# Patient Record
Sex: Female | Born: 1970 | Race: White | Hispanic: No | Marital: Married | Smoking: Never smoker
Health system: Southern US, Community
[De-identification: ages and names within clinical notes are randomized; demographics above are authoritative.]

## PROBLEM LIST (undated history)

## (undated) DIAGNOSIS — G43909 Migraine, unspecified, not intractable, without status migrainosus: Secondary | ICD-10-CM

## (undated) DIAGNOSIS — Z87442 Personal history of urinary calculi: Secondary | ICD-10-CM

## (undated) DIAGNOSIS — I10 Essential (primary) hypertension: Secondary | ICD-10-CM

## (undated) DIAGNOSIS — E785 Hyperlipidemia, unspecified: Secondary | ICD-10-CM

## (undated) DIAGNOSIS — R7303 Prediabetes: Secondary | ICD-10-CM

## (undated) DIAGNOSIS — F419 Anxiety disorder, unspecified: Secondary | ICD-10-CM

## (undated) DIAGNOSIS — G4730 Sleep apnea, unspecified: Secondary | ICD-10-CM

## (undated) HISTORY — PX: NO PAST SURGERIES: SHX2092

## (undated) HISTORY — DX: Hyperlipidemia, unspecified: E78.5

## (undated) HISTORY — DX: Migraine, unspecified, not intractable, without status migrainosus: G43.909

## (undated) HISTORY — DX: Essential (primary) hypertension: I10

---

## 2005-12-10 DIAGNOSIS — F419 Anxiety disorder, unspecified: Secondary | ICD-10-CM | POA: Insufficient documentation

## 2005-12-10 DIAGNOSIS — G43109 Migraine with aura, not intractable, without status migrainosus: Secondary | ICD-10-CM | POA: Insufficient documentation

## 2006-06-13 ENCOUNTER — Ambulatory Visit: Payer: Self-pay | Admitting: Family Medicine

## 2008-08-12 ENCOUNTER — Ambulatory Visit: Payer: Self-pay | Admitting: Cardiology

## 2011-03-18 ENCOUNTER — Ambulatory Visit: Payer: Self-pay | Admitting: Family Medicine

## 2011-04-26 ENCOUNTER — Other Ambulatory Visit: Payer: Self-pay | Admitting: Family Medicine

## 2011-12-10 ENCOUNTER — Other Ambulatory Visit: Payer: Self-pay | Admitting: Family Medicine

## 2011-12-10 LAB — COMPREHENSIVE METABOLIC PANEL
Albumin: 3.8 g/dL (ref 3.4–5.0)
Alkaline Phosphatase: 67 U/L (ref 50–136)
Anion Gap: 8 (ref 7–16)
BUN: 15 mg/dL (ref 7–18)
Bilirubin,Total: 0.6 mg/dL (ref 0.2–1.0)
Calcium, Total: 8.2 mg/dL — ABNORMAL LOW (ref 8.5–10.1)
Creatinine: 0.75 mg/dL (ref 0.60–1.30)
EGFR (African American): >60
EGFR (Non-African Amer.): >60
Osmolality: 283 (ref 275–301)
SGOT(AST): 14 U/L — ABNORMAL LOW (ref 15–37)
SGPT (ALT): 20 U/L
Total Protein: 7 g/dL (ref 6.4–8.2)

## 2011-12-10 LAB — CBC WITH DIFFERENTIAL/PLATELET
Eosinophil #: 0.1 10*3/uL (ref 0.0–0.7)
HCT: 39.1 % (ref 35.0–47.0)
Lymphocyte #: 1.7 10*3/uL (ref 1.0–3.6)
MCH: 29.8 pg (ref 26.0–34.0)
Monocyte %: 7.8 %
Neutrophil #: 3.4 10*3/uL (ref 1.4–6.5)
Neutrophil %: 61 %
Platelet: 243 10*3/uL (ref 150–440)
RBC: 4.37 10*6/uL (ref 3.80–5.20)
RDW: 13.6 % (ref 11.5–14.5)

## 2012-05-29 ENCOUNTER — Other Ambulatory Visit: Payer: Self-pay

## 2012-07-24 ENCOUNTER — Ambulatory Visit: Payer: Self-pay | Admitting: Family Medicine

## 2013-09-07 ENCOUNTER — Ambulatory Visit (INDEPENDENT_AMBULATORY_CARE_PROVIDER_SITE_OTHER): Payer: 59 | Admitting: General Surgery

## 2013-09-07 ENCOUNTER — Encounter: Payer: Self-pay | Admitting: General Surgery

## 2013-09-07 VITALS — BP 124/78 | Ht 63.0 in | Wt 156.0 lb

## 2013-09-07 DIAGNOSIS — K644 Residual hemorrhoidal skin tags: Secondary | ICD-10-CM

## 2013-09-07 NOTE — Progress Notes (Signed)
Patient ID: Audrey Koch, female   DOB: 19-Aug-1970, 43 y.o.   MRN: [id number]  Chief Complaint  Patient presents with  . Other    hemorrhoids    HPI Audrey Koch is a 42 y.o. female here today for an evaluation of hemorrhoids. Patient states she noticed this problem about 6 months ago. She states she has been bleeding and itchy/irritating. Pain and swelling has occurred. Occasionally painful during a bowel movement. Patient has used stool suppositories and a Lidocaine/Hydrocortisone cream without success.   HPI  Past Medical History  Diagnosis Date  . Migraine     History reviewed. No pertinent past surgical history.  History reviewed. No pertinent family history.  Social History History  Substance Use Topics  . Smoking status: Never Smoker   . Smokeless tobacco: Never Used  . Alcohol Use: No    Allergies  Allergen Reactions  . Sulfa Antibiotics Rash    Current Outpatient Prescriptions  Medication Sig Dispense Refill  . ibuprofen (ADVIL,MOTRIN) 200 MG tablet Take 200 mg by mouth every 6 (six) hours as needed.      . lidocaine (LMX) 4 % cream Apply 1 application topically as needed.       No current facility-administered medications for this visit.    Review of Systems Review of Systems  Constitutional: Negative.   Respiratory: Negative.   Cardiovascular: Negative.   Gastrointestinal: Positive for anal bleeding and rectal pain. Negative for nausea, vomiting, abdominal pain, diarrhea, constipation, blood in stool and abdominal distention.    Blood pressure 124/78, height 5\' 3"  (1.6 m), weight 156 lb (70.761 kg), last menstrual period 08/30/2013.  Physical Exam Physical Exam  Constitutional: She is oriented to person, place, and time. She appears well-developed and well-nourished.  Eyes: Conjunctivae are normal. No scleral icterus.  Neck: Neck supple.  Cardiovascular: Normal rate, regular rhythm and normal heart sounds.   Pulmonary/Chest: Effort normal and breath  sounds normal.  Abdominal: Soft. Normal appearance and bowel sounds are normal. There is no tenderness.  Genitourinary: Rectal exam shows external hemorrhoid (12 o clock, 2cm long).  Neurological: She is alert and oriented to person, place, and time.  Skin: Skin is warm and dry.    Data Reviewed None  Assessment    Symptomatic external hemorrhoid. Given the large size it has caused considerable problems trying to wipe and keep the area clean.     Plan     Reccommended excision. Discussed fully. At time of surgery anal speculum exam can be completed and if internal hemorrhoids are seen they can be removed also.    Patient to check her schedule and will call the office when ready to arrange.    Rajohn Henery G 09/07/2013, 4:06 PM

## 2013-09-07 NOTE — Patient Instructions (Addendum)
Schedule excision of external hemorrhoid.   Patient to check her schedule and will call the office when ready to arrange.

## 2013-09-27 ENCOUNTER — Telehealth: Payer: Self-pay

## 2013-09-27 ENCOUNTER — Other Ambulatory Visit: Payer: Self-pay | Admitting: General Surgery

## 2013-09-27 DIAGNOSIS — K644 Residual hemorrhoidal skin tags: Secondary | ICD-10-CM

## 2013-09-27 NOTE — Telephone Encounter (Signed)
Patient called to schedule her hemorrhoid surgery. Patient is scheduled for surgery on 10/08/13 at [facility]. She will phone interview with pre admitting testing. She has been given all instructions and is aware of date. She will call with any questions or changes in medications or health.

## 2013-10-08 ENCOUNTER — Ambulatory Visit: Payer: Self-pay | Admitting: General Surgery

## 2013-10-08 DIAGNOSIS — K649 Unspecified hemorrhoids: Secondary | ICD-10-CM

## 2013-10-08 HISTORY — PX: HEMORROIDECTOMY: SUR656

## 2013-10-11 ENCOUNTER — Encounter: Payer: Self-pay | Admitting: General Surgery

## 2013-10-11 LAB — PATHOLOGY REPORT

## 2013-10-13 ENCOUNTER — Encounter: Payer: Self-pay | Admitting: General Surgery

## 2013-10-13 ENCOUNTER — Telehealth: Payer: Self-pay

## 2013-10-13 NOTE — Telephone Encounter (Signed)
She states temp now 99.5. Denies nausea, vomiting, diarrhea, constipation, or a cough.  Advised to use ice to the area evening hours. alteranate tylenol with ibuprofen. She is aware to avoid constipation and take miralax as needed. She will call back if new symptoms arise or worsens.

## 2013-10-13 NOTE — Telephone Encounter (Signed)
Patient called concerned. States that she had hemorrhoid surgery last Friday and has still been having some bloody discharge. She also reports a temperature of 100.3 today.

## 2013-10-19 ENCOUNTER — Encounter: Payer: Self-pay | Admitting: General Surgery

## 2013-10-19 ENCOUNTER — Ambulatory Visit (INDEPENDENT_AMBULATORY_CARE_PROVIDER_SITE_OTHER): Payer: 59 | Admitting: General Surgery

## 2013-10-19 VITALS — BP 118/68 | HR 74 | Resp 12 | Ht 63.0 in | Wt 157.0 lb

## 2013-10-19 DIAGNOSIS — K644 Residual hemorrhoidal skin tags: Secondary | ICD-10-CM

## 2013-10-19 NOTE — Progress Notes (Signed)
This is a 43 year old female here today for her post op external hemorrhoidectomy done on 10/08/13. Patient states she is still having alittle drainage. Rectal exam shows hemorrhoidectomy site at 12o'clock with no signs of infection, swelling, or bleeding.  Overall, good healing.  Follow up in one month.

## 2013-10-19 NOTE — Patient Instructions (Addendum)
Patient to return in one month. 

## 2013-11-18 ENCOUNTER — Ambulatory Visit (INDEPENDENT_AMBULATORY_CARE_PROVIDER_SITE_OTHER): Payer: 59 | Admitting: General Surgery

## 2013-11-18 ENCOUNTER — Encounter: Payer: Self-pay | Admitting: General Surgery

## 2013-11-18 VITALS — BP 148/84 | HR 80 | Resp 12 | Ht 63.0 in | Wt 153.0 lb

## 2013-11-18 DIAGNOSIS — K644 Residual hemorrhoidal skin tags: Secondary | ICD-10-CM

## 2013-11-18 NOTE — Progress Notes (Signed)
This is a 43 year old female here today for her post op external hemorrhoidectomy done on 10/08/13. Patient states the area is not draining. She is overall feeling much better.  Rectal area  is clean and well healed. No residual hemorrhoid noted.  Patient to return as needed.

## 2013-11-18 NOTE — Patient Instructions (Addendum)
Patient to return as needed. 

## 2013-11-20 ENCOUNTER — Encounter: Payer: Self-pay | Admitting: General Surgery

## 2014-05-30 ENCOUNTER — Encounter: Payer: Self-pay | Admitting: General Surgery

## 2014-07-15 ENCOUNTER — Ambulatory Visit: Payer: Self-pay | Admitting: Orthopedic Surgery

## 2014-07-27 ENCOUNTER — Encounter: Payer: Self-pay | Admitting: Orthopedic Surgery

## 2014-07-29 ENCOUNTER — Encounter: Payer: Self-pay | Admitting: Orthopedic Surgery

## 2014-07-29 HISTORY — PX: FINGER FRACTURE SURGERY: SHX638

## 2014-08-29 ENCOUNTER — Encounter: Payer: Self-pay | Admitting: Orthopedic Surgery

## 2014-09-27 ENCOUNTER — Encounter
Admit: 2014-09-27 | Disposition: A | Discharge status: Home / Self Care | Payer: Self-pay | Attending: Orthopedic Surgery | Admitting: Orthopedic Surgery

## 2014-10-28 ENCOUNTER — Encounter
Admit: 2014-10-28 | Disposition: A | Discharge status: Home / Self Care | Payer: Self-pay | Attending: Orthopedic Surgery | Admitting: Orthopedic Surgery

## 2014-11-19 NOTE — Op Note (Signed)
PATIENT NAMEKATERIN, [name] MR#:  [id number] DATE OF BIRTH:  [birth year]  DATE OF PROCEDURE:  10/08/2013  PREOPERATIVE DIAGNOSIS:  External hemorrhoid.   POSTOPERATIVE DIAGNOSIS:  Internal and external hemorrhoid.   OPERATION PERFORMED:  Hemorrhoidectomy, single column, internal and external hemorrhoid.   SURGEON:  Mckinley Jewel, M.D.   ANESTHESIA:  General.   COMPLICATIONS:  None.   ESTIMATED BLOOD LOSS:  Nil.   DRAINS:  None.   DESCRIPTION OF PROCEDURE:  The patient was put to sleep in the supine position on the operating table using an LMA.  The patient was then placed in the lithotomy position and the anal area was prepped and draped out a sterile field.  The patient had a very large external hemorrhoid located at the 12 o'clock position.  Anal exam was completed including the speculum showing no abnormality elsewhere.  This hemorrhoid actually had an internal component to it and had failed to respond to any kind of local therapy.  The area was then infiltrated with 20 mL of 1.3% Exparel and the hemorrhoid was adequately exposed and along the base of this from the outer towards the inner aspect, the LigaSure device was employed serially and then cut and removed.  A complete seal was obtained.  The area was then covered with bacitracin ointment and dressed.  The procedure was well tolerated.  She was subsequently returned to the recovery room in stable condition.    ____________________________ S.Robinette Haines, MD sgs:ea D: 10/08/2013 15:23:05 ET T: 10/08/2013 23:02:32 ET JOB#: [id number]  cc: Synthia Innocent. Jamal Collin, MD, <Dictator> [facility] Robinette Haines MD ELECTRONICALLY SIGNED 10/12/2013 7:15

## 2015-01-11 DIAGNOSIS — L659 Nonscarring hair loss, unspecified: Secondary | ICD-10-CM | POA: Insufficient documentation

## 2015-01-11 DIAGNOSIS — K644 Residual hemorrhoidal skin tags: Secondary | ICD-10-CM | POA: Insufficient documentation

## 2015-01-11 DIAGNOSIS — E559 Vitamin D deficiency, unspecified: Secondary | ICD-10-CM | POA: Insufficient documentation

## 2015-01-11 DIAGNOSIS — Z87442 Personal history of urinary calculi: Secondary | ICD-10-CM | POA: Insufficient documentation

## 2015-01-11 DIAGNOSIS — B351 Tinea unguium: Secondary | ICD-10-CM | POA: Insufficient documentation

## 2015-01-11 DIAGNOSIS — S2249XA Multiple fractures of ribs, unspecified side, initial encounter for closed fracture: Secondary | ICD-10-CM | POA: Insufficient documentation

## 2015-01-11 DIAGNOSIS — Z202 Contact with and (suspected) exposure to infections with a predominantly sexual mode of transmission: Secondary | ICD-10-CM | POA: Insufficient documentation

## 2015-01-11 DIAGNOSIS — R635 Abnormal weight gain: Secondary | ICD-10-CM | POA: Insufficient documentation

## 2015-01-11 DIAGNOSIS — Z79899 Other long term (current) drug therapy: Secondary | ICD-10-CM | POA: Insufficient documentation

## 2015-01-11 DIAGNOSIS — S42009A Fracture of unspecified part of unspecified clavicle, initial encounter for closed fracture: Secondary | ICD-10-CM | POA: Insufficient documentation

## 2015-01-11 DIAGNOSIS — R61 Generalized hyperhidrosis: Secondary | ICD-10-CM | POA: Insufficient documentation

## 2015-02-10 ENCOUNTER — Ambulatory Visit (INDEPENDENT_AMBULATORY_CARE_PROVIDER_SITE_OTHER): Payer: 59 | Admitting: Physician Assistant

## 2015-02-10 ENCOUNTER — Telehealth: Payer: Self-pay | Admitting: Cardiovascular Disease

## 2015-02-10 ENCOUNTER — Encounter: Payer: Self-pay | Admitting: Physician Assistant

## 2015-02-10 VITALS — BP 118/82 | HR 77 | Temp 98.5°F | Resp 16 | Ht 63.0 in | Wt 156.0 lb

## 2015-02-10 DIAGNOSIS — Z0000 Encounter for general adult medical examination without abnormal findings: Secondary | ICD-10-CM

## 2015-02-10 DIAGNOSIS — Z8249 Family history of ischemic heart disease and other diseases of the circulatory system: Secondary | ICD-10-CM

## 2015-02-10 DIAGNOSIS — R9431 Abnormal electrocardiogram [ECG] [EKG]: Secondary | ICD-10-CM

## 2015-02-10 DIAGNOSIS — Z8639 Personal history of other endocrine, nutritional and metabolic disease: Secondary | ICD-10-CM

## 2015-02-10 DIAGNOSIS — Z833 Family history of diabetes mellitus: Secondary | ICD-10-CM

## 2015-02-10 DIAGNOSIS — R079 Chest pain, unspecified: Secondary | ICD-10-CM | POA: Diagnosis not present

## 2015-02-10 DIAGNOSIS — Z1331 Encounter for screening for depression: Secondary | ICD-10-CM

## 2015-02-10 DIAGNOSIS — R002 Palpitations: Secondary | ICD-10-CM | POA: Diagnosis not present

## 2015-02-10 DIAGNOSIS — Z124 Encounter for screening for malignant neoplasm of cervix: Secondary | ICD-10-CM | POA: Diagnosis not present

## 2015-02-10 DIAGNOSIS — Z1389 Encounter for screening for other disorder: Secondary | ICD-10-CM | POA: Diagnosis not present

## 2015-02-10 DIAGNOSIS — Z1239 Encounter for other screening for malignant neoplasm of breast: Secondary | ICD-10-CM

## 2015-02-10 NOTE — Telephone Encounter (Signed)
Sarah at [facility] called to see if patient can be seen any sooner as she is still c/o cp on exertion palpitations and family hx of early age CAD.  Patient wants to ONLY see Dr. Fletcher Anon.  Dr. Fletcher Anon is not in office today and does not have an appt available.  Judson Roch was advised to have the patient go to the emergency room.  Judson Roch would like Dr. Fletcher Anon to look at Patient's EKG.  Please call office or patient if we can do something any sooner.

## 2015-02-10 NOTE — Patient Instructions (Signed)
Health Maintenance Adopting a healthy lifestyle and getting preventive care can go a long way to promote health and wellness. Talk with your health care provider about what schedule of regular examinations is right for you. This is a good chance for you to check in with your provider about disease prevention and staying healthy. In between checkups, there are plenty of things you can do on your own. Experts have done a lot of research about which lifestyle changes and preventive measures are most likely to keep you healthy. Ask your health care provider for more information. WEIGHT AND DIET  Eat a healthy diet 1. Be sure to include plenty of vegetables, fruits, low-fat dairy products, and lean protein. 2. Do not eat a lot of foods high in solid fats, added sugars, or salt. 3. Get regular exercise. This is one of the most important things you can do for your health. 1. Most adults should exercise for at least 150 minutes each week. The exercise should increase your heart rate and make you sweat (moderate-intensity exercise). 2. Most adults should also do strengthening exercises at least twice a week. This is in addition to the moderate-intensity exercise.  Maintain a healthy weight 1. Body mass index (BMI) is a measurement that can be used to identify possible weight problems. It estimates body fat based on height and weight. Your health care provider can help determine your BMI and help you achieve or maintain a healthy weight. 2. For females 6 years of age and older:  1. A BMI below 18.5 is considered underweight. 2. A BMI of 18.5 to 24.9 is normal. 3. A BMI of 25 to 29.9 is considered overweight. 4. A BMI of 30 and above is considered obese.  Watch levels of cholesterol and blood lipids 1. You should start having your blood tested for lipids and cholesterol at 45 years of age, then have this test every 5 years. 2. You may need to have your cholesterol levels checked more often if: 1. Your  lipid or cholesterol levels are high. 2. You are older than 45 years of age. 3. You are at high risk for heart disease.  CANCER SCREENING   Lung Cancer 1. Lung cancer screening is recommended for adults 7-87 years old who are at high risk for lung cancer because of a history of smoking. 2. A yearly low-dose CT scan of the lungs is recommended for people who: 1. Currently smoke. 2. Have quit within the past 15 years. 3. Have at least a 30-pack-year history of smoking. A pack year is smoking an average of one pack of cigarettes a day for 1 year. 3. Yearly screening should continue until it has been 15 years since you quit. 4. Yearly screening should stop if you develop a health problem that would prevent you from having lung cancer treatment.  Breast Cancer  Practice breast self-awareness. This means understanding how your breasts normally appear and feel.  It also means doing regular breast self-exams. Let your health care provider know about any changes, no matter how small.  If you are in your 20s or 30s, you should have a clinical breast exam (CBE) by a health care provider every 1-3 years as part of a regular health exam.  If you are 30 or older, have a CBE every year. Also consider having a breast X-Madlock (mammogram) every year.  If you have a family history of breast cancer, talk to your health care provider about genetic screening.  If you are  at high risk for breast cancer, talk to your health care provider about having an MRI and a mammogram every year.  Breast cancer gene (BRCA) assessment is recommended for women who have family members with BRCA-related cancers. BRCA-related cancers include:  Breast.  Ovarian.  Tubal.  Peritoneal cancers.  Results of the assessment will determine the need for genetic counseling and BRCA1 and BRCA2 testing. Cervical Cancer Routine pelvic examinations to screen for cervical cancer are no longer recommended for nonpregnant women who  are considered low risk for cancer of the pelvic organs (ovaries, uterus, and vagina) and who do not have symptoms. A pelvic examination may be necessary if you have symptoms including those associated with pelvic infections. Ask your health care provider if a screening pelvic exam is right for you.   The Pap test is the screening test for cervical cancer for women who are considered at risk.  If you had a hysterectomy for a problem that was not cancer or a condition that could lead to cancer, then you no longer need Pap tests.  If you are older than 65 years, and you have had normal Pap tests for the past 10 years, you no longer need to have Pap tests.  If you have had past treatment for cervical cancer or a condition that could lead to cancer, you need Pap tests and screening for cancer for at least 20 years after your treatment.  If you no longer get a Pap test, assess your risk factors if they change (such as having a new sexual partner). This can affect whether you should start being screened again.  Some women have medical problems that increase their chance of getting cervical cancer. If this is the case for you, your health care provider may recommend more frequent screening and Pap tests.  The human papillomavirus (HPV) test is another test that may be used for cervical cancer screening. The HPV test looks for the virus that can cause cell changes in the cervix. The cells collected during the Pap test can be tested for HPV.  The HPV test can be used to screen women 2 years of age and older. Getting tested for HPV can extend the interval between normal Pap tests from three to five years.  An HPV test also should be used to screen women of any age who have unclear Pap test results.  After 45 years of age, women should have HPV testing as often as Pap tests.  Colorectal Cancer  This type of cancer can be detected and often prevented.  Routine colorectal cancer screening usually  begins at 45 years of age and continues through 45 years of age.  Your health care provider may recommend screening at an earlier age if you have risk factors for colon cancer.  Your health care provider may also recommend using home test kits to check for hidden blood in the stool.  A small camera at the end of a tube can be used to examine your colon directly (sigmoidoscopy or colonoscopy). This is done to check for the earliest forms of colorectal cancer.  Routine screening usually begins at age 57.  Direct examination of the colon should be repeated every 5-10 years through 45 years of age. However, you may need to be screened more often if early forms of precancerous polyps or small growths are found. Skin Cancer  Check your skin from head to toe regularly.  Tell your health care provider about any new moles or changes in  moles, especially if there is a change in a mole's shape or color.  Also tell your health care provider if you have a mole that is larger than the size of a pencil eraser.  Always use sunscreen. Apply sunscreen liberally and repeatedly throughout the day.  Protect yourself by wearing long sleeves, pants, a wide-brimmed hat, and sunglasses whenever you are outside. HEART DISEASE, DIABETES, AND HIGH BLOOD PRESSURE   Have your blood pressure checked at least every 1-2 years. High blood pressure causes heart disease and increases the risk of stroke.  If you are between 32 years and 30 years old, ask your health care provider if you should take aspirin to prevent strokes.  Have regular diabetes screenings. This involves taking a blood sample to check your fasting blood sugar level.  If you are at a normal weight and have a low risk for diabetes, have this test once every three years after 45 years of age.  If you are overweight and have a high risk for diabetes, consider being tested at a younger age or more often. PREVENTING INFECTION  Hepatitis B  If you have a  higher risk for hepatitis B, you should be screened for this virus. You are considered at high risk for hepatitis B if:  You were born in a country where hepatitis B is common. Ask your health care provider which countries are considered high risk.  Your parents were born in a high-risk country, and you have not been immunized against hepatitis B (hepatitis B vaccine).  You have HIV or AIDS.  You use needles to inject street drugs.  You live with someone who has hepatitis B.  You have had sex with someone who has hepatitis B.  You get hemodialysis treatment.  You take certain medicines for conditions, including cancer, organ transplantation, and autoimmune conditions. Hepatitis C  Blood testing is recommended for:  Everyone born from [birth year] through 1966.  Anyone with known risk factors for hepatitis C. Sexually transmitted infections (STIs)  You should be screened for sexually transmitted infections (STIs) including gonorrhea and chlamydia if:  You are sexually active and are younger than 45 years of age.  You are older than 45 years of age and your health care provider tells you that you are at risk for this type of infection.  Your sexual activity has changed since you were last screened and you are at an increased risk for chlamydia or gonorrhea. Ask your health care provider if you are at risk.  If you do not have HIV, but are at risk, it may be recommended that you take a prescription medicine daily to prevent HIV infection. This is called pre-exposure prophylaxis (PrEP). You are considered at risk if:  You are sexually active and do not regularly use condoms or know the HIV status of your partner(s).  You take drugs by injection.  You are sexually active with a partner who has HIV. Talk with your health care provider about whether you are at high risk of being infected with HIV. If you choose to begin PrEP, you should first be tested for HIV. You should then be tested  every 3 months for as long as you are taking PrEP.  PREGNANCY   If you are premenopausal and you may become pregnant, ask your health care provider about preconception counseling.  If you may become pregnant, take 400 to 800 micrograms (mcg) of folic acid every day.  If you want to prevent pregnancy, talk to your  health care provider about birth control (contraception). OSTEOPOROSIS AND MENOPAUSE   Osteoporosis is a disease in which the bones lose minerals and strength with aging. This can result in serious bone fractures. Your risk for osteoporosis can be identified using a bone density scan.  If you are 34 years of age or older, or if you are at risk for osteoporosis and fractures, ask your health care provider if you should be screened.  Ask your health care provider whether you should take a calcium or vitamin D supplement to lower your risk for osteoporosis.  Menopause may have certain physical symptoms and risks.  Hormone replacement therapy may reduce some of these symptoms and risks. Talk to your health care provider about whether hormone replacement therapy is right for you.  HOME CARE INSTRUCTIONS   Schedule regular health, dental, and eye exams.  Stay current with your immunizations.   Do not use any tobacco products including cigarettes, chewing tobacco, or electronic cigarettes.  If you are pregnant, do not drink alcohol.  If you are breastfeeding, limit how much and how often you drink alcohol.  Limit alcohol intake to no more than 1 drink per day for nonpregnant women. One drink equals 12 ounces of beer, 5 ounces of wine, or 1 ounces of hard liquor.  Do not use street drugs.  Do not share needles.  Ask your health care provider for help if you need support or information about quitting drugs.  Tell your health care provider if you often feel depressed.  Tell your health care provider if you have ever been abused or do not feel safe at home. Document  Released: 01/28/2011 Document Revised: 11/29/2013 Document Reviewed: 06/16/2013 [place] [facility] Patient Information 2015 [place], [place]. This information is not intended to replace advice given to you by your health care provider. Make sure you discuss any questions you have with your health care provider.     Why follow it? Research shows. . Those who follow the Mediterranean diet have a reduced risk of heart disease  . The diet is associated with a reduced incidence of Parkinson's and Alzheimer's diseases . People following the diet may have longer life expectancies and lower rates of chronic diseases  . The Dietary Guidelines for Americans recommends the Mediterranean diet as an eating plan to promote health and prevent disease  What Is the Mediterranean Diet?  . Healthy eating plan based on typical foods and recipes of Mediterranean-style cooking . The diet is primarily a plant based diet; these foods should make up a majority of meals   Starches - Plant based foods should make up a majority of meals - They are an important sources of vitamins, minerals, energy, antioxidants, and fiber - Choose whole grains, foods high in fiber and minimally processed items  - Typical grain sources include wheat, oats, barley, corn, brown rice, bulgar, farro, millet, polenta, couscous  - Various types of beans include chickpeas, lentils, fava beans, black beans, white beans   Fruits  Veggies - Large quantities of antioxidant rich fruits & veggies; 6 or more servings  - Vegetables can be eaten raw or lightly drizzled with oil and cooked  - Vegetables common to the traditional Mediterranean Diet include: artichokes, arugula, beets, broccoli, brussel sprouts, cabbage, carrots, celery, collard greens, cucumbers, eggplant, kale, leeks, lemons, lettuce, mushrooms, okra, onions, peas, peppers, potatoes, pumpkin, radishes, rutabaga, shallots, spinach, sweet potatoes, turnips, zucchini - Fruits common to the Mediterranean  Diet include: apples, apricots, avocados, cherries, clementines, dates, figs, grapefruits,  grapes, melons, nectarines, oranges, peaches, pears, pomegranates, strawberries, tangerines  Fats - Replace butter and margarine with healthy oils, such as olive oil, canola oil, and tahini  - Limit nuts to no more than a handful a day  - Nuts include walnuts, almonds, pecans, pistachios, pine nuts  - Limit or avoid candied, honey roasted or heavily salted nuts - Olives are central to the Mediterranean diet - can be eaten whole or used in a variety of dishes   Meats Protein - Limiting red meat: no more than a few times a month - When eating red meat: choose lean cuts and keep the portion to the size of deck of cards - Eggs: approx. 0 to 4 times a week  - Fish and lean poultry: at least 2 a week  - Healthy protein sources include, chicken, [place], lean beef, lamb - Increase intake of seafood such as tuna, salmon, trout, mackerel, shrimp, scallops - Avoid or limit high fat processed meats such as sausage and bacon  Dairy - Include moderate amounts of low fat dairy products  - Focus on healthy dairy such as fat free yogurt, skim milk, low or reduced fat cheese - Limit dairy products higher in fat such as whole or 2% milk, cheese, ice cream  Alcohol - Moderate amounts of red wine is ok  - No more than 5 oz daily for women (all ages) and men older than age 74  - No more than 10 oz of wine daily for men younger than 44  Other - Limit sweets and other desserts  - Use herbs and spices instead of salt to flavor foods  - Herbs and spices common to the traditional Mediterranean Diet include: basil, bay leaves, chives, cloves, cumin, fennel, garlic, lavender, marjoram, mint, oregano, parsley, pepper, rosemary, sage, savory, sumac, tarragon, thyme   It's not just a diet, it's a lifestyle:  . The Mediterranean diet includes lifestyle factors typical of those in the region  . Foods, drinks and meals are best eaten  with others and savored . Daily physical activity is important for overall good health . This could be strenuous exercise like running and aerobics . This could also be more leisurely activities such as walking, housework, yard-work, or taking the stairs . Moderation is the key; a balanced and healthy diet accommodates most foods and drinks . Consider portion sizes and frequency of consumption of certain foods   Meal Ideas & Options:  . Breakfast:  o Whole wheat toast or whole wheat English muffins with peanut butter & hard boiled egg o Steel cut oats topped with apples & cinnamon and skim milk  o Fresh fruit: banana, strawberries, melon, berries, peaches  o Smoothies: strawberries, bananas, greek yogurt, peanut butter o Low fat greek yogurt with blueberries and granola  o Egg white omelet with spinach and mushrooms o Breakfast couscous: whole wheat couscous, apricots, skim milk, cranberries  . Sandwiches:  o Hummus and grilled vegetables (peppers, zucchini, squash) on whole wheat bread   o Grilled chicken on whole wheat pita with lettuce, tomatoes, cucumbers or tzatziki  o Tuna salad on whole wheat bread: tuna salad made with greek yogurt, olives, red peppers, capers, green onions o Garlic rosemary lamb pita: lamb sauted with garlic, rosemary, salt & pepper; add lettuce, cucumber, greek yogurt to pita - flavor with lemon juice and black pepper  . Seafood:  o Mediterranean grilled salmon, seasoned with garlic, basil, parsley, lemon juice and black pepper o Shrimp, lemon, and spinach  whole-grain pasta salad made with low fat greek yogurt  o Seared scallops with lemon orzo  o Seared tuna steaks seasoned salt, pepper, coriander topped with tomato mixture of olives, tomatoes, olive oil, minced garlic, parsley, green onions and cappers  . Meats:  o Herbed greek chicken salad with kalamata olives, cucumber, feta  o Red bell peppers stuffed with spinach, bulgur, lean ground beef (or lentils) &  topped with feta   o Kebabs: skewers of chicken, tomatoes, onions, zucchini, squash  o [place] burgers: made with red onions, mint, dill, lemon juice, feta cheese topped with roasted red peppers . Vegetarian o Cucumber salad: cucumbers, artichoke hearts, celery, red onion, feta cheese, tossed in olive oil & lemon juice  o Hummus and whole grain pita points with a greek salad (lettuce, tomato, feta, olives, cucumbers, red onion) o Lentil soup with celery, carrots made with vegetable broth, garlic, salt and pepper  o Tabouli salad: parsley, bulgur, mint, scallions, cucumbers, tomato, radishes, lemon juice, olive oil, salt and pepper.      American Heart Association (AHA) Exercise Recommendation  Being physically active is important to prevent heart disease and stroke, the nation's No. 1and No. 5killers. To improve overall cardiovascular health, we suggest at least 150 minutes per week of moderate exercise or 75 minutes per week of vigorous exercise (or a combination of moderate and vigorous activity). Thirty minutes a day, five times a week is an easy goal to remember. You will also experience benefits even if you divide your time into two or three segments of 10 to 15 minutes per day.  For people who would benefit from lowering their blood pressure or cholesterol, we recommend 40 minutes of aerobic exercise of moderate to vigorous intensity three to four times a week to lower the risk for heart attack and stroke.  Physical activity is anything that makes you move your body and burn calories.  This includes things like climbing stairs or playing sports. Aerobic exercises benefit your heart, and include walking, jogging, swimming or biking. Strength and stretching exercises are best for overall stamina and flexibility.  The simplest, positive change you can make to effectively improve your heart health is to start walking. It's enjoyable, free, easy, social and great exercise. A walking program is  flexible and boasts high success rates because people can stick with it. It's easy for walking to become a regular and satisfying part of life.   For Overall Cardiovascular Health:  At least 30 minutes of moderate-intensity aerobic activity at least 5 days per week for a total of 150  OR   At least 25 minutes of vigorous aerobic activity at least 3 days per week for a total of 75 minutes; or a combination of moderate- and vigorous-intensity aerobic activity  AND   Moderate- to high-intensity muscle-strengthening activity at least 2 days per week for additional health benefits.  For Lowering Blood Pressure and Cholesterol  An average 40 minutes of moderate- to vigorous-intensity aerobic activity 3 or 4 times per week  What if I can't make it to the time goal? Something is always better than nothing! And everyone has to start somewhere. Even if you've been sedentary for years, today is the day you can begin to make healthy changes in your life. If you don't think you'll make it for 30 or 40 minutes, set a reachable goal for today. You can work up toward your overall goal by increasing your time as you get stronger. Don't let all-or-nothing thinking  rob you of doing what you can every day.  Source:http://www.heart.org

## 2015-02-10 NOTE — Telephone Encounter (Signed)
Forward to [facility] for EKG review

## 2015-02-10 NOTE — Telephone Encounter (Signed)
I could not open the ECG to read. Add her to my schedule Monday at 10:30.

## 2015-02-10 NOTE — Telephone Encounter (Signed)
Left detailed message with appt time/date on pt cell and work phone. Requested pt to CB to confirm receipt.  EKG printed for Dr. Tyrell Antonio review
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
## 2015-02-10 NOTE — Telephone Encounter (Signed)
Patient confirmed appt and will be here on Monday 10:15 Thanks!!

## 2015-02-11 LAB — CBC WITH DIFFERENTIAL/PLATELET
BASOS: 0 %
Basophils Absolute: 0 10*3/uL (ref 0.0–0.2)
EOS (ABSOLUTE): 0.1 10*3/uL (ref 0.0–0.4)
Eos: 1 %
HEMATOCRIT: 42.5 % (ref 34.0–46.6)
Hemoglobin: 14.1 g/dL (ref 11.1–15.9)
Immature Grans (Abs): 0 10*3/uL (ref 0.0–0.1)
Immature Granulocytes: 0 %
LYMPHS: 25 %
Lymphocytes Absolute: 1.7 10*3/uL (ref 0.7–3.1)
MCH: 29 pg (ref 26.6–33.0)
MCHC: 33.2 g/dL (ref 31.5–35.7)
MCV: 87 fL (ref 79–97)
MONOCYTES: 9 %
Monocytes Absolute: 0.6 10*3/uL (ref 0.1–0.9)
Neutrophils Absolute: 4.3 10*3/uL (ref 1.4–7.0)
Neutrophils: 65 %
Platelets: 316 10*3/uL (ref 150–379)
RBC: 4.86 x10E6/uL (ref 3.77–5.28)
RDW: 13.5 % (ref 12.3–15.4)
WBC: 6.7 10*3/uL (ref 3.4–10.8)

## 2015-02-11 LAB — COMPREHENSIVE METABOLIC PANEL
A/G RATIO: 1.8 (ref 1.1–2.5)
ALBUMIN: 4.2 g/dL (ref 3.5–5.5)
ALT: 22 IU/L (ref 0–32)
AST: 14 IU/L (ref 0–40)
Alkaline Phosphatase: 78 IU/L (ref 39–117)
BUN / CREAT RATIO: 21 (ref 9–23)
BUN: 16 mg/dL (ref 6–24)
Bilirubin Total: 0.5 mg/dL (ref 0.0–1.2)
CALCIUM: 9.1 mg/dL (ref 8.7–10.2)
CO2: 22 mmol/L (ref 18–29)
CREATININE: 0.77 mg/dL (ref 0.57–1.00)
Chloride: 100 mmol/L (ref 97–108)
GFR calc Af Amer: 108 mL/min/{1.73_m2} (ref >59)
GFR calc non Af Amer: 94 mL/min/{1.73_m2} (ref >59)
GLOBULIN, TOTAL: 2.4 g/dL (ref 1.5–4.5)
GLUCOSE: 87 mg/dL (ref 65–99)
Potassium: 4.3 mmol/L (ref 3.5–5.2)
Sodium: 139 mmol/L (ref 134–144)
TOTAL PROTEIN: 6.6 g/dL (ref 6.0–8.5)

## 2015-02-11 LAB — LIPID PANEL
CHOLESTEROL TOTAL: 200 mg/dL — AB (ref 100–199)
Chol/HDL Ratio: 3.5 ratio units (ref 0.0–4.4)
HDL: 57 mg/dL (ref >39)
LDL Calculated: 128 mg/dL — ABNORMAL HIGH (ref 0–99)
Triglycerides: 75 mg/dL (ref 0–149)
VLDL Cholesterol Cal: 15 mg/dL (ref 5–40)

## 2015-02-11 LAB — HEMOGLOBIN A1C
Est. average glucose Bld gHb Est-mCnc: 111 mg/dL
HEMOGLOBIN A1C: 5.5 % (ref 4.8–5.6)

## 2015-02-11 LAB — TSH: TSH: 2.04 u[IU]/mL (ref 0.450–4.500)

## 2015-02-13 ENCOUNTER — Ambulatory Visit (INDEPENDENT_AMBULATORY_CARE_PROVIDER_SITE_OTHER): Payer: 59 | Admitting: Cardiovascular Disease

## 2015-02-13 ENCOUNTER — Encounter: Payer: Self-pay | Admitting: Cardiovascular Disease

## 2015-02-13 VITALS — BP 118/80 | HR 82 | Ht 63.0 in | Wt 155.5 lb

## 2015-02-13 DIAGNOSIS — R0789 Other chest pain: Secondary | ICD-10-CM | POA: Diagnosis not present

## 2015-02-13 DIAGNOSIS — R079 Chest pain, unspecified: Secondary | ICD-10-CM | POA: Diagnosis not present

## 2015-02-13 DIAGNOSIS — R002 Palpitations: Secondary | ICD-10-CM | POA: Diagnosis not present

## 2015-02-13 NOTE — Patient Instructions (Signed)
Medication Instructions:  Your physician recommends that you continue on your current medications as directed. Please refer to the Current Medication list given to you today.   Labwork: none  Testing/Procedures: Your physician has recommended that you wear a holter monitor. Holter monitors are medical devices that record the heart's electrical activity. Doctors most often use these monitors to diagnose arrhythmias. Arrhythmias are problems with the speed or rhythm of the heartbeat. The monitor is a small, portable device. You can wear one while you do your normal daily activities. This is usually used to diagnose what is causing palpitations/syncope (passing out).  Your physician has requested that you have a stress echocardiogram. For further information please visit [email or web address]. Please follow instruction sheet as given.    Follow-Up: Your physician recommends that you schedule a follow-up appointment with Dr. Fletcher Anon as needed.    Any Other Special Instructions Will Be Listed Below (If Applicable).  Exercise Stress Echocardiogram An exercise stress echocardiogram is a heart (cardiac) test used to check the function of your heart. This test may also be called an exercise stress echocardiography or stress echo. This stress test will check how well your heart muscle and valves are working and determine if your heart muscle is getting enough blood. You will exercise on a treadmill to naturally increase or stress the functioning of your heart.  An echocardiogram uses sound waves (ultrasound) to produce an image of your heart. If your heart does not work normally, it may indicate coronary artery disease with poor coronary blood supply. The coronary arteries are the arteries that bring blood and oxygen to your heart. LET [facility] CARE PROVIDER KNOW ABOUT:  Any allergies you have.  All medicines you are taking, including vitamins, herbs, eye drops, creams, and over-the-counter  medicines.  Previous problems you or members of your family have had with the use of anesthetics.  Any blood disorders you have.  Previous surgeries you have had.  Medical conditions you have.  Possibility of pregnancy, if this applies. RISKS AND COMPLICATIONS Generally, this is a safe procedure. However, as with any procedure, complications can occur. Possible complications can include:  You develop pain or pressure in the following areas:  Chest.  Jaw or neck.  Between your shoulder blades.  Radiating down your left arm.  Dizziness or lightheadedness.  Shortness of breath.  Increased or irregular heartbeat.  Nausea or vomiting.  Heart attack (rare). BEFORE THE PROCEDURE  Avoid all forms of caffeine for 24 hours before your test or as directed by your health care provider. This includes coffee, tea (even decaffeinated tea), caffeinated sodas, chocolate, cocoa, and certain pain medicines.  Follow your health care provider's instructions regarding eating and drinking before the test.  Take your medicines as directed at regular times with water unless instructed otherwise. Exceptions may include:  If you have diabetes, ask how you are to take your insulin or pills. It is common to adjust insulin dosing the morning of the test.  If you are taking beta-blocker medicines, it is important to talk to your health care provider about these medicines well before the date of your test. Taking beta-blocker medicines may interfere with the test. In some cases, these medicines need to be changed or stopped 24 hours or more before the test.  If you wear a nitroglycerin patch, it may need to be removed prior to the test. Ask your health care provider if the patch should be removed before the test.  If you use an inhaler  for any breathing condition, bring it with you to the test.  If you are an outpatient, bring a snack so you can eat right after the stress phase of the test.  Do  not smoke for 4 hours prior to the test or as directed by your health care provider.  Wear loose-fitting clothes and comfortable shoes for the test. This test involves walking on a treadmill. PROCEDURE   Multiple electrodes will be put on your chest. If needed, small areas of your chest may be shaved to get better contact with the electrodes. Once the electrodes are attached to your body, multiple wires will be attached to the electrodes, and your heart rate will be monitored.  You will have an echocardiogram done at rest.  To produce this image of your heart, gel is applied to your chest, and a wand-like tool (transducer) is moved over the chest. The transducer sends the sound waves through the chest to create the moving images of your heart.  You may need an IV to receive a medication that improves the quality of the pictures.  You will then walk on a treadmill. The treadmill will be started at a slow pace. The treadmill speed and incline will gradually be increased to raise your heart rate.  At the peak of exercise, the treadmill will be stopped. You will lie down immediately on a bed so that a second echocardiogram can be done to visualize your heart's motion with exercise.  The test usually takes 30-60 minutes to complete. AFTER THE PROCEDURE  Your heart rate and blood pressure will be monitored after the test.  You may return to your normal schedule, including diet, activities, and medicines, unless your health care provider tells you otherwise. Document Released: 07/19/2004 Document Revised: 07/20/2013 Document Reviewed: 03/22/2013 [facility] Patient Information 2015 [place], [place]. This information is not intended to replace advice given to you by your health care provider. Make sure you discuss any questions you have with your health care provider. Cardiac Event Monitoring A cardiac event monitor is a small recording device used to help detect abnormal heart rhythms (arrhythmias).  The monitor is used to record heart rhythm when noticeable symptoms such as the following occur:  Fast heartbeats (palpitations), such as heart racing or fluttering.  Dizziness.  Fainting or light-headedness.  Unexplained weakness. The monitor is wired to two electrodes placed on your chest. Electrodes are flat, sticky disks that attach to your skin. The monitor can be worn for up to 30 days. You will wear the monitor at all times, except when bathing.  HOW TO USE YOUR CARDIAC EVENT MONITOR A technician will prepare your chest for the electrode placement. The technician will show you how to place the electrodes, how to work the monitor, and how to replace the batteries. Take time to practice using the monitor before you leave the office. Make sure you understand how to send the information from the monitor to your health care provider. This requires a telephone with a landline, not a cell phone. You need to:  Wear your monitor at all times, except when you are in water:  Do not get the monitor wet.  Take the monitor off when bathing. Do not swim or use a hot tub with it on.  Keep your skin clean. Do not put body lotion or moisturizer on your chest.  Change the electrodes daily or any time they stop sticking to your skin. You might need to use tape to keep them on.  It is possible that your skin under the electrodes could become irritated. To keep this from happening, try to put the electrodes in slightly different places on your chest. However, they must remain in the area under your left breast and in the upper right section of your chest.  Make sure the monitor is safely clipped to your clothing or in a location close to your body that your health care provider recommends.  Press the button to record when you feel symptoms of heart trouble, such as dizziness, weakness, light-headedness, palpitations, thumping, shortness of breath, unexplained weakness, or a fluttering or racing heart.  The monitor is always on and records what happened slightly before you pressed the button, so do not worry about being too late to get good information.  Keep a diary of your activities, such as walking, doing chores, and taking medicine. It is especially important to note what you were doing when you pushed the button to record your symptoms. This will help your health care provider determine what might be contributing to your symptoms. The information stored in your monitor will be reviewed by your health care provider alongside your diary entries.  Send the recorded information as recommended by your health care provider. It is important to understand that it will take some time for your health care provider to process the results.  Change the batteries as recommended by your health care provider. SEEK IMMEDIATE MEDICAL CARE IF:   You have chest pain.  You have extreme difficulty breathing or shortness of breath.  You develop a very fast heartbeat that persists.  You develop dizziness that does not go away.  You faint or constantly feel you are about to faint. Document Released: 04/23/2008 Document Revised: 11/29/2013 Document Reviewed: 01/11/2013 [facility] Patient Information 2015 [place], [place]. This information is not intended to replace advice given to you by your health care provider. Make sure you discuss any questions you have with your health care provider.

## 2015-02-13 NOTE — Assessment & Plan Note (Signed)
Likely due to PVCs and PACs noted on rhythm strips. I requested a 24-hour Holter monitor to quantify the amount and consider treatment if excessive.

## 2015-02-13 NOTE — Assessment & Plan Note (Signed)
The chest pain is overall atypical but it's happening in the recovery time of exercise which might indicate the presence of arrhythmia. Risk factors include family history of coronary artery disease and mild hyperlipidemia. I requested evaluation with a stress echocardiogram. Given the presence of PVCs, a treadmill stress test alone is probably not sufficient.

## 2015-02-13 NOTE — Progress Notes (Signed)
Primary care physician: Dr. Venia Minks  HPI  This is a pleasant 44 year old female who was referred for evaluation of palpitations and atypical chest pain. She has no previous cardiac history. She reports previous history of panic attacks with cardiac evaluation done with Dr.Fath many years ago which was unremarkable. She has no chronic medical conditions other than mild hyperlipidemia. She does have family history of premature coronary artery disease. Her mother had myocardial infarction at the age of 71 but she was a smoker. 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 She exercises on a regular basis mostly weight and interval training. She does not do much cardio. She had recent episodes of chest pain under the left breast described as aching sensation which usually happens after exercise and not during exercise. This is usually brief. She denies any shortness of breath. She was noted to have an irregular pulse and a rhythm strip showed some PVCs and PACs. She does complain of mild palpitations especially when she is under stress. There is no excessive caffeine use. Recent labs were unremarkable including thyroid function.  Allergies  Allergen Reactions  . Sulfa Antibiotics Rash     Current Outpatient Prescriptions on File Prior to Visit  Medication Sig Dispense Refill  . ibuprofen (ADVIL,MOTRIN) 200 MG tablet Take 200 mg by mouth every 6 (six) hours as needed.     No current facility-administered medications on file prior to visit.     Past Medical History  Diagnosis Date  . Migraine   . Hyperlipidemia      Past Surgical History  Procedure Laterality Date  . Hemorroidectomy  10/08/13  . No past surgeries       Family History  Problem Relation Age of Onset  . Heart attack Mother   . Hyperlipidemia Mother   . Dementia Maternal Grandmother   . Diabetes Maternal Grandfather   . Dementia Paternal Grandmother   . Diabetes Paternal Grandfather   . Esophageal  cancer Paternal Grandfather   . Diabetes Brother      History   Social History  . Marital Status: Married    Spouse Name: N/A  . Number of Children: N/A  . Years of Education: N/A   Occupational History  . Not on file.   Social History Main Topics  . Smoking status: Never Smoker   . Smokeless tobacco: Never Used  . Alcohol Use: No  . Drug Use: No  . Sexual Activity: Not on file   Other Topics Concern  . Not on file   Social History Narrative     ROS A 10 point review of system was performed. It is negative other than that mentioned in the history of present illness.   PHYSICAL EXAM   BP 118/80 mmHg  Pulse 82  Ht 5\' 3"  (1.6 m)  Wt 155 lb 8 oz (70.534 kg)  BMI 27.55 kg/m2 Constitutional: She is oriented to person, place, and time. She appears well-developed and well-nourished. No distress.  HENT: No nasal discharge.  Head: Normocephalic and atraumatic.  Eyes: Pupils are equal and round. No discharge.  Neck: Normal range of motion. Neck supple. No JVD present. No thyromegaly present.  Cardiovascular: Normal rate, regular rhythm, normal heart sounds. Exam reveals no gallop and no friction rub. No murmur heard.  Pulmonary/Chest: Effort normal and breath sounds normal. No stridor. No respiratory distress. She has no wheezes. She has no rales. She exhibits no tenderness.  Abdominal: Soft. Bowel sounds are normal. She exhibits no  distension. There is no tenderness. There is no rebound and no guarding.  Musculoskeletal: Normal range of motion. She exhibits no edema and no tenderness.  Neurological: She is alert and oriented to person, place, and time. Coordination normal.  Skin: Skin is warm and dry. No rash noted. She is not diaphoretic. No erythema. No pallor.  Psychiatric: She has a normal mood and affect. Her behavior is normal. Judgment and thought content normal.     EKG: Sinus  Rhythm  Diffuse low voltage.   ABNORMAL     ASSESSMENT AND PLAN

## 2015-02-14 LAB — PAP IG W/ RFLX HPV ASCU: PAP Smear Comment: 0

## 2015-02-15 ENCOUNTER — Ambulatory Visit
Admission: RE | Admit: 2015-02-15 | Discharge: 2015-02-15 | Disposition: A | Discharge status: Home / Self Care | Payer: 59 | Source: Ambulatory Visit | Attending: Cardiovascular Disease | Admitting: Cardiovascular Disease

## 2015-02-15 DIAGNOSIS — R002 Palpitations: Secondary | ICD-10-CM | POA: Diagnosis present

## 2015-02-28 ENCOUNTER — Ambulatory Visit (INDEPENDENT_AMBULATORY_CARE_PROVIDER_SITE_OTHER): Payer: 59

## 2015-02-28 DIAGNOSIS — R002 Palpitations: Secondary | ICD-10-CM

## 2015-02-28 DIAGNOSIS — R079 Chest pain, unspecified: Secondary | ICD-10-CM

## 2015-03-02 ENCOUNTER — Telehealth: Payer: Self-pay | Admitting: Cardiovascular Disease

## 2015-03-02 NOTE — Telephone Encounter (Signed)
Patient had holter and stress echo wants results please call with results or timeframe for when results will be ready.

## 2015-03-02 NOTE — Telephone Encounter (Signed)
Her stress echo was normal, but I dont' see that her holter has been read or is in Dr. Tyrell Antonio box to interpret.

## 2015-03-02 NOTE — Telephone Encounter (Signed)
Spoke w/ pt.  Advised her of stress echo results.  Advised her that I will ask Christell Faith, PA to review her holter and see if we can give her prelim results before the weekend.

## 2015-03-07 NOTE — Telephone Encounter (Signed)
See Holter result note.

## 2015-03-08 NOTE — Telephone Encounter (Signed)
Reviewed results with pt. See "Result Note"

## 2015-03-17 ENCOUNTER — Ambulatory Visit: Payer: Self-pay | Admitting: Cardiovascular Disease

## 2016-01-28 ENCOUNTER — Emergency Department
Admission: EM | Admit: 2016-01-28 | Discharge: 2016-01-28 | Disposition: A | Discharge status: Home / Self Care | Payer: Managed Care, Other (non HMO) | Attending: Emergency Medicine | Admitting: Emergency Medicine

## 2016-01-28 ENCOUNTER — Encounter: Payer: Self-pay | Admitting: *Deleted

## 2016-01-28 ENCOUNTER — Emergency Department: Payer: Managed Care, Other (non HMO)

## 2016-01-28 DIAGNOSIS — Y929 Unspecified place or not applicable: Secondary | ICD-10-CM | POA: Insufficient documentation

## 2016-01-28 DIAGNOSIS — S6991XA Unspecified injury of right wrist, hand and finger(s), initial encounter: Secondary | ICD-10-CM | POA: Diagnosis present

## 2016-01-28 DIAGNOSIS — W2107XA Struck by softball, initial encounter: Secondary | ICD-10-CM | POA: Insufficient documentation

## 2016-01-28 DIAGNOSIS — S62632A Displaced fracture of distal phalanx of right middle finger, initial encounter for closed fracture: Secondary | ICD-10-CM | POA: Insufficient documentation

## 2016-01-28 DIAGNOSIS — E785 Hyperlipidemia, unspecified: Secondary | ICD-10-CM | POA: Insufficient documentation

## 2016-01-28 DIAGNOSIS — Y9364 Activity, baseball: Secondary | ICD-10-CM | POA: Insufficient documentation

## 2016-01-28 DIAGNOSIS — Y999 Unspecified external cause status: Secondary | ICD-10-CM | POA: Diagnosis not present

## 2016-01-28 DIAGNOSIS — S62639A Displaced fracture of distal phalanx of unspecified finger, initial encounter for closed fracture: Secondary | ICD-10-CM

## 2016-01-28 DIAGNOSIS — S66801A Unspecified injury of other specified muscles, fascia and tendons at wrist and hand level, right hand, initial encounter: Secondary | ICD-10-CM

## 2016-01-28 MED ORDER — IBUPROFEN 600 MG PO TABS: 600.0000 mg | ORAL_TABLET | Freq: Four times a day (QID) | ORAL | Status: DC | PRN

## 2016-01-28 MED ORDER — NAPROXEN 500 MG PO TABS
500.0000 mg | ORAL_TABLET | Freq: Once | ORAL | Status: AC
  Administered 2016-01-28: 500 mg via ORAL
  Filled 2016-01-28: qty 1

## 2016-01-28 MED ORDER — TRAMADOL HCL 50 MG PO TABS
50.0000 mg | ORAL_TABLET | Freq: Four times a day (QID) | ORAL | Status: DC | PRN
Start: 2016-01-28 — End: 2016-07-18

## 2016-01-28 NOTE — ED Notes (Signed)
Pt was walking her dog the ran ran, leash pulled pt's fingers back on right hand

## 2016-01-28 NOTE — ED Provider Notes (Signed)
[facility] Emergency Department Provider Note ____________________________________________  Time seen: Approximately 4:44 PM  I have reviewed the triage vital signs and the nursing notes.   HISTORY  Chief Complaint Finger Injury    HPI Audrey Koch is a 45 y.o. female who presents to the emergency department for evaluation of an injury to her right hand. While at a softball game, she had her dog on his leash and the ball was hit toward the fence. The dog "bolted" when he saw the players running toward the fence and pulled/twisted her right hand. Her middle finger was flexed on her palm and she had to lift it back up. Her ring finger is now deformed above the DIP.  Past Medical History  Diagnosis Date  . Migraine   . Hyperlipidemia     Patient Active Problem List   Diagnosis Date Noted  . Atypical chest pain 02/13/2015  . Palpitations 02/13/2015  . Abnormal weight gain 01/11/2015  . Closed fracture of multiple ribs 01/11/2015  . Closed fracture of clavicle 01/11/2015  . Contact with and suspected exposure to infections with predominantly sexual mode of transmission 01/11/2015  . External hemorrhoid 01/11/2015  . Alopecia 01/11/2015  . Polypharmacy 01/11/2015  . H/O renal calculi 01/11/2015  . Fungal infection of nail 01/11/2015  . Night sweat 01/11/2015  . Avitaminosis D 01/11/2015  . Anxiety 12/10/2005  . Acute onset aura migraine 12/10/2005    Past Surgical History  Procedure Laterality Date  . Hemorroidectomy  10/08/13  . No past surgeries      Current Outpatient Rx  Name  Route  Sig  Dispense  Refill  . ibuprofen (ADVIL,MOTRIN) 600 MG tablet   Oral   Take 1 tablet (600 mg total) by mouth every 6 (six) hours as needed.   30 tablet   0   . traMADol (ULTRAM) 50 MG tablet   Oral   Take 1 tablet (50 mg total) by mouth every 6 (six) hours as needed.   20 tablet   0     Allergies Sulfa antibiotics  Family History  Problem  Relation Age of Onset  . Heart attack Mother   . Hyperlipidemia Mother   . Dementia Maternal Grandmother   . Diabetes Maternal Grandfather   . Dementia Paternal Grandmother   . Diabetes Paternal Grandfather   . Esophageal cancer Paternal Grandfather   . Diabetes Brother     Social History Social History  Substance Use Topics  . Smoking status: Never Smoker   . Smokeless tobacco: Never Used  . Alcohol Use: No    Review of Systems Constitutional: No recent illness. Cardiovascular: Denies chest pain or palpitations. Respiratory: Denies shortness of breath. Musculoskeletal: Pain in right hand Skin: Negative for rash, wound, lesion. Neurological: Negative for focal weakness or numbness.  ____________________________________________   PHYSICAL EXAM:  VITAL SIGNS: ED Triage Vitals  Enc Vitals Group     BP 01/28/16 1603 142/87 mmHg     Pulse Rate 01/28/16 1603 92     Resp 01/28/16 1603 20     Temp 01/28/16 1603 98.3 F (36.8 C)     Temp Source 01/28/16 1603 Oral     SpO2 01/28/16 1603 99 %     Weight 01/28/16 1603 155 lb (70.308 kg)     Height 01/28/16 1603 5\' 2"  (1.575 m)     Head Cir --      Peak Flow --      Pain Score 01/28/16 1600 7  Pain Loc --      Pain Edu? --      Excl. in [place]? --     Constitutional: Alert and oriented. Well appearing and in no acute distress. Eyes: Conjunctivae are normal. EOMI. Head: Atraumatic. Neck: No stridor.  Respiratory: Normal respiratory effort.   Musculoskeletal: Limited ROM at PIP of right long finger; No ROM of DIP of right ring finger that is deformed. Neurologic:  Normal speech and language. No gross focal neurologic deficits are appreciated. Speech is normal. No gait instability. Skin:  Skin is warm, dry and intact. Ecchymosis noted with edema of the PIP of the right long finger. Skin closed over the deformed DIP of the right ring finger. Psychiatric: Mood and affect are normal. Speech and behavior are  normal.  ____________________________________________   LABS (all labs ordered are listed, but only abnormal results are displayed)  Labs Reviewed - No data to display ____________________________________________  RADIOLOGY  Mildly displaced 4th middle phalanx fracture without dislocation. Persistently flexed 4th DIP concerning for ligamentous injury. ____________________________________________   PROCEDURES  Procedure(s) performed:  Aluminum foam splints applied to both the right middle and ring fingers in functional position, then buddy taped together. Neurovascularly intact post application.   ____________________________________________   INITIAL IMPRESSION / ASSESSMENT AND PLAN / ED COURSE  Pertinent labs & imaging results that were available during my care of the patient were reviewed by me and considered in my medical decision making (see chart for details).  Patient is to follow-up with orthopedics. She is to call tomorrow to schedule follow-up. She was advised to leave the splint in place. She was encouraged to take the tramadol and ibuprofen as prescribed if needed for pain. She was encouraged to return to the emergency department for symptoms that change or worsen if she is unable to schedule an appointment with her primary care provider or the specialist. ____________________________________________   FINAL CLINICAL IMPRESSION(S) / ED DIAGNOSES  Final diagnoses:  Injury of flexor tendon of hand, right, initial encounter  Fracture, finger, distal phalanx, closed, initial encounter       Victorino Dike, FNP 01/28/16 1921  Delman Kitten, MD 01/28/16 2355

## 2016-07-18 ENCOUNTER — Encounter: Payer: Self-pay | Admitting: Physician Assistant

## 2016-07-18 ENCOUNTER — Ambulatory Visit (INDEPENDENT_AMBULATORY_CARE_PROVIDER_SITE_OTHER): Payer: Managed Care, Other (non HMO) | Admitting: Physician Assistant

## 2016-07-18 VITALS — BP 138/92 | HR 92 | Temp 98.2°F | Resp 16 | Wt 171.0 lb

## 2016-07-18 DIAGNOSIS — Z1231 Encounter for screening mammogram for malignant neoplasm of breast: Secondary | ICD-10-CM

## 2016-07-18 DIAGNOSIS — R61 Generalized hyperhidrosis: Secondary | ICD-10-CM | POA: Diagnosis not present

## 2016-07-18 DIAGNOSIS — K5900 Constipation, unspecified: Secondary | ICD-10-CM | POA: Diagnosis not present

## 2016-07-18 DIAGNOSIS — I441 Atrioventricular block, second degree: Secondary | ICD-10-CM | POA: Diagnosis not present

## 2016-07-18 DIAGNOSIS — E559 Vitamin D deficiency, unspecified: Secondary | ICD-10-CM | POA: Diagnosis not present

## 2016-07-18 DIAGNOSIS — R635 Abnormal weight gain: Secondary | ICD-10-CM

## 2016-07-18 DIAGNOSIS — I491 Atrial premature depolarization: Secondary | ICD-10-CM

## 2016-07-18 DIAGNOSIS — Z1239 Encounter for other screening for malignant neoplasm of breast: Secondary | ICD-10-CM

## 2016-07-18 DIAGNOSIS — R002 Palpitations: Secondary | ICD-10-CM

## 2016-07-18 NOTE — Progress Notes (Signed)
Patient: Audrey Koch Female    DOB: Apr 22, 1971   45 y.o.   MRN: [id number] Visit Date: 07/18/2016  Today's Provider: Mar Daring, PA-C   Chief Complaint  Patient presents with  . Night Sweats   Subjective:    HPI   Night Sweats Pt reports she is experiencing night sweats (soaking through clothes), chills, weight gain, heart palpitations, constipation, insomnia, fatigue, headaches with nausea. Pt reports she has been experiencing these sx for several years, but has been worsening over the last 3-4 months. Pt reports her menses are still regular, lasting 4 days with heavy flow. Pt states there are no changes in her menses. Pt has not tried any OTC remedies for the sx.  She also has a history of palpitations and was found to have PACs. She has been seen by Dr. Fletcher Anon in the past (2016). This summer she had a fracture and prior to surgery the anesthesiologist told her he thought she had a Wenkebach rhythm. There is no documentation of this EKG noted in Epic.    Allergies  Allergen Reactions  . Sulfa Antibiotics Rash     Current Outpatient Prescriptions:  .  Cholecalciferol (VITAMIN D) 2000 units CAPS, Take by mouth., Disp: , Rfl:  .  ibuprofen (ADVIL,MOTRIN) 600 MG tablet, Take 1 tablet (600 mg total) by mouth every 6 (six) hours as needed., Disp: 30 tablet, Rfl: 0  Review of Systems  Constitutional: Positive for chills, diaphoresis, fatigue and unexpected weight change. Negative for activity change, appetite change and fever.  Respiratory: Negative for cough, chest tightness and shortness of breath.   Cardiovascular: Positive for palpitations. Negative for chest pain and leg swelling.  Gastrointestinal: Positive for abdominal distention and constipation. Negative for abdominal pain.  Skin:       Dry skin present  Neurological: Positive for headaches. Negative for dizziness, syncope, weakness and light-headedness.  Psychiatric/Behavioral: Positive for sleep  disturbance. Negative for agitation, decreased concentration and dysphoric mood. The patient is not nervous/anxious.     Social History  Substance Use Topics  . Smoking status: Never Smoker  . Smokeless tobacco: Never Used  . Alcohol use No   Objective:   BP (!) 138/92 (BP Location: Left Arm, Patient Position: Sitting, Cuff Size: Large)   Pulse 92   Temp 98.2 F (36.8 C) (Oral)   Resp 16   Wt 171 lb (77.6 kg)   LMP 07/17/2016   BMI 31.28 kg/m   Physical Exam  Constitutional: She appears well-developed and well-nourished. No distress.  Neck: Normal range of motion. Neck supple. No JVD present. No tracheal deviation present. No thyromegaly present.  Cardiovascular: Normal rate, regular rhythm and normal heart sounds.  Exam reveals no gallop and no friction rub.   No murmur heard. Pulmonary/Chest: Effort normal and breath sounds normal. No respiratory distress. She has no wheezes. She has no rales.  Lymphadenopathy:    She has no cervical adenopathy.  Skin: She is not diaphoretic.  Psychiatric: Her speech is normal and behavior is normal. Judgment and thought content normal. Her mood appears anxious. Cognition and memory are normal. She exhibits a depressed mood.  Vitals reviewed.     Assessment & Plan:     1. Night sweats Worsening symptoms. Will check labs as below and f/u pending labs. Discussed it may be perimenopausal symptoms and labs may not be 100% reliable since she is still having a menstrual cycle. She voiced understanding, but kept saying "I think  we are just missing something." I will follow up with her pending labs.  - CBC w/Diff/Platelet - Comprehensive Metabolic Panel (CMET) - Thyroid Panel With TSH - FSH - LH - Estradiol - B12 - Cortisol  2. Weight gain See above medical treatment plan. - CBC w/Diff/Platelet - Comprehensive Metabolic Panel (CMET) - Thyroid Panel With TSH - FSH - LH - Estradiol - Cortisol  3. Heart palpitations H/O PACs noted on  Holter monitor with Dr. Fletcher Anon in 2016. States she had surgery in summer 2017 and was told she had Wenkebach by anesthesia prior to surgery, because they kept watching the arrhythmia prior to putting her to sleep. Will check labs and refer back to Dr. Fletcher Anon to make verify since there is no record of the this in Epic.  - CBC w/Diff/Platelet - Comprehensive Metabolic Panel (CMET) - Thyroid Panel With TSH - B12 - Cortisol  4. PAC (premature atrial contraction) See above medical treatment plan. - CBC w/Diff/Platelet - Comprehensive Metabolic Panel (CMET) - Cortisol  5. Constipation, unspecified constipation type Will check labs as below and f/u pending results. - CBC w/Diff/Platelet - Comprehensive Metabolic Panel (CMET) - Cortisol  6. Breast cancer screening There is no family history of breast cancer. She does perform regular self breast exams. Mammogram was ordered as below. Information for [facility] clinic was given to patient so she may schedule her mammogram at her convenience. - MM Digital Screening; Future  7. Vitamin D deficiency H/O this. Will check labs as below and f/u pending results. - Vitamin D (25 hydroxy) - Cortisol  8. Wenckebach Will refer back to Dr. Fletcher Anon for further evaluation and possible verification.     Patient seen and examined by Mar Daring, PA-C, and note scribed by Renaldo Fiddler, CMA.  Mar Daring, PA-C  [facility]

## 2016-07-19 ENCOUNTER — Other Ambulatory Visit
Admission: RE | Admit: 2016-07-19 | Discharge: 2016-07-19 | Disposition: A | Discharge status: Home / Self Care | Payer: Managed Care, Other (non HMO) | Source: Ambulatory Visit | Attending: Physician Assistant | Admitting: Physician Assistant

## 2016-07-19 DIAGNOSIS — E559 Vitamin D deficiency, unspecified: Secondary | ICD-10-CM | POA: Insufficient documentation

## 2016-07-19 DIAGNOSIS — K5900 Constipation, unspecified: Secondary | ICD-10-CM | POA: Diagnosis not present

## 2016-07-19 DIAGNOSIS — R002 Palpitations: Secondary | ICD-10-CM | POA: Insufficient documentation

## 2016-07-19 DIAGNOSIS — R61 Generalized hyperhidrosis: Secondary | ICD-10-CM | POA: Diagnosis not present

## 2016-07-19 DIAGNOSIS — I491 Atrial premature depolarization: Secondary | ICD-10-CM | POA: Insufficient documentation

## 2016-07-19 DIAGNOSIS — R635 Abnormal weight gain: Secondary | ICD-10-CM | POA: Diagnosis present

## 2016-07-19 LAB — COMPREHENSIVE METABOLIC PANEL
ALK PHOS: 69 U/L (ref 38–126)
ALT: 19 U/L (ref 14–54)
ANION GAP: 7 (ref 5–15)
AST: 20 U/L (ref 15–41)
Albumin: 4.1 g/dL (ref 3.5–5.0)
BUN: 16 mg/dL (ref 6–20)
CALCIUM: 8.8 mg/dL — AB (ref 8.9–10.3)
CO2: 25 mmol/L (ref 22–32)
Chloride: 107 mmol/L (ref 101–111)
Creatinine, Ser: 0.81 mg/dL (ref 0.44–1.00)
GFR calc non Af Amer: >60 mL/min (ref >60)
Glucose, Bld: 96 mg/dL (ref 65–99)
Potassium: 3.5 mmol/L (ref 3.5–5.1)
SODIUM: 139 mmol/L (ref 135–145)
TOTAL PROTEIN: 7.3 g/dL (ref 6.5–8.1)
Total Bilirubin: 0.9 mg/dL (ref 0.3–1.2)

## 2016-07-19 LAB — CBC WITH DIFFERENTIAL/PLATELET
BASOS ABS: 0 10*3/uL (ref 0–0.1)
BASOS PCT: 0 %
Eosinophils Absolute: 0.1 10*3/uL (ref 0–0.7)
Eosinophils Relative: 1 %
HEMATOCRIT: 39.9 % (ref 35.0–47.0)
HEMOGLOBIN: 13.9 g/dL (ref 12.0–16.0)
Lymphocytes Relative: 24 %
Lymphs Abs: 1.5 10*3/uL (ref 1.0–3.6)
MCH: 30.1 pg (ref 26.0–34.0)
MCHC: 34.9 g/dL (ref 32.0–36.0)
MCV: 86.1 fL (ref 80.0–100.0)
MONOS PCT: 8 %
Monocytes Absolute: 0.5 10*3/uL (ref 0.2–0.9)
NEUTROS ABS: 4.2 10*3/uL (ref 1.4–6.5)
NEUTROS PCT: 67 %
Platelets: 273 10*3/uL (ref 150–440)
RBC: 4.63 MIL/uL (ref 3.80–5.20)
RDW: 13.2 % (ref 11.5–14.5)
WBC: 6.3 10*3/uL (ref 3.6–11.0)

## 2016-07-19 LAB — VITAMIN B12: Vitamin B-12: 384 pg/mL (ref 180–914)

## 2016-07-20 LAB — THYROID PANEL WITH TSH
Free Thyroxine Index: 2.2 (ref 1.2–4.9)
T3 UPTAKE RATIO: 28 % (ref 24–39)
T4 TOTAL: 7.9 ug/dL (ref 4.5–12.0)
TSH: 2.74 u[IU]/mL (ref 0.450–4.500)

## 2016-07-20 LAB — FSH/LH
FSH: 5.3 m[IU]/mL
LH: 4.7 m[IU]/mL

## 2016-07-20 LAB — VITAMIN D 25 HYDROXY (VIT D DEFICIENCY, FRACTURES): Vit D, 25-Hydroxy: 31.1 ng/mL (ref 30.0–100.0)

## 2016-07-25 ENCOUNTER — Telehealth: Payer: Self-pay

## 2016-07-25 LAB — ESTRADIOL: ESTRADIOL: 42.7 pg/mL

## 2016-07-25 MED ORDER — MAGNESIUM 250 MG PO TABS: 1.0000 | ORAL_TABLET | Freq: Every day | ORAL | 0 refills | Status: DC

## 2016-07-25 MED ORDER — VITAMIN D (ERGOCALCIFEROL) 1.25 MG (50000 UNIT) PO CAPS: 50000.0000 [IU] | ORAL_CAPSULE | ORAL | 1 refills | Status: DC

## 2016-07-25 NOTE — Telephone Encounter (Signed)
-----   Message from Mar Daring, [place] sent at 07/25/2016 10:08 AM EST ----- All labs are within normal limits and stable. Vit D and calcium are borderline low. May add calcium and Vit D supplement OTC. Estrogen, FSH, and LH are all normal. Could still be perimenopausal symptoms. Let me see you back in 4 weeks to discuss options and see if Vit D helps symptoms at all.  Thanks! -JB

## 2016-07-25 NOTE — Telephone Encounter (Signed)
Advised pt of lab results. Pt verbally acknowledges understanding. Pt requested high dose vitamin D. Was sent to pharmacy per verbal ok from [place]. Magnesium added to med list per verbal okay from [place] as well. FU scheduled. Renaldo Fiddler, CMA

## 2016-07-25 NOTE — Telephone Encounter (Signed)
lmtcb Audrey Koch, CMA  

## 2016-07-26 ENCOUNTER — Telehealth: Payer: Self-pay

## 2016-07-26 ENCOUNTER — Ambulatory Visit
Admission: RE | Admit: 2016-07-26 | Discharge: 2016-07-26 | Disposition: A | Discharge status: Home / Self Care | Payer: Managed Care, Other (non HMO) | Source: Ambulatory Visit | Attending: Physician Assistant | Admitting: Physician Assistant

## 2016-07-26 DIAGNOSIS — Z1231 Encounter for screening mammogram for malignant neoplasm of breast: Secondary | ICD-10-CM | POA: Diagnosis not present

## 2016-07-26 DIAGNOSIS — Z1239 Encounter for other screening for malignant neoplasm of breast: Secondary | ICD-10-CM

## 2016-07-26 NOTE — Telephone Encounter (Signed)
Patient advised as below.  

## 2016-07-26 NOTE — Telephone Encounter (Signed)
-----   Message from Mar Daring, [place] sent at 07/26/2016  4:15 PM EST ----- Normal mammogram. Repeat screening in one year.

## 2016-08-29 ENCOUNTER — Ambulatory Visit (INDEPENDENT_AMBULATORY_CARE_PROVIDER_SITE_OTHER): Payer: Managed Care, Other (non HMO) | Admitting: Physician Assistant

## 2016-08-29 ENCOUNTER — Encounter: Payer: Self-pay | Admitting: Physician Assistant

## 2016-08-29 VITALS — BP 138/70 | HR 94 | Temp 98.3°F | Resp 16 | Wt 176.4 lb

## 2016-08-29 DIAGNOSIS — E6609 Other obesity due to excess calories: Secondary | ICD-10-CM | POA: Diagnosis not present

## 2016-08-29 DIAGNOSIS — Z713 Dietary counseling and surveillance: Secondary | ICD-10-CM

## 2016-08-29 DIAGNOSIS — Z6832 Body mass index (BMI) 32.0-32.9, adult: Secondary | ICD-10-CM

## 2016-08-29 MED ORDER — PHENTERMINE HCL 37.5 MG PO CAPS: 37.5000 mg | ORAL_CAPSULE | ORAL | 0 refills | Status: DC

## 2016-08-29 NOTE — Patient Instructions (Signed)
Calorie Counting for Weight Loss Calories are energy you get from the things you eat and drink. Your body uses this energy to keep you going throughout the day. The number of calories you eat affects your weight. When you eat more calories than your body needs, your body stores the extra calories as fat. When you eat fewer calories than your body needs, your body burns fat to get the energy it needs. Calorie counting means keeping track of how many calories you eat and drink each day. If you make sure to eat fewer calories than your body needs, you should lose weight. In order for calorie counting to work, you will need to eat the number of calories that are right for you in a day to lose a healthy amount of weight per week. A healthy amount of weight to lose per week is usually 1-2 lb (0.5-0.9 kg). A dietitian can determine how many calories you need in a day and give you suggestions on how to reach your calorie goal.  WHAT IS MY MY PLAN? My goal is to have 1200 calories per day.  If I have this many calories per day, I should lose around 1-2 pounds per week. WHAT DO I NEED TO KNOW ABOUT CALORIE COUNTING? In order to meet your daily calorie goal, you will need to:  Find out how many calories are in each food you would like to eat. Try to do this before you eat.  Decide how much of the food you can eat.  Write down what you ate and how many calories it had. Doing this is called keeping a food log. WHERE DO I FIND CALORIE INFORMATION? The number of calories in a food can be found on a Nutrition Facts label. Note that all the information on a label is based on a specific serving of the food. If a food does not have a Nutrition Facts label, try to look up the calories online or ask your dietitian for help. HOW DO I DECIDE HOW MUCH TO EAT? To decide how much of the food you can eat, you will need to consider both the number of calories in one serving and the size of one serving. This information can be  found on the Nutrition Facts label. If a food does not have a Nutrition Facts label, look up the information online or ask your dietitian for help. Remember that calories are listed per serving. If you choose to have more than one serving of a food, you will have to multiply the calories per serving by the amount of servings you plan to eat. For example, the label on a package of bread might say that a serving size is 1 slice and that there are 90 calories in a serving. If you eat 1 slice, you will have eaten 90 calories. If you eat 2 slices, you will have eaten 180 calories. HOW DO I KEEP A FOOD LOG? After each meal, record the following information in your food log:  What you ate.  How much of it you ate.  How many calories it had.  Then, add up your calories. Keep your food log near you, such as in a small notebook in your pocket. Another option is to use a mobile app or website. Some programs will calculate calories for you and show you how many calories you have left each time you add an item to the log. WHAT ARE SOME CALORIE COUNTING TIPS?  Use your calories on foods   and drinks that will fill you up and not leave you hungry. Some examples of this include foods like nuts and nut butters, vegetables, lean proteins, and high-fiber foods (more than 5 g fiber per serving).  Eat nutritious foods and avoid empty calories. Empty calories are calories you get from foods or beverages that do not have many nutrients, such as candy and soda. It is better to have a nutritious high-calorie food (such as an avocado) than a food with few nutrients (such as a bag of chips).  Know how many calories are in the foods you eat most often. This way, you do not have to look up how many calories they have each time you eat them.  Look out for foods that may seem like low-calorie foods but are really high-calorie foods, such as baked goods, soda, and fat-free candy.  Pay attention to calories in drinks. Drinks  such as sodas, specialty coffee drinks, alcohol, and juices have a lot of calories yet do not fill you up. Choose low-calorie drinks like water and diet drinks.  Focus your calorie counting efforts on higher calorie items. Logging the calories in a garden salad that contains only vegetables is less important than calculating the calories in a milk shake.  Find a way of tracking calories that works for you. Get creative. Most people who are successful find ways to keep track of how much they eat in a day, even if they do not count every calorie. WHAT ARE SOME PORTION CONTROL TIPS?  Know how many calories are in a serving. This will help you know how many servings of a certain food you can have.  Use a measuring cup to measure serving sizes. This is helpful when you start out. With time, you will be able to estimate serving sizes for some foods.  Take some time to put servings of different foods on your favorite plates, bowls, and cups so you know what a serving looks like.  Try not to eat straight from a bag or box. Doing this can lead to overeating. Put the amount you would like to eat in a cup or on a plate to make sure you are eating the right portion.  Use smaller plates, glasses, and bowls to prevent overeating. This is a quick and easy way to practice portion control. If your plate is smaller, less food can fit on it.  Try not to multitask while eating, such as watching TV or using your computer. If it is time to eat, sit down at a table and enjoy your food. Doing this will help you to start recognizing when you are full. It will also make you more aware of what and how much you are eating. HOW CAN I CALORIE COUNT WHEN EATING OUT?  Ask for smaller portion sizes or child-sized portions.  Consider sharing an entree and sides instead of getting your own entree.  If you get your own entree, eat only half. Ask for a box at the beginning of your meal and put the rest of your entree in it so  you are not tempted to eat it.  Look for the calories on the menu. If calories are listed, choose the lower calorie options.  Choose dishes that include vegetables, fruits, whole grains, low-fat dairy products, and lean protein. Focusing on smart food choices from each of the 5 food groups can help you stay on track at restaurants.  Choose items that are boiled, broiled, grilled, or steamed.  Choose   water, milk, unsweetened iced tea, or other drinks without added sugars. If you want an alcoholic beverage, choose a lower calorie option. For example, a regular margarita can have up to 700 calories and a glass of wine has around 150.  Stay away from items that are buttered, battered, fried, or served with cream sauce. Items labeled "crispy" are usually fried, unless stated otherwise.  Ask for dressings, sauces, and syrups on the side. These are usually very high in calories, so do not eat much of them.  Watch out for salads. Many people think salads are a healthy option, but this is often not the case. Many salads come with bacon, fried chicken, lots of cheese, fried chips, and dressing. All of these items have a lot of calories. If you want a salad, choose a garden salad and ask for grilled meats or steak. Ask for the dressing on the side, or ask for olive oil and vinegar or lemon to use as dressing.  Estimate how many servings of a food you are given. For example, a serving of cooked rice is  cup or about the size of half a tennis ball or one cupcake wrapper. Knowing serving sizes will help you be aware of how much food you are eating at restaurants. The list below tells you how big or small some common portion sizes are based on everyday objects.  1 oz-4 stacked dice.  3 oz-1 deck of cards.  1 tsp-1 dice.  1 Tbsp- a Ping-Pong ball.  2 Tbsp-1 Ping-Pong ball.   cup-1 tennis ball or 1 cupcake wrapper.  1 cup-1 baseball. This information is not intended to replace advice given to you by  your health care provider. Make sure you discuss any questions you have with your health care provider. Document Released: 07/15/2005 Document Revised: 08/05/2014 Document Reviewed: 05/20/2013 Elsevier Interactive Patient Education  2017 [employer]. Phentermine tablets or capsules What is this medicine? PHENTERMINE (FEN ter meen) decreases your appetite. It is used with a reduced calorie diet and exercise to help you lose weight. This medicine may be used for other purposes; ask your health care provider or pharmacist if you have questions. COMMON BRAND NAME(S): Adipex-P, Atti-Plex P, Atti-Plex P Spansule, Fastin, Lomaira, Pro-Fast, Tara-8 What should I tell my health care provider before I take this medicine? They need to know if you have any of these conditions: -agitation -glaucoma -heart disease -high blood pressure -history of substance abuse -lung disease called Primary Pulmonary Hypertension (PPH) -taken an MAOI like Carbex, Eldepryl, Marplan, Nardil, or Parnate in last 14 days -thyroid disease -an unusual or allergic reaction to phentermine, other medicines, foods, dyes, or preservatives -pregnant or trying to get pregnant -breast-feeding How should I use this medicine? Take this medicine by mouth with a glass of water. Follow the directions on the prescription label. The instructions for use may differ based on the product and dose you are taking. Avoid taking this medicine in the evening. It may interfere with sleep. Take your doses at regular intervals. Do not take your medicine more often than directed. Talk to your pediatrician regarding the use of this medicine in children. While this drug may be prescribed for children 17 years or older for selected conditions, precautions do apply. Overdosage: If you think you have taken too much of this medicine contact a poison control center or emergency room at once. NOTE: This medicine is only for you. Do not share this medicine with  others. What if I miss a  dose? If you miss a dose, take it as soon as you can. If it is almost time for your next dose, take only that dose. Do not take double or extra doses. What may interact with this medicine? Do not take this medicine with any of the following medications: -duloxetine -MAOIs like Carbex, Eldepryl, Marplan, Nardil, and Parnate -medicines for colds or breathing difficulties like pseudoephedrine or phenylephrine -procarbazine -sibutramine -SSRIs like citalopram, escitalopram, fluoxetine, fluvoxamine, paroxetine, and sertraline -stimulants like dexmethylphenidate, methylphenidate or modafinil -venlafaxine This medicine may also interact with the following medications: -medicines for diabetes This list may not describe all possible interactions. Give your health care provider a list of all the medicines, herbs, non-prescription drugs, or dietary supplements you use. Also tell them if you smoke, drink alcohol, or use illegal drugs. Some items may interact with your medicine. What should I watch for while using this medicine? Notify your physician immediately if you become short of breath while doing your normal activities. Do not take this medicine within 6 hours of bedtime. It can keep you from getting to sleep. Avoid drinks that contain caffeine and try to stick to a regular bedtime every night. This medicine was intended to be used in addition to a healthy diet and exercise. The best results are achieved this way. This medicine is only indicated for short-term use. Eventually your weight loss may level out. At that point, the drug will only help you maintain your new weight. Do not increase or in any way change your dose without consulting your doctor. You may get drowsy or dizzy. Do not drive, use machinery, or do anything that needs mental alertness until you know how this medicine affects you. Do not stand or sit up quickly, especially if you are an older patient. This  reduces the risk of dizzy or fainting spells. Alcohol may increase dizziness and drowsiness. Avoid alcoholic drinks. What side effects may I notice from receiving this medicine? Side effects that you should report to your doctor or health care professional as soon as possible: -chest pain, palpitations -depression or severe changes in mood -increased blood pressure -irritability -nervousness or restlessness -severe dizziness -shortness of breath -problems urinating -unusual swelling of the legs -vomiting Side effects that usually do not require medical attention (report to your doctor or health care professional if they continue or are bothersome): -blurred vision or other eye problems -changes in sexual ability or desire -constipation or diarrhea -difficulty sleeping -dry mouth or unpleasant taste -headache -nausea This list may not describe all possible side effects. Call your doctor for medical advice about side effects. You may report side effects to FDA at 1-800-FDA-1088. Where should I keep my medicine? Keep out of the reach of children. This medicine can be abused. Keep your medicine in a safe place to protect it from theft. Do not share this medicine with anyone. Selling or giving away this medicine is dangerous and against the law. This medicine may cause accidental overdose and death if taken by other adults, children, or pets. Mix any unused medicine with a substance like cat litter or coffee grounds. Then throw the medicine away in a sealed container like a sealed bag or a coffee can with a lid. Do not use the medicine after the expiration date. Store at room temperature between 20 and 25 degrees C (68 and 77 degrees F). Keep container tightly closed. NOTE: This sheet is a summary. It may not cover all possible information. If you have questions about this  medicine, talk to your doctor, pharmacist, or health care provider.  2017 Elsevier/Gold Standard (2015-04-21 12:53:15)

## 2016-08-29 NOTE — Progress Notes (Signed)
Patient: Audrey Koch Female    DOB: 1970/08/24   46 y.o.   MRN: [id number] Visit Date: 08/29/2016  Today's Provider: Mar Daring, PA-C   Chief Complaint  Patient presents with  . Follow-up    Night Sweats   Subjective:    HPI  Patient is here today to follow-up night sweats and to discuss perimenopausal symptoms and if Vit D helped.   Per patient she feels a little better. Her weight still going up. She reports she is exercising 3 days a week (doing [employer]) and eating healthy. She does not keep a food diary but eats the same thing almost every day and tries to not eat large portions. She feels her weight is a huge issue for her and feeds into her fatigue and emotions.     Allergies  Allergen Reactions  . Sulfa Antibiotics Rash     Current Outpatient Prescriptions:  .  Cholecalciferol (VITAMIN D) 2000 units CAPS, Take by mouth., Disp: , Rfl:  .  ibuprofen (ADVIL,MOTRIN) 600 MG tablet, Take 1 tablet (600 mg total) by mouth every 6 (six) hours as needed., Disp: 30 tablet, Rfl: 0 .  Magnesium 250 MG TABS, Take 1 tablet (250 mg total) by mouth daily., Disp: 30 tablet, Rfl: 0 .  Vitamin D, Ergocalciferol, (DRISDOL) 50000 units CAPS capsule, Take 1 capsule (50,000 Units total) by mouth every 7 (seven) days., Disp: 4 capsule, Rfl: 1  Review of Systems  Constitutional: Positive for diaphoresis, fatigue and unexpected weight change.  Respiratory: Negative.   Cardiovascular: Positive for palpitations (much better). Negative for chest pain.  Gastrointestinal: Negative.   Neurological: Negative.   Psychiatric/Behavioral: Negative.     Social History  Substance Use Topics  . Smoking status: Never Smoker  . Smokeless tobacco: Never Used  . Alcohol use No   Objective:   BP 138/70 (BP Location: Right Arm, Patient Position: Sitting, Cuff Size: Normal)   Pulse 94   Temp 98.3 F (36.8 C) (Oral)   Resp 16   Wt 176 lb 6.4 oz (80 kg)   LMP 08/15/2016   BMI 32.26  kg/m   Physical Exam  Constitutional: She appears well-developed and well-nourished. No distress.  Neck: Normal range of motion. Neck supple.  Cardiovascular: Normal rate, regular rhythm and normal heart sounds.  Exam reveals no gallop and no friction rub.   No murmur heard. Pulmonary/Chest: Effort normal and breath sounds normal. No respiratory distress. She has no wheezes. She has no rales.  Skin: She is not diaphoretic.  Psychiatric: She has a normal mood and affect. Her behavior is normal. Judgment and thought content normal.  Vitals reviewed.      Assessment & Plan:     1. Encounter for weight loss counseling The patient has been trying to eat healthier and has been doing [employer] for 3 times per week. I will add phentermine as below for appetite suppression. I did encourage patient to start a food diary to make sure that she is getting at least 1000 cal per day but to try to limit to 1200 cal per day for her weight loss. She voices understanding and states that she will start my fitness pal. She has used this in the past successfully. I will see her back in 4 weeks to see how she is doing. - phentermine 37.5 MG capsule; Take 1 capsule (37.5 mg total) by mouth every morning.  Dispense: 30 capsule; Refill: 0  2.  Class 1 obesity due to excess calories without serious comorbidity with body mass index (BMI) of 32.0 to 32.9 in adult See above medical treatment plan. - phentermine 37.5 MG capsule; Take 1 capsule (37.5 mg total) by mouth every morning.  Dispense: 30 capsule; Refill: 0       Mar Daring, PA-C  [facility]

## 2016-09-06 ENCOUNTER — Telehealth: Payer: Self-pay | Admitting: Family Medicine

## 2016-09-06 NOTE — Telephone Encounter (Signed)
Pt called saying she would like to talk to Sonia Baller about her blood pressure medication  Please [phone]  Thank steri

## 2016-09-06 NOTE — Telephone Encounter (Signed)
Pt is concerned about Phentermine. BP has been elevated. Pt also reports that husband job lost his job, and she has had extra stress. She took Phentermine on Tuesday and woke up with migraine. BP at that time was 150/92. Pt did NOT take the medication on Wednesday, and her BP on Thursday morning was 128/80, but pt took phentermine that day and BP was 150/94 that evening. BP on Friday morning was 154/100. Pt is asking if she should D/C the phentermine, or could the elevated BP be coming from stress? And pt reports she does boot camp exercise class, and should she go to these classes with elevated BP. Please advise. Renaldo Fiddler, CMA

## 2016-09-06 NOTE — Telephone Encounter (Signed)
Pt advised. Agrees with treatment plan. Will D/C the phentermine and monitor BP. erythromycin d

## 2016-09-06 NOTE — Telephone Encounter (Signed)
Yes phentermine can elevate BP and she should D/C. She can exercise on days her BP is below 150/100. On days where it is close to that she should not exercise or go lightly. Stress could also cause but seems to be on days when she has taken phentermine.

## 2016-09-26 ENCOUNTER — Other Ambulatory Visit: Payer: Self-pay | Admitting: Physician Assistant

## 2016-09-27 ENCOUNTER — Encounter: Payer: Self-pay | Admitting: Physician Assistant

## 2016-09-27 ENCOUNTER — Ambulatory Visit (INDEPENDENT_AMBULATORY_CARE_PROVIDER_SITE_OTHER): Payer: 59 | Admitting: Physician Assistant

## 2016-09-27 VITALS — BP 152/90 | HR 97 | Temp 98.5°F | Resp 16 | Wt 174.6 lb

## 2016-09-27 DIAGNOSIS — F321 Major depressive disorder, single episode, moderate: Secondary | ICD-10-CM

## 2016-09-27 DIAGNOSIS — I10 Essential (primary) hypertension: Secondary | ICD-10-CM

## 2016-09-27 MED ORDER — FLUOXETINE HCL 10 MG PO TABS: 10.0000 mg | ORAL_TABLET | Freq: Every day | ORAL | 3 refills | Status: DC

## 2016-09-27 MED ORDER — TRIAMTERENE-HCTZ 37.5-25 MG PO TABS: 1.0000 | ORAL_TABLET | Freq: Every day | ORAL | 3 refills | Status: DC

## 2016-09-27 NOTE — Patient Instructions (Addendum)
Hydrochlorothiazide, HCTZ; Triamterene tablets or capsules What is this medicine? HYDROCHLOROTHIAZIDE; TRIAMTERENE (hye droe klor oh THYE a zide; trye AM ter een) is a diuretic. It helps you make more urine and lose the extra water from your body. This medicine is used to treat high blood pressure and edema or swelling from excess water. This medicine may be used for other purposes; ask your health care provider or pharmacist if you have questions. COMMON BRAND NAME(S): Dyazide, Maxzide What should I tell my health care provider before I take this medicine? They need to know if you have any of these conditions: -diabetes -immune system problems, like lupus -kidney disease or stones -liver disease -small amount of urine or difficulty passing urine -an unusual or allergic reaction to triamterene, hydrochlorothiazide, sulfa drugs, other medicines, foods, dyes, or preservatives -pregnant or trying to get pregnant -breast-feeding How should I use this medicine? Take this medicine by mouth with a glass of water. Follow the directions on your prescription label. Take your medicine at regular intervals. Do not take it more often than directed. Do not stop taking except on your doctor's advice. Remember that you will need to pass urine frequently after taking this medicine. Do not take your doses at a time of day that will cause you problems. Do not take at bedtime. Talk to your pediatrician regarding the use of this medicine in children. Special care may be needed. Overdosage: If you think you have taken too much of this medicine contact a poison control center or emergency room at once. NOTE: This medicine is only for you. Do not share this medicine with others. What if I miss a dose? If you miss a dose, take it as soon as you can. If it is almost time for your next dose, take only that dose. Do not take double or extra doses. What may interact with this medicine? Do not take this medicine with any  of the following medications: -eplerenone This medicine may also interact with the following medications: -cyclosporine -heart medicines like ACE inhibitors, digoxin, dofetilide, eplerenone, angiotensin II antagonists, and medicines for blood pressure -lithium -medicines for diabetes -medicines for inflammation like indomethacin -medicines that relax muscles for surgery -other diuretics -potassium -sotalol -tacrolimus This list may not describe all possible interactions. Give your health care provider a list of all the medicines, herbs, non-prescription drugs, or dietary supplements you use. Also tell them if you smoke, drink alcohol, or use illegal drugs. Some items may interact with your medicine. What should I watch for while using this medicine? Visit your doctor or health care professional for regular check ups. You will need lab work done before you start this medicine and regularly while you are taking it. Check your blood pressure regularly. Ask your health care professional what your blood pressure should be, and when you should contact them. If you are a diabetic, check your blood sugar as directed. Do not stop taking your medicine unless your doctor tells you to. You may need to be on a special diet while taking this medicine. Ask your doctor. Also, ask how many glasses of fluid you need to drink a day. You must not get dehydrated. You may get drowsy or dizzy. Do not drive, use machinery, or do anything that needs mental alertness until you know how this medicine affects you. Do not stand or sit up quickly, especially if you are an older patient. This reduces the risk of dizzy or fainting spells. Alcohol may interfere with the effect  of this medicine. Avoid or limit alcoholic drinks. This medicine can make you more sensitive to the sun. Keep out of the sun. If you cannot avoid being in the sun, wear protective clothing and use sunscreen. Do not use sun lamps or tanning beds/booths. What  side effects may I notice from receiving this medicine? Side effects that you should report to your doctor or health care professional as soon as possible: -allergic reactions such as skin rash or itching, hives, swelling of the lips, mouth, tongue, or throat -changes in vision -eye pain -fast or irregular heartbeat, chest pain -feeling faint or dizzy -gout attack -muscle pain or cramps -numbness or tingling in hands, feet, or lips -pain or difficulty when passing urine -redness, blistering, peeling or loosening of the skin, including inside the mouth -shortness of breath -unusually weak or tired Side effects that usually do not require medical attention (report to your doctor or health care professional if they continue or are bothersome): -change in sex drive or performance -dry mouth -headache -stomach upset This list may not describe all possible side effects. Call your doctor for medical advice about side effects. You may report side effects to FDA at 1-800-FDA-1088. Where should I keep my medicine? Keep out of the reach of children. Store at room temperature between 15 and 30 degrees C (59 and 86 degrees F). Protect from light. Throw away any unused medicine after the expiration date. NOTE: This sheet is a summary. It may not cover all possible information. If you have questions about this medicine, talk to your doctor, pharmacist, or health care provider.  2018 Elsevier/Gold Standard (2010-04-04 14:54:04)  Fluoxetine capsules or tablets (PMDD indication) What is this medicine? FLUOXETINE (floo OX e teen) belongs to a class of drugs known as selective serotonin reuptake inhibitors (SSRIs). It is used for premenstrual dysphoric disorder (PMDD). PMDD causes intense mood and physical symptoms a week or two before your period every month. This drug helps improve mood swings, tiredness, tension, and breast tenderness. This medicine may be used for other purposes; ask your health care  provider or pharmacist if you have questions. COMMON BRAND NAME(S): Prozac, Sarafem, Selfemra What should I tell my health care provider before I take this medicine? They need to know if you have any of these conditions: -bipolar disorder or a family history of bipolar disorder -bleeding disorders -glaucoma -heart disease -liver disease -low levels of sodium in the blood -seizures -suicidal thoughts, plans, or attempt; a previous suicide attempt by you or a family member -take MAOIs like Carbex, Eldepryl, Marplan, Nardil, and Parnate -take medicines that treat or prevent blood clots -thyroid disease -an unusual or allergic reaction to fluoxetine, other medicines, foods, dyes, or preservatives -pregnant or trying to get pregnant -breast-feeding -bipolar disorder or a family history of bipolar disorder -bleeding disorders -glaucoma -heart disease -liver disease -low levels of sodium in the blood -seizures -suicidal thoughts, plans, or attempt; a previous suicide attempt by you or a family member -take MAOIs like Carbex, Eldepryl, Marplan, Nardil, and Parnate -take medicines that treat or prevent blood clots -thyroid disease -an unusual or allergic reaction to fluoxetine, other medicines, foods, dyes, or preservatives -pregnant or trying to get pregnant -breast-feeding How should I use this medicine? Take this medicine by mouth with a glass of water. Follow the directions on the prescription label. You can take it with or without food. Take your medicine at regular intervals. Do not take it more often than directed. Do not stop taking this  medicine suddenly except upon the advice of your doctor. Stopping this medicine too quickly may cause serious side effects or your condition may worsen. A special MedGuide will be given to you by the pharmacist with each prescription and refill. Be sure to read this information carefully each time. Talk to your pediatrician regarding the use of this  medicine in children. Special care may be needed. Overdosage: If you think you have taken too much of this medicine contact a poison control center or emergency room at once. NOTE: This medicine is only for you. Do not share this medicine with others. What if I miss a dose? If you miss a dose, skip the missed dose and go back to your regular dosing schedule. Do not take double or extra doses. What may interact with this medicine? Do not take this medicine with any of the following medications: -other medicines containing fluoxetine, like Prozac or Symbyax -cisapride -linezolid -MAOIs like Carbex, Eldepryl, Marplan, Nardil, and Parnate -methylene blue (injected into a vein) -pimozide -thioridazine This medicine may also interact with the following medications: -alcohol -amphetamines -aspirin and aspirin-like medicines -carbamazepine -certain medicines for depression, anxiety, or psychotic disturbances -certain medicines for migraine headaches like almotriptan, eletriptan, frovatriptan, naratriptan, rizatriptan, sumatriptan, zolmitriptan -digoxin -diuretics -fentanyl -flecainide -furazolidone -isoniazid -lithium -medicines for sleep -medicines that treat or prevent blood clots like warfarin, enoxaparin, and dalteparin -NSAIDs, medicines for pain and inflammation, like ibuprofen or naproxen -phenytoin -procarbazine -propafenone -rasagiline -ritonavir -supplements like St. John's wort, kava kava, valerian -tramadol -tryptophan -vinblastine This list may not describe all possible interactions. Give your health care provider a list of all the medicines, herbs, non-prescription drugs, or dietary supplements you use. Also tell them if you smoke, drink alcohol, or use illegal drugs. Some items may interact with your medicine. What should I watch for while using this medicine? Tell your doctor if your symptoms do not get better or if they get worse. Visit your doctor or health care  professional for regular checks on your progress. Patients and their families should watch out for new or worsening thoughts of suicide or depression. Also watch out for sudden changes in feelings such as feeling anxious, agitated, panicky, irritable, hostile, aggressive, impulsive, severely restless, overly excited and hyperactive, or not being able to sleep. If this happens, especially at the beginning of treatment or after a change in dose, call your health care professional. Dennis Bast may get drowsy or dizzy. Do not drive, use machinery, or do anything that needs mental alertness until you know how this medicine affects you. Do not stand or sit up quickly, especially if you are an older patient. This reduces the risk of dizzy or fainting spells. Alcohol may interfere with the effect of this medicine. Avoid alcoholic drinks. Your mouth may get dry. Chewing sugarless gum or sucking hard candy, and drinking plenty of water may help. Contact your doctor if the problem does not go away or is severe. This medicine may affect blood sugar levels. If you have diabetes, check with your doctor or health care professional before you change your diet or the dose of your diabetic medicine. What side effects may I notice from receiving this medicine? Side effects that you should report to your doctor or health care professional as soon as possible: -allergic reactions like skin rash, itching or hives, swelling of the face, lips, or tongue -anxious -black, tarry stools -breathing problems -changes in vision -confusion -elevated mood, decreased need for sleep, racing thoughts, impulsive behavior -eye pain -fast,  irregular heartbeat -feeling faint or lightheaded, falls -feeling agitated, angry, or irritable -hallucination, loss of contact with reality -loss of balance or coordination -loss of memory -restlessness, pacing, inability to keep still -seizures -stiff muscles -suicidal thoughts or other mood  changes -trouble sleeping -unusual bleeding or bruising -unusually weak or tired -vomiting Side effects that usually do not require medical attention (report to your doctor or health care professional if they continue or are bothersome): -change in appetite or weight -change in sex drive or performance -diarrhea -dry mouth -headache -increased sweating -indigestion, nausea -tremors This list may not describe all possible side effects. Call your doctor for medical advice about side effects. You may report side effects to FDA at 1-800-FDA-1088. Where should I keep my medicine? Keep out of the reach of children. Store at room temperature between 15 and 30 degrees C (59 and 86 degrees F). Throw away any unused medicine after the expiration date. NOTE: This sheet is a summary. It may not cover all possible information. If you have questions about this medicine, talk to your doctor, pharmacist, or health care provider.  2018 Elsevier/Gold Standard (2015-12-16 15:59:42)  Potassium Content of Foods Potassium is a mineral found in many foods and drinks. It helps keep fluids and minerals balanced in your body and affects how steadily your heart beats. Potassium also helps control your blood pressure and keep your muscles and nervous system healthy. Certain health conditions and medicines may change the balance of potassium in your body. When this happens, you can help balance your level of potassium through the foods that you do or do not eat. Your health care provider or dietitian may recommend an amount of potassium that you should have each day. The following lists of foods provide the amount of potassium (in parentheses) per serving in each item. High in potassium The following foods and beverages have 200 mg or more of potassium per serving:  Apricots, 2 raw or 5 dry (200 mg).  Artichoke, 1 medium (345 mg).  Avocado, raw,  each (245 mg).  Banana, 1 medium (425 mg).  Beans, lima, or  baked beans, canned,  cup (280 mg).  Beans, white, canned,  cup (595 mg).  Beef roast, 3 oz (320 mg).  Beef, ground, 3 oz (270 mg).  Beets, raw or cooked,  cup (260 mg).  Bran muffin, 2 oz (300 mg).  Broccoli,  cup (230 mg).  Brussels sprouts,  cup (250 mg).  Cantaloupe,  cup (215 mg).  Cereal, 100% bran,  cup (200-400 mg).  Cheeseburger, single, fast food, 1 each (225-400 mg).  Chicken, 3 oz (220 mg).  Clams, canned, 3 oz (535 mg).  Crab, 3 oz (225 mg).  Dates, 5 each (270 mg).  Dried beans and peas,  cup (300-475 mg).  Figs, dried, 2 each (260 mg).  Fish: halibut, tuna, cod, snapper, 3 oz (480 mg).  Fish: salmon, haddock, swordfish, perch, 3 oz (300 mg).  Fish, tuna, canned 3 oz (200 mg).  [place] fries, fast food, 3 oz (470 mg).  Granola with fruit and nuts,  cup (200 mg).  Grapefruit juice,  cup (200 mg).  Greens, beet,  cup (655 mg).  Honeydew melon,  cup (200 mg).  Kale, raw, 1 cup (300 mg).  Kiwi, 1 medium (240 mg).  Kohlrabi, rutabaga, parsnips,  cup (280 mg).  Lentils,  cup (365 mg).  Mango, 1 each (325 mg).  Milk, chocolate, 1 cup (420 mg).  Milk: nonfat, low-fat, whole, buttermilk, 1 cup (  350-380 mg).  Molasses, 1 Tbsp (295 mg).  Mushrooms,  cup (280) mg.  Nectarine, 1 each (275 mg).  Nuts: almonds, peanuts, hazelnuts, [place], cashew, mixed, 1 oz (200 mg).  Nuts, pistachios, 1 oz (295 mg).  Orange, 1 each (240 mg).  Orange juice,  cup (235 mg).  Papaya, medium,  fruit (390 mg).  Peanut butter, chunky, 2 Tbsp (240 mg).  Peanut butter, smooth, 2 Tbsp (210 mg).  Pear, 1 medium (200 mg).  Pomegranate, 1 whole (400 mg).  Pomegranate juice,  cup (215 mg).  Pork, 3 oz (350 mg).  Potato chips, salted, 1 oz (465 mg).  Potato, baked with skin, 1 medium (925 mg).  Potatoes, boiled,  cup (255 mg).  Potatoes, mashed,  cup (330 mg).  Prune juice,  cup (370 mg).  Prunes, 5 each (305 mg).  Pudding,  chocolate,  cup (230 mg).  Pumpkin, canned,  cup (250 mg).  Raisins, seedless,  cup (270 mg).  Seeds, sunflower or pumpkin, 1 oz (240 mg).  Soy milk, 1 cup (300 mg).  Spinach,  cup (420 mg).  Spinach, canned,  cup (370 mg).  Sweet potato, baked with skin, 1 medium (450 mg).  Swiss chard,  cup (480 mg).  Tomato or vegetable juice,  cup (275 mg).  Tomato sauce or puree,  cup (400-550 mg).  Tomato, raw, 1 medium (290 mg).  Tomatoes, canned,  cup (200-300 mg).  [place], 3 oz (250 mg).  Wheat germ, 1 oz (250 mg).  Winter squash,  cup (250 mg).  Yogurt, plain or fruited, 6 oz (260-435 mg).  Zucchini,  cup (220 mg). Moderate in potassium The following foods and beverages have 50-200 mg of potassium per serving:  Apple, 1 each (150 mg).  Apple juice,  cup (150 mg).  Applesauce,  cup (90 mg).  Apricot nectar,  cup (140 mg).  Asparagus, small spears,  cup or 6 spears (155 mg).  Bagel, cinnamon raisin, 1 each (130 mg).  Bagel, egg or plain, 4 in., 1 each (70 mg).  Beans, green,  cup (90 mg).  Beans, yellow,  cup (190 mg).  Beer, regular, 12 oz (100 mg).  Beets, canned,  cup (125 mg).  Blackberries,  cup (115 mg).  Blueberries,  cup (60 mg).  Bread, whole wheat, 1 slice (70 mg).  Broccoli, raw,  cup (145 mg).  Cabbage,  cup (150 mg).  Carrots, cooked or raw,  cup (180 mg).  Cauliflower, raw,  cup (150 mg).  Celery, raw,  cup (155 mg).  Cereal, bran flakes, cup (120-150 mg).  Cheese, cottage,  cup (110 mg).  Cherries, 10 each (150 mg).  Chocolate, 1 oz bar (165 mg).  Coffee, brewed 6 oz (90 mg).  Corn,  cup or 1 ear (195 mg).  Cucumbers,  cup (80 mg).  Egg, large, 1 each (60 mg).  Eggplant,  cup (60 mg).  Endive, raw, cup (80 mg).  English muffin, 1 each (65 mg).  Fish, orange roughy, 3 oz (150 mg).  Frankfurter, beef or pork, 1 each (75 mg).  Fruit cocktail,  cup (115 mg).  Grape juice,  cup  (170 mg).  Grapefruit,  fruit (175 mg).  Grapes,  cup (155 mg).  Greens: kale, turnip, collard,  cup (110-150 mg).  Ice cream or frozen yogurt, chocolate,  cup (175 mg).  Ice cream or frozen yogurt, vanilla,  cup (120-150 mg).  Lemons, limes, 1 each (80 mg).  Lettuce, all types, 1 cup (100 mg).  Mixed vegetables,  cup (150 mg).  Mushrooms, raw,  cup (110 mg).  Nuts: walnuts, pecans, or macadamia, 1 oz (125 mg).  Oatmeal,  cup (80 mg).  Okra,  cup (110 mg).  Onions, raw,  cup (120 mg).  Peach, 1 each (185 mg).  Peaches, canned,  cup (120 mg).  Pears, canned,  cup (120 mg).  Peas, green, frozen,  cup (90 mg).  Peppers, green,  cup (130 mg).  Peppers, red,  cup (160 mg).  Pineapple juice,  cup (165 mg).  Pineapple, fresh or canned,  cup (100 mg).  Plums, 1 each (105 mg).  Pudding, vanilla,  cup (150 mg).  Raspberries,  cup (90 mg).  Rhubarb,  cup (115 mg).  Rice, wild,  cup (80 mg).  Shrimp, 3 oz (155 mg).  Spinach, raw, 1 cup (170 mg).  Strawberries,  cup (125 mg).  Summer squash  cup (175-200 mg).  Swiss chard, raw, 1 cup (135 mg).  Tangerines, 1 each (140 mg).  Tea, brewed, 6 oz (65 mg).  Turnips,  cup (140 mg).  Watermelon,  cup (85 mg).  Wine, red, table, 5 oz (180 mg).  Wine, white, table, 5 oz (100 mg). Low in potassium The following foods and beverages have less than 50 mg of potassium per serving.  Bread, white, 1 slice (30 mg).  Carbonated beverages, 12 oz (less than 5 mg).  Cheese, 1 oz (20-30 mg).  Cranberries,  cup (45 mg).  Cranberry juice cocktail,  cup (20 mg).  Fats and oils, 1 Tbsp (less than 5 mg).  Hummus, 1 Tbsp (32 mg).  Nectar: papaya, mango, or pear,  cup (35 mg).  Rice, white or brown,  cup (50 mg).  Spaghetti or macaroni,  cup cooked (30 mg).  Tortilla, flour or corn, 1 each (50 mg).  Waffle, 4 in., 1 each (50 mg).  Water chestnuts,  cup (40 mg). This  information is not intended to replace advice given to you by your health care provider. Make sure you discuss any questions you have with your health care provider. Document Released: 02/26/2005 Document Revised: 12/21/2015 Document Reviewed: 06/11/2013 Elsevier Interactive Patient Education  2017 [employer].

## 2016-09-27 NOTE — Progress Notes (Signed)
Patient: ANIIYAH TROUTEN Female    DOB: July 08, 1971   46 y.o.   MRN: [id number] Visit Date: 09/27/2016  Today's Provider: Mar Daring, PA-C   Chief Complaint  Patient presents with  . Follow-up    Obesity   Subjective:    HPI Patient is here for weight Loss Counseling. She is exercising, she was doing boot camp classes 3 times per week. It was a only 6 week class. She is counting calories. She D/C the Phentermine because it was elevating her BP. Pt also reports that husband job lost his job, and she has had extra stress. She reports that even with stopping the phentermine her BP has stayed elevated recently.   Hypertension: Patient here for elevated blood pressure. She is exercising and is adherent to low salt diet.  Blood pressure is not well controlled at home. She reports that she has been checking her blood pressure. The readings at home are in the 140's-150's and upper [id number] for the diastolic. She D/C phentermine because it was causing her BP to be elevated but reports that the readings have been the same.Cardiac symptoms fatigue, lower extremity edema and palpitations. Patient denies chest pain, chest pressure/discomfort, exertional chest pressure/discomfort and syncope.  Cardiovascular risk factors: none. Use of agents associated with hypertension: none.   Depression: Patient complains of depression. She complains of depressed mood, difficulty concentrating, fatigue, feelings of worthlessness/guilt, hopelessness, insomnia and weight gain. Onset was approximately a few years ago, gradually worsening since that time.  She denies current suicidal and homicidal plan or intent.  Risk factors: previous episode of depression Previous treatment includes none . Increased stressors are source. She reports her husband lost his job at the beginning of the month, she has picked up more work to help make ends meet, and she also reports that they had to put their dog down just today for a mass  that ended up being more invasive when they went in to remove it today. She is currently not on any medications. She reports she has used and failed Zoloft before. She has previously taken Celexa and tolerated.   Depression screen PHQ 2/9 09/27/2016  Decreased Interest 3  Down, Depressed, Hopeless 3  PHQ - 2 Score 6  Altered sleeping 3  Tired, decreased energy 3  Change in appetite 2  Feeling bad or failure about yourself  1  Trouble concentrating 3  Moving slowly or fidgety/restless 1  Suicidal thoughts 0  PHQ-9 Score 19  Difficult doing work/chores Extremely dIfficult     Allergies  Allergen Reactions  . Sulfa Antibiotics Rash     Current Outpatient Prescriptions:  .  Cholecalciferol (VITAMIN D) 2000 units CAPS, Take by mouth., Disp: , Rfl:  .  ibuprofen (ADVIL,MOTRIN) 600 MG tablet, Take 1 tablet (600 mg total) by mouth every 6 (six) hours as needed., Disp: 30 tablet, Rfl: 0 .  Magnesium 250 MG TABS, Take 1 tablet (250 mg total) by mouth daily., Disp: 30 tablet, Rfl: 0 .  Vitamin D, Ergocalciferol, (DRISDOL) 50000 units CAPS capsule, TAKE 1 CAPSULE (50,000 UNITS TOTAL) BY MOUTH EVERY 7 (SEVEN) DAYS., Disp: 4 capsule, Rfl: 3 .  phentermine 37.5 MG capsule, Take 1 capsule (37.5 mg total) by mouth every morning. (Patient not taking: Reported on 09/27/2016), Disp: 30 capsule, Rfl: 0  Review of Systems  Constitutional: Positive for fatigue.  Respiratory: Negative for cough, chest tightness and shortness of breath.   Cardiovascular: Negative for chest pain,  palpitations and leg swelling.  Gastrointestinal: Positive for nausea. Negative for abdominal pain and vomiting.  Neurological: Negative for dizziness, light-headedness and headaches.  Psychiatric/Behavioral: Positive for decreased concentration, dysphoric mood and sleep disturbance. Negative for self-injury and suicidal ideas. The patient is nervous/anxious.     Social History  Substance Use Topics  . Smoking status: Never  Smoker  . Smokeless tobacco: Never Used  . Alcohol use No   Objective:   BP (!) 152/90 (BP Location: Right Arm, Patient Position: Sitting, Cuff Size: Normal)   Pulse 97   Temp 98.5 F (36.9 C) (Oral)   Resp 16   Wt 174 lb 9.6 oz (79.2 kg)   BMI 31.93 kg/m    Physical Exam  Constitutional: She appears well-developed and well-nourished. No distress.  Neck: Normal range of motion. Neck supple. No tracheal deviation present. No thyromegaly present.  Cardiovascular: Normal rate, regular rhythm and normal heart sounds.  Exam reveals no gallop and no friction rub.   No murmur heard. Pulmonary/Chest: Effort normal and breath sounds normal. No respiratory distress. She has no wheezes. She has no rales.  Musculoskeletal: She exhibits edema (1+ pitting edema).  Lymphadenopathy:    She has no cervical adenopathy.  Skin: She is not diaphoretic.  Psychiatric: Her speech is normal and behavior is normal. Judgment and thought content normal. Cognition and memory are normal. She exhibits a depressed mood.  Flat affect; did not have any visible emotion even when discussing having to put the dog down  Vitals reviewed.     Assessment & Plan:     1. Essential hypertension Worsening, suspect stress induced. Will add maxzide as below since she is also retaining some fluid with pitting edema noted in bilateral lower extremities. She is to monitor BP at home and call if numbers remain elevated. I will see her back in 4 weeks to recheck.  - triamterene-hydrochlorothiazide (MAXZIDE-25) 37.5-25 MG tablet; Take 1 tablet by mouth daily.  Dispense: 90 tablet; Refill: 3  2. Depression, major, single episode, moderate (HCC) Worsening. Will add prozac as below to hopefully help symptoms and not cause weight gain. I will see her back in 4 weeks to recheck and increase dose if needed.  - FLUoxetine (PROZAC) 10 MG tablet; Take 1 tablet (10 mg total) by mouth daily.  Dispense: 30 tablet; Refill: [place], PA-C  [facility]

## 2016-10-02 ENCOUNTER — Encounter: Payer: Self-pay | Admitting: Physician Assistant

## 2016-10-22 ENCOUNTER — Encounter: Payer: Self-pay | Admitting: Physician Assistant

## 2016-10-25 ENCOUNTER — Ambulatory Visit: Payer: Self-pay | Admitting: Physician Assistant

## 2016-11-25 ENCOUNTER — Encounter: Payer: Self-pay | Admitting: Physician Assistant

## 2016-11-25 DIAGNOSIS — I10 Essential (primary) hypertension: Secondary | ICD-10-CM

## 2016-12-03 MED ORDER — METOPROLOL SUCCINATE ER 25 MG PO TB24: 25.0000 mg | ORAL_TABLET | Freq: Every day | ORAL | 0 refills | Status: DC

## 2016-12-03 NOTE — Addendum Note (Signed)
Addended by: Mar Daring on: 12/03/2016 11:11 AM   Modules accepted: Orders

## 2017-02-03 ENCOUNTER — Other Ambulatory Visit: Payer: Self-pay | Admitting: Physician Assistant

## 2017-02-03 DIAGNOSIS — F321 Major depressive disorder, single episode, moderate: Secondary | ICD-10-CM

## 2017-05-05 ENCOUNTER — Other Ambulatory Visit: Payer: Self-pay | Admitting: Physician Assistant

## 2017-05-05 DIAGNOSIS — I10 Essential (primary) hypertension: Secondary | ICD-10-CM

## 2017-06-12 ENCOUNTER — Ambulatory Visit (INDEPENDENT_AMBULATORY_CARE_PROVIDER_SITE_OTHER): Payer: 59 | Admitting: Physician Assistant

## 2017-06-12 ENCOUNTER — Other Ambulatory Visit: Payer: Self-pay

## 2017-06-12 ENCOUNTER — Encounter: Payer: Self-pay | Admitting: Physician Assistant

## 2017-06-12 VITALS — BP 118/82 | HR 72 | Temp 98.7°F | Resp 14 | Wt 166.2 lb

## 2017-06-12 DIAGNOSIS — Z136 Encounter for screening for cardiovascular disorders: Secondary | ICD-10-CM | POA: Diagnosis not present

## 2017-06-12 DIAGNOSIS — Z124 Encounter for screening for malignant neoplasm of cervix: Secondary | ICD-10-CM

## 2017-06-12 DIAGNOSIS — Z1239 Encounter for other screening for malignant neoplasm of breast: Secondary | ICD-10-CM

## 2017-06-12 DIAGNOSIS — Z0000 Encounter for general adult medical examination without abnormal findings: Secondary | ICD-10-CM | POA: Diagnosis not present

## 2017-06-12 DIAGNOSIS — Z1211 Encounter for screening for malignant neoplasm of colon: Secondary | ICD-10-CM | POA: Diagnosis not present

## 2017-06-12 DIAGNOSIS — Z13220 Encounter for screening for lipoid disorders: Secondary | ICD-10-CM | POA: Diagnosis not present

## 2017-06-12 DIAGNOSIS — E559 Vitamin D deficiency, unspecified: Secondary | ICD-10-CM

## 2017-06-12 DIAGNOSIS — Z1231 Encounter for screening mammogram for malignant neoplasm of breast: Secondary | ICD-10-CM

## 2017-06-12 DIAGNOSIS — Z833 Family history of diabetes mellitus: Secondary | ICD-10-CM | POA: Diagnosis not present

## 2017-06-12 NOTE — Progress Notes (Signed)
Patient: Audrey Koch, Female    DOB: [birth year], 46 y.o.   MRN: [id number] Visit Date: 06/12/2017  Today's Provider: Mar Daring, PA-C   Chief Complaint  Patient presents with  . Annual Exam   Subjective:    Annual physical exam Audrey Koch is a 46 y.o. female who presents today for health maintenance and complete physical. She feels well. She reports exercising - walks daily, weights 2 times a week. She reports she is sleeping well.  Patient's last CPE was done on 02/10/15.  Last pap smear was done on 02/10/15 it was negative and HPV was negative. Patient states she never had abnormal pap smear before, never had hysterectomy. Mammogram was done 07/26/16 negative.  Never had colonoscopy, never had BMD.  Patient is up to date on Flu shot, received this through work. Patient is not sure of when she had her Tetanus immunization.  Review of Systems  Constitutional: Negative.   HENT: Negative.   Eyes: Negative.   Respiratory: Negative.   Cardiovascular: Positive for palpitations (occasionally; improved since previous).  Gastrointestinal: Positive for constipation (controlled with OTC medications currently).  Endocrine: Negative.   Genitourinary: Negative.   Musculoskeletal: Negative.   Skin: Negative.   Allergic/Immunologic: Negative.   Neurological: Positive for headaches (menstrual; statrts a day or two before her menstrual cycle).  Hematological: Negative.   Psychiatric/Behavioral: Negative.     Social History      She  reports that  has never smoked. she has never used smokeless tobacco. She reports that she does not drink alcohol or use drugs.       Social History   Socioeconomic History  . Marital status: Married    Spouse name: Not on file  . Number of children: Not on file  . Years of education: Not on file  . Highest education level: Not on file  Social Needs  . Financial resource strain: Not on file  . Food insecurity - worry: Not on file  .  Food insecurity - inability: Not on file  . Transportation needs - medical: Not on file  . Transportation needs - non-medical: Not on file  Occupational History  . Not on file  Tobacco Use  . Smoking status: Never Smoker  . Smokeless tobacco: Never Used  Substance and Sexual Activity  . Alcohol use: No  . Drug use: No  . Sexual activity: Not on file  Other Topics Concern  . Not on file  Social History Narrative  . Not on file    Past Medical History:  Diagnosis Date  . Hyperlipidemia   . Migraine      Patient Active Problem List   Diagnosis Date Noted  . Atypical chest pain 02/13/2015  . Palpitations 02/13/2015  . Abnormal weight gain 01/11/2015  . Closed fracture of multiple ribs 01/11/2015  . Closed fracture of clavicle 01/11/2015  . Contact with and suspected exposure to infections with predominantly sexual mode of transmission 01/11/2015  . External hemorrhoid 01/11/2015  . Alopecia 01/11/2015  . Polypharmacy 01/11/2015  . H/O renal calculi 01/11/2015  . Fungal infection of nail 01/11/2015  . Night sweat 01/11/2015  . Avitaminosis D 01/11/2015  . Anxiety 12/10/2005  . Acute onset aura migraine 12/10/2005    Past Surgical History:  Procedure Laterality Date  . HEMORROIDECTOMY  10/08/13  . NO PAST SURGERIES      Family History        Family Status  Relation Name  Status  . Mother  Alive  . Father  Deceased  . Brother 1 Alive  . MGM  Alive  . MGF  Alive  . PGM  Alive  . PGF  Alive  . Brother 2 Alive  . Neg Hx  (Not Specified)        Her family history includes Dementia in her maternal grandmother and paternal grandmother; Diabetes in her brother, maternal grandfather, and paternal grandfather; Esophageal cancer in her paternal grandfather; Heart attack in her mother; Hyperlipidemia in her mother.     Allergies  Allergen Reactions  . Sulfa Antibiotics Rash     Current Outpatient Medications:  .  Cholecalciferol (VITAMIN D) 2000 units CAPS, Take  by mouth., Disp: , Rfl:  .  FLUoxetine (PROZAC) 10 MG capsule, TAKE 1 CAPSULE (10 MG) BY MOUTH DAILY., Disp: 30 capsule, Rfl: 5 .  ibuprofen (ADVIL,MOTRIN) 600 MG tablet, Take 1 tablet (600 mg total) by mouth every 6 (six) hours as needed., Disp: 30 tablet, Rfl: 0 .  Magnesium 250 MG TABS, Take 1 tablet (250 mg total) by mouth daily., Disp: 30 tablet, Rfl: 0 .  metoprolol succinate (TOPROL-XL) 25 MG 24 hr tablet, TAKE 1 TABLET BY MOUTH DAILY, Disp: 90 tablet, Rfl: 1 .  triamterene-hydrochlorothiazide (MAXZIDE-25) 37.5-25 MG tablet, Take 1 tablet by mouth daily., Disp: 90 tablet, Rfl: 3   Patient Care Team: Mar Daring, PA-C as PCP - General (Family Medicine) Margarita Rana, MD as Referring Physician (Family Medicine) Christene Lye, MD (General Surgery)      Objective:   Vitals: BP 118/82   Pulse 72   Temp 98.7 F (37.1 C)   Resp 14   Wt 166 lb 3.2 oz (75.4 kg)   BMI 30.40 kg/m    Vitals:   06/12/17 1504  BP: 118/82  Pulse: 72  Resp: 14  Temp: 98.7 F (37.1 C)  Weight: 166 lb 3.2 oz (75.4 kg)     Physical Exam  Constitutional: She is oriented to person, place, and time. She appears well-developed and well-nourished. No distress.  HENT:  Head: Normocephalic and atraumatic.  Right Ear: Hearing, tympanic membrane, external ear and ear canal normal.  Left Ear: Hearing, tympanic membrane, external ear and ear canal normal.  Nose: Nose normal.  Mouth/Throat: Uvula is midline, oropharynx is clear and moist and mucous membranes are normal. No oropharyngeal exudate.  Eyes: Conjunctivae and EOM are normal. Pupils are equal, round, and reactive to light. Right eye exhibits no discharge. Left eye exhibits no discharge. No scleral icterus.  Neck: Normal range of motion. Neck supple. No JVD present. Carotid bruit is not present. No tracheal deviation present. No thyromegaly present.  Cardiovascular: Normal rate, regular rhythm, normal heart sounds and intact distal  pulses. Exam reveals no gallop and no friction rub.  No murmur heard. Pulmonary/Chest: Effort normal and breath sounds normal. No respiratory distress. She has no wheezes. She has no rales. She exhibits no tenderness. Right breast exhibits no inverted nipple, no mass, no nipple discharge, no skin change and no tenderness. Left breast exhibits no inverted nipple, no mass, no nipple discharge, no skin change and no tenderness. Breasts are symmetrical.  Abdominal: Soft. Bowel sounds are normal. She exhibits no distension and no mass. There is no tenderness. There is no rebound and no guarding. Hernia confirmed negative in the right inguinal area and confirmed negative in the left inguinal area.  Genitourinary: Rectum normal, vagina normal and uterus normal. No breast swelling, tenderness, discharge  or bleeding. Pelvic exam was performed with patient supine. There is no rash, tenderness, lesion or injury on the right labia. There is no rash, tenderness, lesion or injury on the left labia. Cervix exhibits no motion tenderness, no discharge and no friability. Right adnexum displays no mass, no tenderness and no fullness. Left adnexum displays no mass, no tenderness and no fullness. No erythema, tenderness or bleeding in the vagina. No signs of injury around the vagina. No vaginal discharge found.  Musculoskeletal: Normal range of motion. She exhibits no edema or tenderness.  Lymphadenopathy:    She has no cervical adenopathy.       Right: No inguinal adenopathy present.       Left: No inguinal adenopathy present.  Neurological: She is alert and oriented to person, place, and time. She has normal reflexes. No cranial nerve deficit. Coordination normal.  Skin: Skin is warm and dry. No rash noted. She is not diaphoretic.  Psychiatric: She has a normal mood and affect. Her behavior is normal. Judgment and thought content normal.  Vitals reviewed.    Depression Screen PHQ 2/9 Scores 06/12/2017 09/27/2016  02/10/2015  PHQ - 2 Score 0 6 0  PHQ- 9 Score 0 19 -      Assessment & Plan:    1. Annual physical exam Normal physical exam today. Will check labs as below and f/u pending lab results. If labs are stable and WNL she will not need to have these rechecked for one year at her next annual physical exam. She is to call the office in the meantime if she has any acute issue, questions or concerns. - CBC w/Diff/Platelet - COMPLETE METABOLIC PANEL WITH GFR - TSH - Lipid Profile - HgB A1c  2. Breast cancer screening Breast exam today was normal. There is no family history of breast cancer. She does perform regular self breast exams. Mammogram was ordered as below. Information for [facility] was given to patient so she may schedule her mammogram at her convenience. - MM Digital Screening; Future  3. Cervical cancer screening Pap collected today. Will send as below and f/u pending results. - Pap IG and HPV (high risk) DNA detection  4. Colon cancer screening Hemocult in the office was negative. - POCT Occult Blood Stool  5. Avitaminosis D Patient taking 10,000IU daily. Will check labs as below and f/u pending results. - Vitamin D (25 hydroxy)  6. Encounter for lipid screening for cardiovascular disease Will check labs as below and f/u pending results. - Lipid Profile  7. Family history of diabetes mellitus (DM) Will check labs as below and f/u pending results. - HgB A1c    Mar Daring, PA-C  [facility]

## 2017-06-12 NOTE — Patient Instructions (Signed)

## 2017-06-13 ENCOUNTER — Other Ambulatory Visit
Admission: RE | Admit: 2017-06-13 | Discharge: 2017-06-13 | Disposition: A | Discharge status: Home / Self Care | Payer: 59 | Source: Ambulatory Visit | Attending: Physician Assistant | Admitting: Physician Assistant

## 2017-06-13 DIAGNOSIS — Z13220 Encounter for screening for lipoid disorders: Secondary | ICD-10-CM | POA: Insufficient documentation

## 2017-06-13 DIAGNOSIS — E559 Vitamin D deficiency, unspecified: Secondary | ICD-10-CM | POA: Diagnosis not present

## 2017-06-13 DIAGNOSIS — Z833 Family history of diabetes mellitus: Secondary | ICD-10-CM | POA: Diagnosis not present

## 2017-06-13 DIAGNOSIS — Z0000 Encounter for general adult medical examination without abnormal findings: Secondary | ICD-10-CM | POA: Diagnosis not present

## 2017-06-13 DIAGNOSIS — Z136 Encounter for screening for cardiovascular disorders: Secondary | ICD-10-CM | POA: Diagnosis not present

## 2017-06-13 LAB — CBC WITH DIFFERENTIAL/PLATELET
BASOS PCT: 1 %
Basophils Absolute: 0 10*3/uL (ref 0–0.1)
EOS ABS: 0.1 10*3/uL (ref 0–0.7)
EOS PCT: 1 %
HCT: 40.3 % (ref 35.0–47.0)
HEMOGLOBIN: 13.4 g/dL (ref 12.0–16.0)
Lymphocytes Relative: 25 %
Lymphs Abs: 1.6 10*3/uL (ref 1.0–3.6)
MCH: 28.8 pg (ref 26.0–34.0)
MCHC: 33.4 g/dL (ref 32.0–36.0)
MCV: 86.2 fL (ref 80.0–100.0)
MONO ABS: 0.6 10*3/uL (ref 0.2–0.9)
MONOS PCT: 9 %
NEUTROS PCT: 64 %
Neutro Abs: 4.1 10*3/uL (ref 1.4–6.5)
PLATELETS: 272 10*3/uL (ref 150–440)
RBC: 4.68 MIL/uL (ref 3.80–5.20)
RDW: 13.5 % (ref 11.5–14.5)
WBC: 6.4 10*3/uL (ref 3.6–11.0)

## 2017-06-13 LAB — COMPREHENSIVE METABOLIC PANEL
ALBUMIN: 4 g/dL (ref 3.5–5.0)
ALT: 16 U/L (ref 14–54)
AST: 19 U/L (ref 15–41)
Alkaline Phosphatase: 70 U/L (ref 38–126)
Anion gap: 10 (ref 5–15)
BUN: 21 mg/dL — ABNORMAL HIGH (ref 6–20)
CHLORIDE: 102 mmol/L (ref 101–111)
CO2: 24 mmol/L (ref 22–32)
CREATININE: 0.98 mg/dL (ref 0.44–1.00)
Calcium: 8.9 mg/dL (ref 8.9–10.3)
GFR calc non Af Amer: >60 mL/min (ref >60)
Glucose, Bld: 102 mg/dL — ABNORMAL HIGH (ref 65–99)
Potassium: 3.2 mmol/L — ABNORMAL LOW (ref 3.5–5.1)
SODIUM: 136 mmol/L (ref 135–145)
Total Bilirubin: 0.7 mg/dL (ref 0.3–1.2)
Total Protein: 7.3 g/dL (ref 6.5–8.1)

## 2017-06-13 LAB — TSH: TSH: 3.107 u[IU]/mL (ref 0.350–4.500)

## 2017-06-13 LAB — LIPID PANEL
CHOL/HDL RATIO: 4.4 ratio
CHOLESTEROL: 217 mg/dL — AB (ref 0–200)
HDL: 49 mg/dL (ref >40)
LDL Cholesterol: 157 mg/dL — ABNORMAL HIGH (ref 0–99)
TRIGLYCERIDES: 56 mg/dL (ref <150)
VLDL: 11 mg/dL (ref 0–40)

## 2017-06-13 LAB — HEMOCCULT GUIAC POC 1CARD (OFFICE): FECAL OCCULT BLD: NEGATIVE

## 2017-06-13 LAB — HEMOGLOBIN A1C
Hgb A1c MFr Bld: 5.4 % (ref 4.8–5.6)
MEAN PLASMA GLUCOSE: 108.28 mg/dL

## 2017-06-14 LAB — VITAMIN D 25 HYDROXY (VIT D DEFICIENCY, FRACTURES): VIT D 25 HYDROXY: 75.2 ng/mL (ref 30.0–100.0)

## 2017-06-17 ENCOUNTER — Encounter: Payer: Self-pay | Admitting: Physician Assistant

## 2017-06-17 ENCOUNTER — Telehealth: Payer: Self-pay

## 2017-06-17 NOTE — Telephone Encounter (Signed)
-----   Message from Mar Daring, PA-C sent at 06/17/2017  8:30 AM EST ----- Vit D normal at 75.2. A1c normal at 5.4

## 2017-06-17 NOTE — Telephone Encounter (Signed)
NA. LMTCB with any questions. Pt advised to look at my chart for results. sd

## 2017-06-18 ENCOUNTER — Telehealth: Payer: Self-pay | Admitting: Physician Assistant

## 2017-06-18 LAB — PAP IG AND HPV HIGH-RISK: HPV DNA HIGH RISK: NOT DETECTED

## 2017-06-18 NOTE — Telephone Encounter (Signed)
LM that we were calling regarding pap results and that she can view the results through my chart.  Thanks,  -Joseline

## 2017-06-18 NOTE — Telephone Encounter (Signed)
Pt is returning call.  CB#[phone]/MW

## 2017-06-18 NOTE — Telephone Encounter (Signed)
-----   Message from [place] sent at 06/18/2017 10:36 AM EST ----- [facility]  ED

## 2017-11-05 ENCOUNTER — Other Ambulatory Visit: Payer: Self-pay | Admitting: Physician Assistant

## 2017-11-05 DIAGNOSIS — F321 Major depressive disorder, single episode, moderate: Secondary | ICD-10-CM

## 2017-11-13 ENCOUNTER — Encounter: Payer: Self-pay | Admitting: Physician Assistant

## 2017-11-23 IMAGING — MG MM DIGITAL SCREENING BILAT W/ TOMO W/ CAD
8 of 13 series · 8 of 29 positions shown · non-contrast
Comparison: Previous exam(s).

CLINICAL DATA: Screening.

EXAM:
2D DIGITAL SCREENING BILATERAL MAMMOGRAM WITH CAD AND ADJUNCT TOMO

[R XCCL]
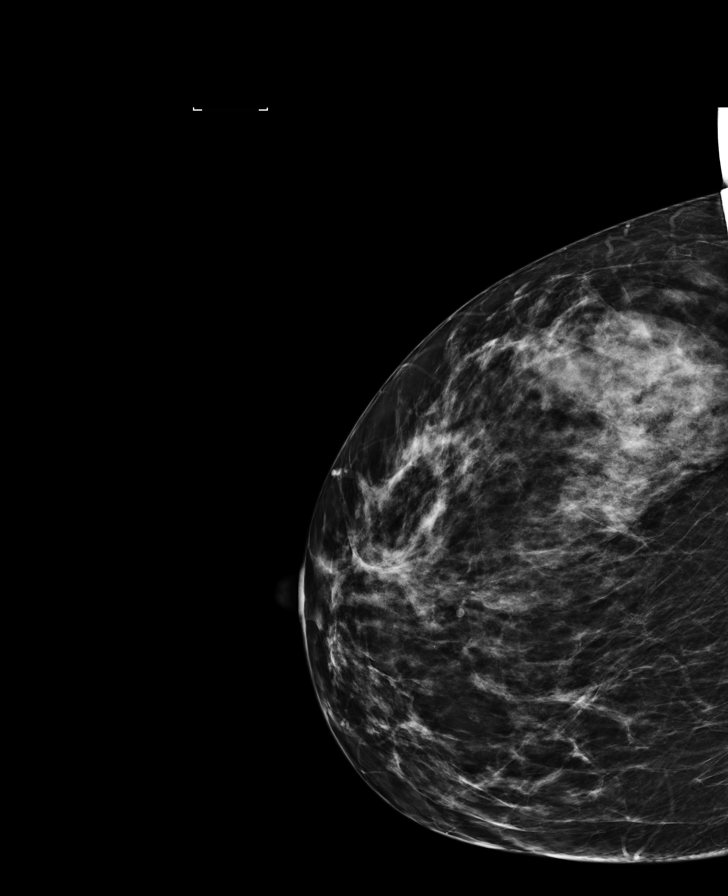

[L CC]
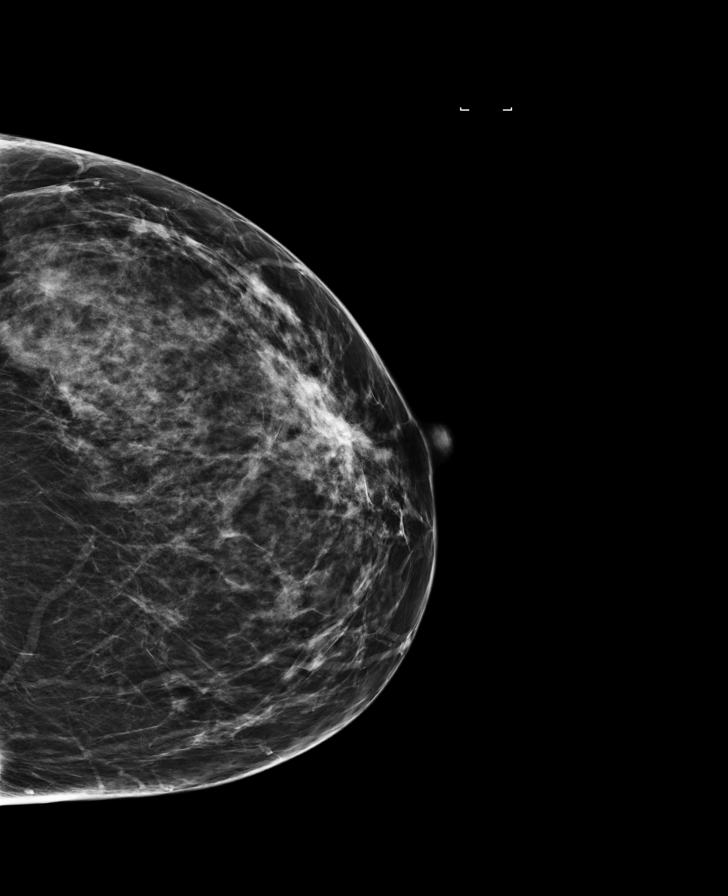

[L CC synth-2D]
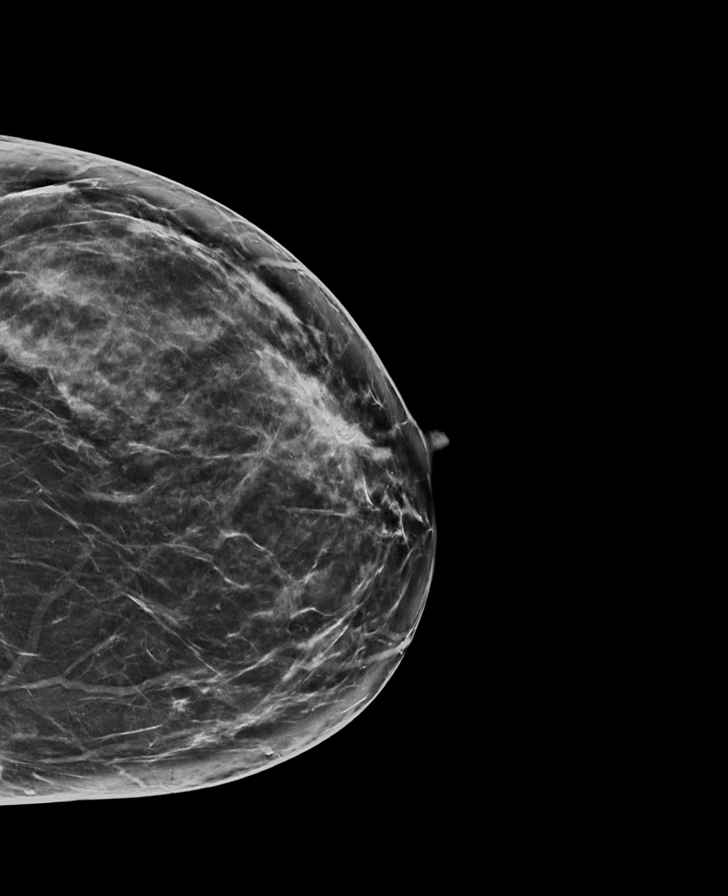

[R CC]
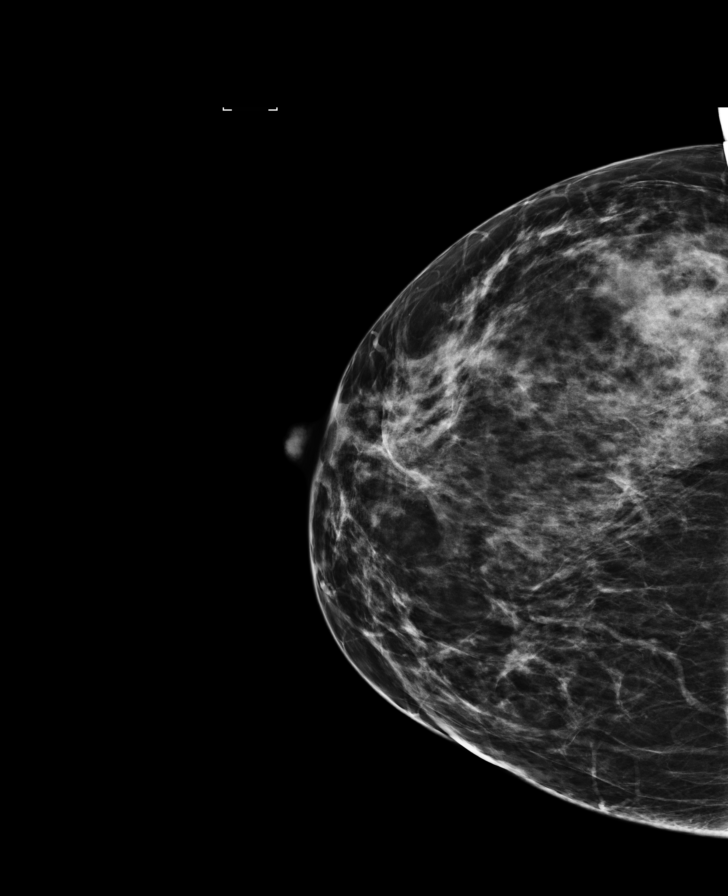

[R MLO synth-2D]
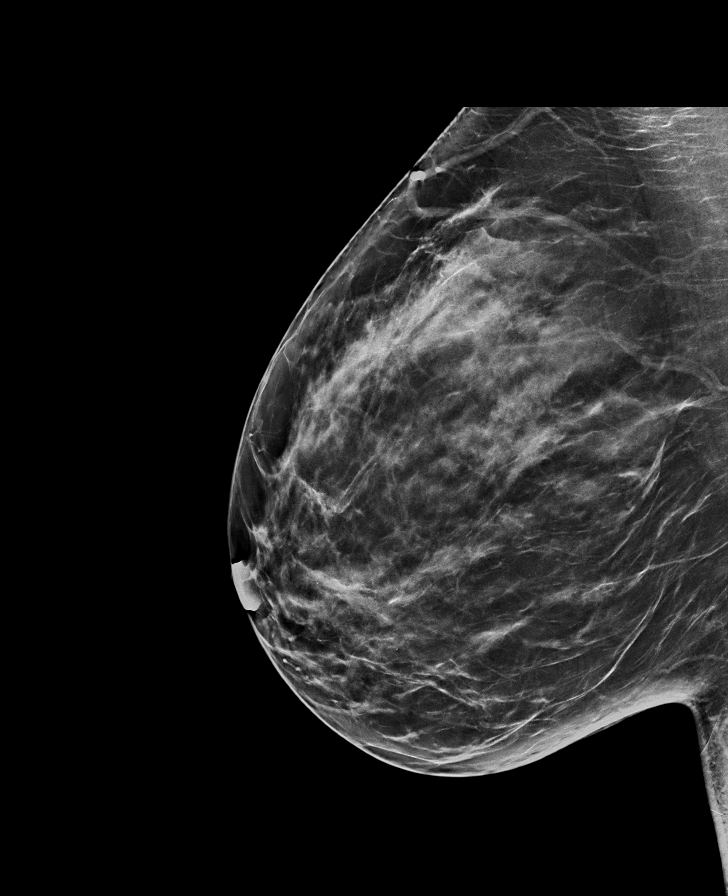

[R MLO]
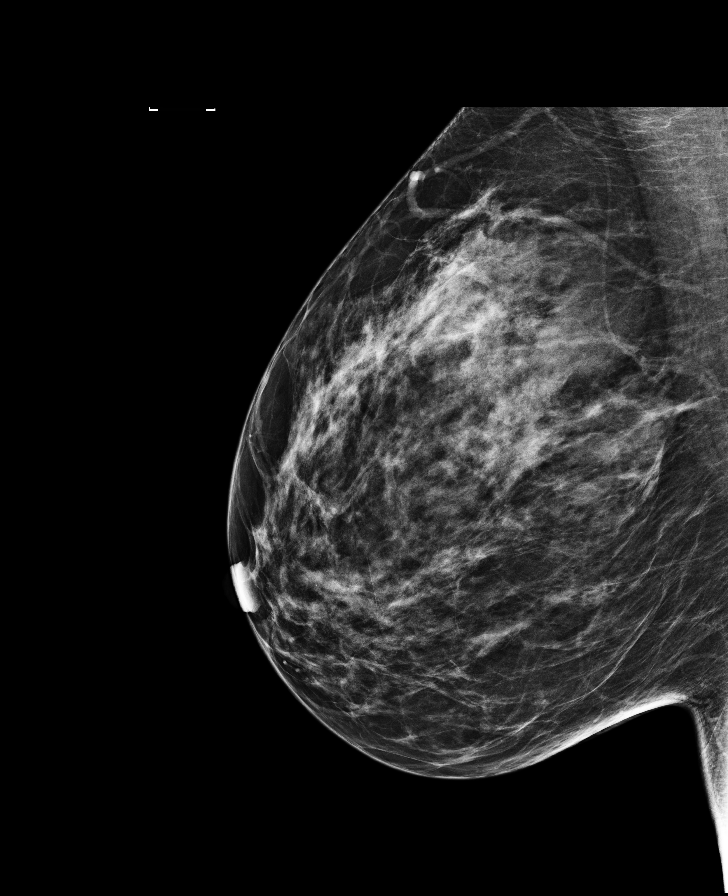

[R CC synth-2D]
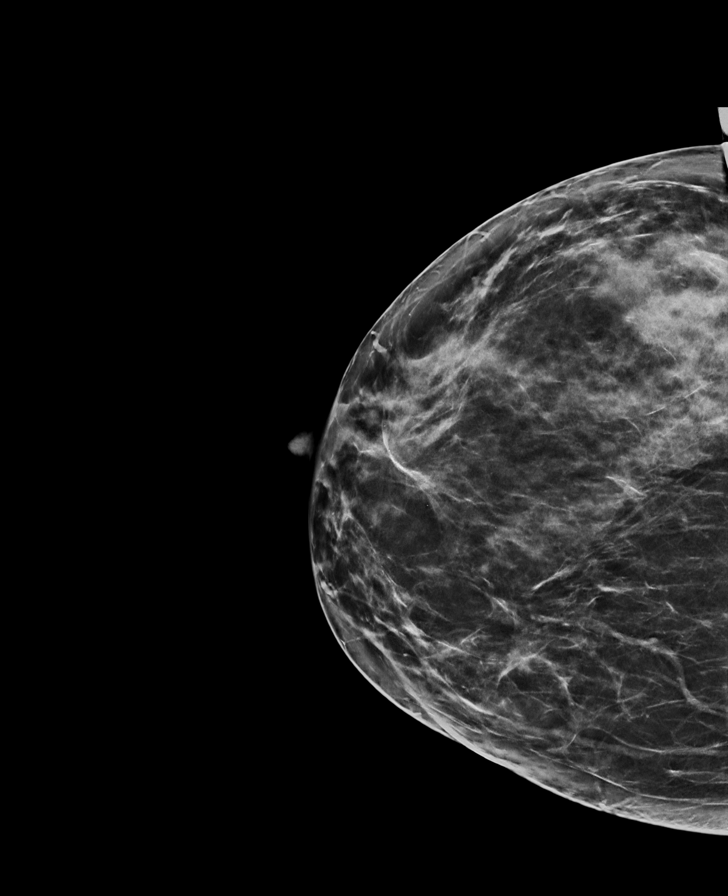

[L MLO synth-2D]
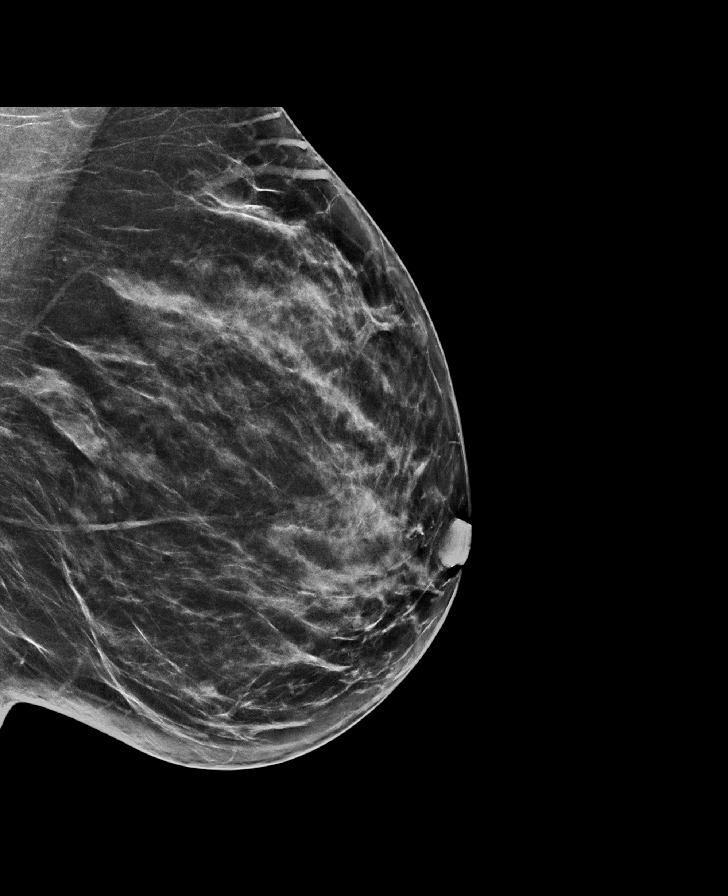

[8 of 29 positions shown; findings below may reference images not displayed]

ACR Breast Density Category c: The breast tissue is heterogeneously
dense, which may obscure small masses.
FINDINGS: There are no findings suspicious for malignancy. Images were
processed with CAD.
IMPRESSION: No mammographic evidence of malignancy. A result letter of this
screening mammogram will be mailed directly to the patient.

RECOMMENDATION:
Screening mammogram in one year. (Code:TN-[id number])

BI-RADS CATEGORY  1: Negative.

## 2018-01-21 ENCOUNTER — Other Ambulatory Visit: Payer: Self-pay | Admitting: Physician Assistant

## 2018-01-21 DIAGNOSIS — I10 Essential (primary) hypertension: Secondary | ICD-10-CM

## 2018-05-04 ENCOUNTER — Other Ambulatory Visit: Payer: Self-pay | Admitting: Physician Assistant

## 2018-05-04 DIAGNOSIS — I10 Essential (primary) hypertension: Secondary | ICD-10-CM

## 2018-06-15 ENCOUNTER — Encounter: Payer: Self-pay | Admitting: Physician Assistant

## 2018-06-15 ENCOUNTER — Ambulatory Visit (INDEPENDENT_AMBULATORY_CARE_PROVIDER_SITE_OTHER): Payer: 59 | Admitting: Physician Assistant

## 2018-06-15 VITALS — BP 140/90 | HR 90 | Temp 98.4°F | Resp 16 | Ht 62.0 in | Wt 188.0 lb

## 2018-06-15 DIAGNOSIS — F325 Major depressive disorder, single episode, in full remission: Secondary | ICD-10-CM

## 2018-06-15 DIAGNOSIS — L689 Hypertrichosis, unspecified: Secondary | ICD-10-CM | POA: Diagnosis not present

## 2018-06-15 DIAGNOSIS — Z13220 Encounter for screening for lipoid disorders: Secondary | ICD-10-CM

## 2018-06-15 DIAGNOSIS — Z6834 Body mass index (BMI) 34.0-34.9, adult: Secondary | ICD-10-CM

## 2018-06-15 DIAGNOSIS — Z136 Encounter for screening for cardiovascular disorders: Secondary | ICD-10-CM | POA: Diagnosis not present

## 2018-06-15 DIAGNOSIS — Z0000 Encounter for general adult medical examination without abnormal findings: Secondary | ICD-10-CM | POA: Diagnosis not present

## 2018-06-15 DIAGNOSIS — R5383 Other fatigue: Secondary | ICD-10-CM

## 2018-06-15 DIAGNOSIS — R635 Abnormal weight gain: Secondary | ICD-10-CM

## 2018-06-15 DIAGNOSIS — R0683 Snoring: Secondary | ICD-10-CM | POA: Diagnosis not present

## 2018-06-15 DIAGNOSIS — K5904 Chronic idiopathic constipation: Secondary | ICD-10-CM

## 2018-06-15 DIAGNOSIS — Z1239 Encounter for other screening for malignant neoplasm of breast: Secondary | ICD-10-CM

## 2018-06-15 DIAGNOSIS — Z833 Family history of diabetes mellitus: Secondary | ICD-10-CM

## 2018-06-15 DIAGNOSIS — E559 Vitamin D deficiency, unspecified: Secondary | ICD-10-CM | POA: Diagnosis not present

## 2018-06-15 DIAGNOSIS — F321 Major depressive disorder, single episode, moderate: Secondary | ICD-10-CM | POA: Insufficient documentation

## 2018-06-15 NOTE — Patient Instructions (Signed)

## 2018-06-15 NOTE — Progress Notes (Signed)
Patient: Audrey Koch, Female    DOB: August 31, 1970, 47 y.o.   MRN: [id number] Visit Date: 06/15/2018  Today's Provider: Mar Daring, PA-C   Chief Complaint  Patient presents with  . Annual Exam   Subjective:    Annual physical exam Audrey Koch is a 47 y.o. female who presents today for health maintenance and complete physical. She feels fairly well. She reports exercising. She reports she is sleeping fairly well, she reports that she is snoring at night. Reports that she is fatigued during the day. She has had significant weight gain over the last year. With the extra weight and fatigue she has decreased her physical activity and is having more joint aches and pains.  -----------------------------------------------------------------   Review of Systems  Constitutional: Positive for diaphoresis and fatigue.  HENT: Negative.   Eyes: Negative.   Respiratory: Negative.        Snoring at night  Cardiovascular: Negative.   Gastrointestinal: Positive for constipation.  Endocrine: Negative.   Genitourinary: Negative.   Musculoskeletal: Positive for arthralgias and back pain.  Skin: Negative.   Allergic/Immunologic: Negative.   Neurological: Positive for headaches.  Hematological: Negative.   Psychiatric/Behavioral: Negative.     Social History      She  reports that she has never smoked. She has never used smokeless tobacco. She reports that she does not drink alcohol or use drugs.       Social History   Socioeconomic History  . Marital status: Married    Spouse name: Not on file  . Number of children: Not on file  . Years of education: Not on file  . Highest education level: Not on file  Occupational History  . Not on file  Social Needs  . Financial resource strain: Not on file  . Food insecurity:    Worry: Not on file    Inability: Not on file  . Transportation needs:    Medical: Not on file    Non-medical: Not on file  Tobacco Use  . Smoking status:  Never Smoker  . Smokeless tobacco: Never Used  Substance and Sexual Activity  . Alcohol use: No  . Drug use: No  . Sexual activity: Not on file  Lifestyle  . Physical activity:    Days per week: Not on file    Minutes per session: Not on file  . Stress: Not on file  Relationships  . Social connections:    Talks on phone: Not on file    Gets together: Not on file    Attends religious service: Not on file    Active member of club or organization: Not on file    Attends meetings of clubs or organizations: Not on file    Relationship status: Not on file  Other Topics Concern  . Not on file  Social History Narrative  . Not on file    Past Medical History:  Diagnosis Date  . Hyperlipidemia   . Migraine      Patient Active Problem List   Diagnosis Date Noted  . Atypical chest pain 02/13/2015  . Palpitations 02/13/2015  . Abnormal weight gain 01/11/2015  . Closed fracture of multiple ribs 01/11/2015  . Closed fracture of clavicle 01/11/2015  . Contact with and suspected exposure to infections with predominantly sexual mode of transmission 01/11/2015  . External hemorrhoid 01/11/2015  . Alopecia 01/11/2015  . Polypharmacy 01/11/2015  . H/O renal calculi 01/11/2015  . Fungal infection of  nail 01/11/2015  . Night sweat 01/11/2015  . Avitaminosis D 01/11/2015  . Anxiety 12/10/2005  . Acute onset aura migraine 12/10/2005    Past Surgical History:  Procedure Laterality Date  . HEMORROIDECTOMY  10/08/13  . NO PAST SURGERIES      Family History        Family Status  Relation Name Status  . Mother  Alive  . Father  Deceased  . Brother 1 Alive  . MGM  Alive  . MGF  Alive  . PGM  Alive  . PGF  Alive  . Brother 2 Alive  . Neg Hx  (Not Specified)        Her family history includes Dementia in her maternal grandmother and paternal grandmother; Diabetes in her brother, maternal grandfather, and paternal grandfather; Esophageal cancer in her paternal grandfather; Heart  attack in her mother; Hyperlipidemia in her mother. There is no history of Breast cancer.      Allergies  Allergen Reactions  . Sulfa Antibiotics Rash     Current Outpatient Medications:  .  ibuprofen (ADVIL,MOTRIN) 600 MG tablet, Take 1 tablet (600 mg total) by mouth every 6 (six) hours as needed., Disp: 30 tablet, Rfl: 0 .  metoprolol succinate (TOPROL-XL) 25 MG 24 hr tablet, TAKE 1 TABLET BY MOUTH DAILY, Disp: 90 tablet, Rfl: 1 .  Multiple Vitamins-Minerals (VITAMIN D3 COMPLETE PO), Take by mouth., Disp: , Rfl:  .  triamterene-hydrochlorothiazide (JJHERDE-08) 37.5-25 MG tablet, TAKE 1 TABLET BY MOUTH DAILY., Disp: 90 tablet, Rfl: 3 .  FLUoxetine (PROZAC) 10 MG capsule, TAKE 1 CAPSULE (10 MG) BY MOUTH DAILY. (Patient not taking: Reported on 06/15/2018), Disp: 30 capsule, Rfl: 5 .  Magnesium 250 MG TABS, Take 1 tablet (250 mg total) by mouth daily. (Patient not taking: Reported on 06/15/2018), Disp: 30 tablet, Rfl: 0   Patient Care Team: Mar Daring, PA-C as PCP - General (Family Medicine) Margarita Rana, MD as Referring Physician (Family Medicine) Christene Lye, MD (General Surgery)      Objective:   Vitals: BP 140/90 (BP Location: Left Arm, Patient Position: Sitting, Cuff Size: Normal)   Pulse 90   Temp 98.4 F (36.9 C) (Oral)   Resp 16   Ht 5\' 2"  (1.575 m)   Wt 188 lb (85.3 kg)   LMP 06/07/2018   SpO2 97%   BMI 34.39 kg/m    Vitals:   06/15/18 1409  BP: 140/90  Pulse: 90  Resp: 16  Temp: 98.4 F (36.9 C)  TempSrc: Oral  SpO2: 97%  Weight: 188 lb (85.3 kg)  Height: 5\' 2"  (1.575 m)     Physical Exam  Constitutional: She is oriented to person, place, and time. She appears well-developed and well-nourished. No distress.  HENT:  Head: Normocephalic and atraumatic.  Right Ear: Hearing, tympanic membrane, external ear and ear canal normal.  Left Ear: Hearing, tympanic membrane, external ear and ear canal normal.  Nose: Nose normal.    Mouth/Throat: Uvula is midline, oropharynx is clear and moist and mucous membranes are normal. No oropharyngeal exudate.  Eyes: Pupils are equal, round, and reactive to light. Conjunctivae and EOM are normal. Right eye exhibits no discharge. Left eye exhibits no discharge. No scleral icterus.  Neck: Normal range of motion. Neck supple. No JVD present. Carotid bruit is not present. No tracheal deviation present. No thyromegaly present.  Cardiovascular: Normal rate, regular rhythm, normal heart sounds and intact distal pulses. Exam reveals no gallop and no friction rub.  No murmur heard. Pulmonary/Chest: Effort normal and breath sounds normal. No respiratory distress. She has no wheezes. She has no rales. She exhibits no tenderness.  Abdominal: Soft. Bowel sounds are normal. She exhibits no distension and no mass. There is no tenderness. There is no rebound and no guarding.  Musculoskeletal: Normal range of motion. She exhibits no edema or tenderness.  Lymphadenopathy:    She has no cervical adenopathy.  Neurological: She is alert and oriented to person, place, and time.  Skin: Skin is warm and dry. No rash noted. She is not diaphoretic.  Psychiatric: She has a normal mood and affect. Her behavior is normal. Judgment and thought content normal.  Vitals reviewed.    Depression Screen PHQ 2/9 Scores 06/15/2018 06/15/2018 06/12/2017 09/27/2016  PHQ - 2 Score 0 0 0 6  PHQ- 9 Score 1 - 0 19      Assessment & Plan:     Routine Health Maintenance and Physical Exam  Exercise Activities and Dietary recommendations Goals   None     Immunization History  Administered Date(s) Administered  . DTaP 02/12/1974, 03/11/1988, 10/31/1998  . Hepatitis B 05/16/1992, 06/30/1992, 11/17/1992  . Influenza Whole 05/06/2017    Health Maintenance  Topic Date Due  . HIV Screening  09/02/1984  . TETANUS/TDAP  09/02/1988  . INFLUENZA VACCINE  02/26/2018  . PAP SMEAR  06/12/2020     Discussed  health benefits of physical activity, and encouraged her to engage in regular exercise appropriate for her age and condition.    1. Annual physical exam Normal physical exam today. Will check labs as below and f/u pending lab results. If labs are stable and WNL she will not need to have these rechecked for one year at her next annual physical exam. She is to call the office in the meantime if she has any acute issue, questions or concerns. - CBC with Differential/Platelet - Comprehensive metabolic panel  2. Breast cancer screening There is no family history of breast cancer. She does perform regular self breast exams. Mammogram was ordered as below. Information for [facility] Breast clinic was given to patient so she may schedule her mammogram at her convenience. - MM 3D SCREEN BREAST BILATERAL; Future  3. Encounter for lipid screening for cardiovascular disease Will check labs as below and f/u pending results. - Lipid panel  4. Family history of diabetes mellitus (DM) Will check labs as below and f/u pending results. - Hemoglobin A1c  5. Fatigue, unspecified type Will check labs as below for patient to see if she may have a "hormonal" imbalance that is causing the weight gain. Due to her age as well as the weight gain it is reasonable to check these labs. If normal will consider sleep study next. She is in agreement. Will check labs as below and f/u pending results. - Hormone Panel  6. Snoring New onset over last 6 months with weight gain. Will check labs as noted above. If labs normal will order sleep study.  7. Chronic idiopathic constipation Chronic. No changes. Patient reports only having BM once weekly. Samples of Linzess 221mcg were given to patient. She is to call if this is successful and will send in Rx. Can dose adjust pending response.  - Hormone Panel  8. Excessive hair growth Noted on face per patient. She reports having to pluck daily. Will check labs as below and f/u  pending results. - Hormone Panel  9. Weight gain Has gained 22 pounds in last year. Will  check labs as below and f/u pending results. - Hormone Panel  10. BMI 34.0-34.9,adult Counseled patient on healthy lifestyle modifications including dieting and exercise.   11. Avitaminosis D H/O this. Will check labs as below and f/u pending results. - Vitamin D (25 hydroxy)  12. Major depressive disorder with single episode, in full remission [facility]) Doing well. Off Prozac now.   --------------------------------------------------------------------    Mar Daring, PA-C  [facility]

## 2018-06-16 ENCOUNTER — Other Ambulatory Visit
Admission: RE | Admit: 2018-06-16 | Discharge: 2018-06-16 | Disposition: A | Discharge status: Home / Self Care | Payer: 59 | Source: Ambulatory Visit | Attending: Physician Assistant | Admitting: Physician Assistant

## 2018-06-16 DIAGNOSIS — Z13220 Encounter for screening for lipoid disorders: Secondary | ICD-10-CM | POA: Insufficient documentation

## 2018-06-16 DIAGNOSIS — R5383 Other fatigue: Secondary | ICD-10-CM | POA: Insufficient documentation

## 2018-06-16 DIAGNOSIS — Z833 Family history of diabetes mellitus: Secondary | ICD-10-CM | POA: Insufficient documentation

## 2018-06-16 DIAGNOSIS — Z0000 Encounter for general adult medical examination without abnormal findings: Secondary | ICD-10-CM | POA: Insufficient documentation

## 2018-06-16 DIAGNOSIS — Z136 Encounter for screening for cardiovascular disorders: Secondary | ICD-10-CM | POA: Diagnosis not present

## 2018-06-16 DIAGNOSIS — L689 Hypertrichosis, unspecified: Secondary | ICD-10-CM | POA: Insufficient documentation

## 2018-06-16 DIAGNOSIS — K5904 Chronic idiopathic constipation: Secondary | ICD-10-CM | POA: Diagnosis not present

## 2018-06-16 DIAGNOSIS — R635 Abnormal weight gain: Secondary | ICD-10-CM | POA: Diagnosis not present

## 2018-06-16 DIAGNOSIS — E559 Vitamin D deficiency, unspecified: Secondary | ICD-10-CM | POA: Insufficient documentation

## 2018-06-16 LAB — CBC WITH DIFFERENTIAL/PLATELET
Abs Immature Granulocytes: 0.03 10*3/uL (ref 0.00–0.07)
BASOS ABS: 0 10*3/uL (ref 0.0–0.1)
Basophils Relative: 0 %
EOS ABS: 0.1 10*3/uL (ref 0.0–0.5)
Eosinophils Relative: 2 %
HEMATOCRIT: 41.2 % (ref 36.0–46.0)
Hemoglobin: 13.4 g/dL (ref 12.0–15.0)
Immature Granulocytes: 0 %
LYMPHS ABS: 2 10*3/uL (ref 0.7–4.0)
Lymphocytes Relative: 27 %
MCH: 28.6 pg (ref 26.0–34.0)
MCHC: 32.5 g/dL (ref 30.0–36.0)
MCV: 87.8 fL (ref 80.0–100.0)
MONO ABS: 0.6 10*3/uL (ref 0.1–1.0)
Monocytes Relative: 8 %
NRBC: 0 % (ref 0.0–0.2)
Neutro Abs: 4.6 10*3/uL (ref 1.7–7.7)
Neutrophils Relative %: 63 %
Platelets: 346 10*3/uL (ref 150–400)
RBC: 4.69 MIL/uL (ref 3.87–5.11)
RDW: 13.6 % (ref 11.5–15.5)
WBC: 7.4 10*3/uL (ref 4.0–10.5)

## 2018-06-16 LAB — COMPREHENSIVE METABOLIC PANEL
ALBUMIN: 4 g/dL (ref 3.5–5.0)
ALT: 28 U/L (ref 0–44)
ANION GAP: 10 (ref 5–15)
AST: 25 U/L (ref 15–41)
Alkaline Phosphatase: 74 U/L (ref 38–126)
BILIRUBIN TOTAL: 0.9 mg/dL (ref 0.3–1.2)
BUN: 16 mg/dL (ref 6–20)
CALCIUM: 9 mg/dL (ref 8.9–10.3)
CO2: 26 mmol/L (ref 22–32)
Chloride: 102 mmol/L (ref 98–111)
Creatinine, Ser: 0.87 mg/dL (ref 0.44–1.00)
GLUCOSE: 106 mg/dL — AB (ref 70–99)
POTASSIUM: 3.3 mmol/L — AB (ref 3.5–5.1)
Sodium: 138 mmol/L (ref 135–145)
TOTAL PROTEIN: 7.3 g/dL (ref 6.5–8.1)

## 2018-06-16 LAB — HEMOGLOBIN A1C
HEMOGLOBIN A1C: 5.3 % (ref 4.8–5.6)
Mean Plasma Glucose: 105.41 mg/dL

## 2018-06-16 LAB — LIPID PANEL
CHOL/HDL RATIO: 4.3 ratio
CHOLESTEROL: 233 mg/dL — AB (ref 0–200)
HDL: 54 mg/dL (ref >40)
LDL Cholesterol: 163 mg/dL — ABNORMAL HIGH (ref 0–99)
Triglycerides: 78 mg/dL (ref <150)
VLDL: 16 mg/dL (ref 0–40)

## 2018-06-16 LAB — TSH: TSH: 3.62 u[IU]/mL (ref 0.350–4.500)

## 2018-06-17 LAB — VITAMIN D 25 HYDROXY (VIT D DEFICIENCY, FRACTURES): Vit D, 25-Hydroxy: 56.6 ng/mL (ref 30.0–100.0)

## 2018-06-22 ENCOUNTER — Encounter: Payer: Self-pay | Admitting: Physician Assistant

## 2018-06-26 LAB — MISC LABCORP TEST (SEND OUT): LABCORP TEST CODE: 501739

## 2018-06-29 ENCOUNTER — Encounter: Payer: Self-pay | Admitting: Physician Assistant

## 2018-06-29 ENCOUNTER — Telehealth: Payer: Self-pay

## 2018-06-29 DIAGNOSIS — E278 Other specified disorders of adrenal gland: Secondary | ICD-10-CM

## 2018-06-29 DIAGNOSIS — R7989 Other specified abnormal findings of blood chemistry: Secondary | ICD-10-CM

## 2018-06-29 NOTE — Telephone Encounter (Signed)
-----   Message from Mar Daring, [place] sent at 06/29/2018  7:40 AM EST ----- Hormonal panel is completely normal. Not menopausal. Thyroid is normal. Normal levels of testosterone. Normal levels of progesterone, estrogen, FSH, LH. DHEA also normal.

## 2018-06-29 NOTE — Telephone Encounter (Signed)
Viewed by Achilles Dunk on 06/29/2018 8:02 AM

## 2018-06-30 DIAGNOSIS — E278 Other specified disorders of adrenal gland: Secondary | ICD-10-CM | POA: Diagnosis not present

## 2018-07-01 ENCOUNTER — Telehealth: Payer: Self-pay

## 2018-07-01 ENCOUNTER — Encounter: Payer: Self-pay | Admitting: Physician Assistant

## 2018-07-01 DIAGNOSIS — R7989 Other specified abnormal findings of blood chemistry: Secondary | ICD-10-CM

## 2018-07-01 DIAGNOSIS — E278 Other specified disorders of adrenal gland: Secondary | ICD-10-CM

## 2018-07-01 LAB — CORTISOL: Cortisol: 10.9 ug/dL

## 2018-07-01 NOTE — Telephone Encounter (Signed)
Patient advised she states that she responded back to you through email.[email or web address]

## 2018-07-01 NOTE — Telephone Encounter (Signed)
Viewed by Achilles Dunk on 07/01/2018 12:21 PM

## 2018-07-01 NOTE — Telephone Encounter (Signed)
-----   Message from Mar Daring, [place] sent at 07/01/2018  8:35 AM EST ----- Cortisol is normal. We can get a pelvic and transvaginal US if desired to further evaluate for PCOS for the elevated DHEA-S if desired.

## 2018-07-06 NOTE — Addendum Note (Signed)
Addended by: Mar Daring on: 07/06/2018 08:23 AM   Modules accepted: Orders

## 2018-07-14 ENCOUNTER — Ambulatory Visit
Admission: RE | Admit: 2018-07-14 | Discharge: 2018-07-14 | Disposition: A | Discharge status: Home / Self Care | Payer: 59 | Source: Ambulatory Visit | Attending: Physician Assistant | Admitting: Physician Assistant

## 2018-07-14 DIAGNOSIS — Z1231 Encounter for screening mammogram for malignant neoplasm of breast: Secondary | ICD-10-CM | POA: Diagnosis not present

## 2018-07-14 DIAGNOSIS — Z1239 Encounter for other screening for malignant neoplasm of breast: Secondary | ICD-10-CM | POA: Insufficient documentation

## 2018-07-17 ENCOUNTER — Encounter: Payer: Self-pay | Admitting: Physician Assistant

## 2018-07-17 DIAGNOSIS — R5383 Other fatigue: Secondary | ICD-10-CM

## 2018-07-17 DIAGNOSIS — R635 Abnormal weight gain: Secondary | ICD-10-CM

## 2018-07-17 DIAGNOSIS — R400 Somnolence: Secondary | ICD-10-CM

## 2018-07-17 DIAGNOSIS — R0683 Snoring: Secondary | ICD-10-CM

## 2018-08-19 ENCOUNTER — Ambulatory Visit: Payer: 59 | Attending: Otolaryngology

## 2018-08-19 DIAGNOSIS — R5383 Other fatigue: Secondary | ICD-10-CM | POA: Insufficient documentation

## 2018-08-19 DIAGNOSIS — R400 Somnolence: Secondary | ICD-10-CM | POA: Insufficient documentation

## 2018-08-19 DIAGNOSIS — R0683 Snoring: Secondary | ICD-10-CM | POA: Insufficient documentation

## 2018-08-19 DIAGNOSIS — R635 Abnormal weight gain: Secondary | ICD-10-CM | POA: Insufficient documentation

## 2018-08-19 DIAGNOSIS — G4730 Sleep apnea, unspecified: Secondary | ICD-10-CM | POA: Diagnosis not present

## 2018-08-26 ENCOUNTER — Encounter: Payer: Self-pay | Admitting: Physician Assistant

## 2019-01-05 DIAGNOSIS — Z833 Family history of diabetes mellitus: Secondary | ICD-10-CM | POA: Diagnosis not present

## 2019-01-05 DIAGNOSIS — I10 Essential (primary) hypertension: Secondary | ICD-10-CM | POA: Diagnosis not present

## 2019-01-05 DIAGNOSIS — L659 Nonscarring hair loss, unspecified: Secondary | ICD-10-CM | POA: Diagnosis not present

## 2019-01-05 DIAGNOSIS — E876 Hypokalemia: Secondary | ICD-10-CM | POA: Diagnosis not present

## 2019-01-05 DIAGNOSIS — E669 Obesity, unspecified: Secondary | ICD-10-CM | POA: Diagnosis not present

## 2019-01-05 DIAGNOSIS — L680 Hirsutism: Secondary | ICD-10-CM | POA: Diagnosis not present

## 2019-01-05 DIAGNOSIS — E278 Other specified disorders of adrenal gland: Secondary | ICD-10-CM | POA: Diagnosis not present

## 2019-01-05 DIAGNOSIS — R635 Abnormal weight gain: Secondary | ICD-10-CM | POA: Diagnosis not present

## 2019-01-05 DIAGNOSIS — L709 Acne, unspecified: Secondary | ICD-10-CM | POA: Diagnosis not present

## 2019-01-07 DIAGNOSIS — L709 Acne, unspecified: Secondary | ICD-10-CM | POA: Diagnosis not present

## 2019-01-07 DIAGNOSIS — R635 Abnormal weight gain: Secondary | ICD-10-CM | POA: Diagnosis not present

## 2019-01-07 DIAGNOSIS — E669 Obesity, unspecified: Secondary | ICD-10-CM | POA: Diagnosis not present

## 2019-01-07 DIAGNOSIS — L680 Hirsutism: Secondary | ICD-10-CM | POA: Diagnosis not present

## 2019-01-08 DIAGNOSIS — L709 Acne, unspecified: Secondary | ICD-10-CM | POA: Diagnosis not present

## 2019-01-08 DIAGNOSIS — L680 Hirsutism: Secondary | ICD-10-CM | POA: Diagnosis not present

## 2019-01-08 DIAGNOSIS — R635 Abnormal weight gain: Secondary | ICD-10-CM | POA: Diagnosis not present

## 2019-01-08 DIAGNOSIS — E669 Obesity, unspecified: Secondary | ICD-10-CM | POA: Diagnosis not present

## 2019-01-21 ENCOUNTER — Other Ambulatory Visit: Payer: Self-pay | Admitting: Internal Medicine

## 2019-01-21 DIAGNOSIS — E876 Hypokalemia: Secondary | ICD-10-CM

## 2019-01-21 DIAGNOSIS — R799 Abnormal finding of blood chemistry, unspecified: Secondary | ICD-10-CM

## 2019-01-21 DIAGNOSIS — I10 Essential (primary) hypertension: Secondary | ICD-10-CM

## 2019-01-27 DIAGNOSIS — E278 Other specified disorders of adrenal gland: Secondary | ICD-10-CM | POA: Diagnosis not present

## 2019-02-04 ENCOUNTER — Ambulatory Visit
Admission: RE | Admit: 2019-02-04 | Discharge: 2019-02-04 | Disposition: A | Discharge status: Home / Self Care | Payer: 59 | Source: Ambulatory Visit | Attending: Internal Medicine | Admitting: Internal Medicine

## 2019-02-04 DIAGNOSIS — I10 Essential (primary) hypertension: Secondary | ICD-10-CM | POA: Diagnosis not present

## 2019-02-04 DIAGNOSIS — R799 Abnormal finding of blood chemistry, unspecified: Secondary | ICD-10-CM

## 2019-02-04 DIAGNOSIS — E876 Hypokalemia: Secondary | ICD-10-CM

## 2019-02-16 ENCOUNTER — Other Ambulatory Visit: Payer: Self-pay | Admitting: Internal Medicine

## 2019-02-16 DIAGNOSIS — I10 Essential (primary) hypertension: Secondary | ICD-10-CM | POA: Diagnosis not present

## 2019-02-16 DIAGNOSIS — E278 Other specified disorders of adrenal gland: Secondary | ICD-10-CM

## 2019-02-16 DIAGNOSIS — E876 Hypokalemia: Secondary | ICD-10-CM | POA: Diagnosis not present

## 2019-02-16 DIAGNOSIS — E669 Obesity, unspecified: Secondary | ICD-10-CM | POA: Diagnosis not present

## 2019-02-16 DIAGNOSIS — L680 Hirsutism: Secondary | ICD-10-CM | POA: Diagnosis not present

## 2019-02-16 DIAGNOSIS — L709 Acne, unspecified: Secondary | ICD-10-CM | POA: Diagnosis not present

## 2019-02-16 DIAGNOSIS — R635 Abnormal weight gain: Secondary | ICD-10-CM | POA: Diagnosis not present

## 2019-02-16 DIAGNOSIS — R7989 Other specified abnormal findings of blood chemistry: Secondary | ICD-10-CM

## 2019-02-16 DIAGNOSIS — Z8349 Family history of other endocrine, nutritional and metabolic diseases: Secondary | ICD-10-CM | POA: Diagnosis not present

## 2019-02-16 DIAGNOSIS — L659 Nonscarring hair loss, unspecified: Secondary | ICD-10-CM | POA: Diagnosis not present

## 2019-02-16 DIAGNOSIS — Z833 Family history of diabetes mellitus: Secondary | ICD-10-CM | POA: Diagnosis not present

## 2019-02-26 ENCOUNTER — Ambulatory Visit
Admission: RE | Admit: 2019-02-26 | Discharge: 2019-02-26 | Disposition: A | Discharge status: Home / Self Care | Payer: 59 | Source: Ambulatory Visit | Attending: Internal Medicine | Admitting: Internal Medicine

## 2019-02-26 DIAGNOSIS — L680 Hirsutism: Secondary | ICD-10-CM

## 2019-02-26 DIAGNOSIS — E278 Other specified disorders of adrenal gland: Secondary | ICD-10-CM

## 2019-02-26 DIAGNOSIS — Z8742 Personal history of other diseases of the female genital tract: Secondary | ICD-10-CM | POA: Diagnosis not present

## 2019-02-26 DIAGNOSIS — R7989 Other specified abnormal findings of blood chemistry: Secondary | ICD-10-CM

## 2019-03-19 DIAGNOSIS — E278 Other specified disorders of adrenal gland: Secondary | ICD-10-CM | POA: Diagnosis not present

## 2019-03-31 ENCOUNTER — Other Ambulatory Visit: Payer: Self-pay | Admitting: Internal Medicine

## 2019-03-31 DIAGNOSIS — R7989 Other specified abnormal findings of blood chemistry: Secondary | ICD-10-CM

## 2019-03-31 DIAGNOSIS — E278 Other specified disorders of adrenal gland: Secondary | ICD-10-CM

## 2019-04-09 ENCOUNTER — Other Ambulatory Visit: Payer: Self-pay

## 2019-04-09 ENCOUNTER — Ambulatory Visit
Admission: RE | Admit: 2019-04-09 | Discharge: 2019-04-09 | Disposition: A | Discharge status: Home / Self Care | Payer: 59 | Source: Ambulatory Visit | Attending: Internal Medicine | Admitting: Internal Medicine

## 2019-04-09 DIAGNOSIS — N200 Calculus of kidney: Secondary | ICD-10-CM | POA: Diagnosis not present

## 2019-04-09 DIAGNOSIS — E278 Other specified disorders of adrenal gland: Secondary | ICD-10-CM

## 2019-04-09 DIAGNOSIS — R7989 Other specified abnormal findings of blood chemistry: Secondary | ICD-10-CM

## 2019-04-16 DIAGNOSIS — L709 Acne, unspecified: Secondary | ICD-10-CM | POA: Diagnosis not present

## 2019-04-16 DIAGNOSIS — L659 Nonscarring hair loss, unspecified: Secondary | ICD-10-CM | POA: Diagnosis not present

## 2019-04-16 DIAGNOSIS — L680 Hirsutism: Secondary | ICD-10-CM | POA: Diagnosis not present

## 2019-04-16 DIAGNOSIS — E278 Other specified disorders of adrenal gland: Secondary | ICD-10-CM | POA: Diagnosis not present

## 2019-04-16 DIAGNOSIS — I10 Essential (primary) hypertension: Secondary | ICD-10-CM | POA: Diagnosis not present

## 2019-04-16 DIAGNOSIS — Z833 Family history of diabetes mellitus: Secondary | ICD-10-CM | POA: Diagnosis not present

## 2019-04-16 DIAGNOSIS — R635 Abnormal weight gain: Secondary | ICD-10-CM | POA: Diagnosis not present

## 2019-04-16 DIAGNOSIS — E669 Obesity, unspecified: Secondary | ICD-10-CM | POA: Diagnosis not present

## 2019-04-16 DIAGNOSIS — E876 Hypokalemia: Secondary | ICD-10-CM | POA: Diagnosis not present

## 2019-04-19 ENCOUNTER — Encounter: Payer: Self-pay | Admitting: Physician Assistant

## 2019-04-20 ENCOUNTER — Encounter: Payer: Self-pay | Admitting: Physician Assistant

## 2019-04-20 DIAGNOSIS — L989 Disorder of the skin and subcutaneous tissue, unspecified: Secondary | ICD-10-CM

## 2019-04-20 DIAGNOSIS — I872 Venous insufficiency (chronic) (peripheral): Secondary | ICD-10-CM

## 2019-04-22 ENCOUNTER — Encounter: Payer: Self-pay | Admitting: Physician Assistant

## 2019-04-22 DIAGNOSIS — R82994 Hypercalciuria: Secondary | ICD-10-CM

## 2019-04-23 NOTE — Addendum Note (Signed)
Addended by: Mar Daring on: 04/23/2019 08:55 AM   Modules accepted: Orders

## 2019-04-30 DIAGNOSIS — I10 Essential (primary) hypertension: Secondary | ICD-10-CM | POA: Diagnosis not present

## 2019-04-30 DIAGNOSIS — L680 Hirsutism: Secondary | ICD-10-CM | POA: Diagnosis not present

## 2019-04-30 DIAGNOSIS — R82994 Hypercalciuria: Secondary | ICD-10-CM | POA: Diagnosis not present

## 2019-04-30 DIAGNOSIS — E876 Hypokalemia: Secondary | ICD-10-CM | POA: Diagnosis not present

## 2019-05-01 LAB — PTH, INTACT AND CALCIUM
Calcium: 9.2 mg/dL (ref 8.7–10.2)
PTH: 27 pg/mL (ref 15–65)

## 2019-05-03 ENCOUNTER — Telehealth: Payer: Self-pay

## 2019-05-03 NOTE — Telephone Encounter (Signed)
-----   Message from Mar Daring, PA-C sent at 05/03/2019 10:50 AM EDT ----- PTH and intact calcium are normal.

## 2019-05-03 NOTE — Telephone Encounter (Signed)
Viewed by Achilles Dunk on 05/03/2019 11:55 AM Written by Mar Daring, PA-C on 05/03/2019 10:50 AM PTH and intact calcium are normal.

## 2019-05-24 DIAGNOSIS — I10 Essential (primary) hypertension: Secondary | ICD-10-CM | POA: Diagnosis not present

## 2019-05-24 DIAGNOSIS — E876 Hypokalemia: Secondary | ICD-10-CM | POA: Diagnosis not present

## 2019-05-24 DIAGNOSIS — L680 Hirsutism: Secondary | ICD-10-CM | POA: Diagnosis not present

## 2019-05-27 DIAGNOSIS — I10 Essential (primary) hypertension: Secondary | ICD-10-CM | POA: Diagnosis not present

## 2019-05-27 DIAGNOSIS — L680 Hirsutism: Secondary | ICD-10-CM | POA: Diagnosis not present

## 2019-05-27 DIAGNOSIS — E669 Obesity, unspecified: Secondary | ICD-10-CM | POA: Diagnosis not present

## 2019-05-27 DIAGNOSIS — E278 Other specified disorders of adrenal gland: Secondary | ICD-10-CM | POA: Diagnosis not present

## 2019-05-27 DIAGNOSIS — Z833 Family history of diabetes mellitus: Secondary | ICD-10-CM | POA: Diagnosis not present

## 2019-05-27 DIAGNOSIS — L659 Nonscarring hair loss, unspecified: Secondary | ICD-10-CM | POA: Diagnosis not present

## 2019-05-27 DIAGNOSIS — R7301 Impaired fasting glucose: Secondary | ICD-10-CM | POA: Diagnosis not present

## 2019-05-27 DIAGNOSIS — L709 Acne, unspecified: Secondary | ICD-10-CM | POA: Diagnosis not present

## 2019-05-27 DIAGNOSIS — R635 Abnormal weight gain: Secondary | ICD-10-CM | POA: Diagnosis not present

## 2019-05-28 ENCOUNTER — Other Ambulatory Visit: Payer: Self-pay

## 2019-05-28 ENCOUNTER — Ambulatory Visit (INDEPENDENT_AMBULATORY_CARE_PROVIDER_SITE_OTHER): Payer: 59 | Admitting: Vascular Surgery

## 2019-05-28 ENCOUNTER — Encounter (INDEPENDENT_AMBULATORY_CARE_PROVIDER_SITE_OTHER): Payer: Self-pay | Admitting: Vascular Surgery

## 2019-05-28 DIAGNOSIS — I10 Essential (primary) hypertension: Secondary | ICD-10-CM | POA: Insufficient documentation

## 2019-05-28 DIAGNOSIS — I83813 Varicose veins of bilateral lower extremities with pain: Secondary | ICD-10-CM | POA: Diagnosis not present

## 2019-05-28 DIAGNOSIS — M7989 Other specified soft tissue disorders: Secondary | ICD-10-CM | POA: Insufficient documentation

## 2019-05-28 NOTE — Patient Instructions (Signed)
Varicose Veins Varicose veins are veins that have become enlarged, bulged, and twisted. They most often appear in the legs. What are the causes? This condition is caused by damage to the valves in the vein. These valves help blood return to your heart. When they are damaged and they stop working properly, blood may flow backward and back up in the veins near the skin, causing the veins to get larger and appear twisted. The condition can result from any issue that causes blood to back up, like pregnancy, prolonged standing, or obesity. What increases the risk? This condition is more likely to develop in people who are:  On their feet a lot.  Pregnant.  Overweight. What are the signs or symptoms? Symptoms of this condition include:  Bulging, twisted, and bluish veins.  A feeling of heaviness. This may be worse at the end of the day.  Leg pain. This may be worse at the end of the day.  Swelling in the leg.  Changes in skin color over the veins. How is this diagnosed? This condition may be diagnosed based on your symptoms, a physical exam, and an ultrasound test. How is this treated? Treatment for this condition may involve:  Avoiding sitting or standing in one position for long periods of time.  Wearing compression stockings. These stockings help to prevent blood clots and reduce swelling in the legs.  Raising (elevating) the legs when resting.  Losing weight.  Exercising regularly. If you have persistent symptoms or want to improve the way your varicose veins look, you may choose to have a procedure to close the varicose veins off or to remove them. Treatments to close off the veins include:  Sclerotherapy. In this treatment, a solution is injected into a vein to close it off.  Laser treatment. In this treatment, the vein is heated with a laser to close it off.  Radiofrequency vein ablation. In this treatment, an electrical current produced by radio waves is used to close  off the vein. Treatments to remove the veins include:  Phlebectomy. In this treatment, the veins are removed through small incisions made over the veins.  Vein ligation and stripping. In this treatment, incisions are made over the veins. The veins are then removed after being tied (ligated) with stitches (sutures). Follow these instructions at home: Activity  Walk as much as possible. Walking increases blood flow. This helps blood return to the heart and takes pressure off your veins. It also increases your cardiovascular strength.  Follow your health care provider's instructions about exercising.  Do not stand or sit in one position for a long period of time.  Do not sit with your legs crossed.  Rest with your legs raised during the day. General instructions   Follow any diet instructions given to you by your health care provider.  Wear compression stockings as directed by your health care provider. Do not wear other kinds of tight clothing around your legs, pelvis, or waist.  Elevate your legs at night to above the level of your heart.  If you get a cut in the skin over the varicose vein and the vein bleeds: ? Lie down with your leg raised. ? Apply firm pressure to the cut with a clean cloth until the bleeding stops. ? Place a bandage (dressing) on the cut. Contact a health care provider if:  The skin around your varicose veins starts to break down.  You have pain, redness, tenderness, or hard swelling over a vein.  You   are uncomfortable because of pain.  You get a cut in the skin over a varicose vein and it will not stop bleeding. Summary  Varicose veins are veins that have become enlarged, bulged, and twisted. They most often appear in the legs.  This condition is caused by damage to the valves in the vein. These valves help blood return to your heart.  Treatment for this condition includes frequent movements, wearing compression stockings, losing weight, and  exercising regularly. In some cases, procedures are done to close off or remove the veins.  Treatment for this condition may include wearing compression stockings, elevating the legs, losing weight, and engaging in regular activity. In some cases, procedures are done to close off or remove the veins. This information is not intended to replace advice given to you by your health care provider. Make sure you discuss any questions you have with your health care provider. Document Released: 04/24/2005 Document Revised: 09/10/2018 Document Reviewed: 08/07/2016 Elsevier Patient Education  2020 [employer]  

## 2019-05-28 NOTE — Assessment & Plan Note (Signed)
Venous work-up will be performed to evaluate for reflux issues causing potential symptoms.  Conservative measures as described above.  We will see her back following the ultrasound.

## 2019-05-28 NOTE — Assessment & Plan Note (Signed)
blood pressure control important in reducing the progression of atherosclerotic disease. On appropriate oral medications.  

## 2019-05-28 NOTE — Progress Notes (Signed)
Patient ID: Audrey Koch, female   DOB: Sep 01, 1970, 48 y.o.   MRN: [id number]  Chief Complaint  Patient presents with  . New Patient (Initial Visit)    venous insufficiency bilateral    HPI Audrey Koch is a 48 y.o. female.  I am asked to see the patient by Joette Catching, PA-C for evaluation of venous insufficiency.  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  Over the past year or 2 she has had increasing pain and swelling in her legs.  The right lower extremity is the more severely affected the 2 legs.  No personal history of DVT or thrombophlebitis.  She says her mother and her grandmother did have vein issues and phlebitis before they died.  No trauma, injury, or other inciting event that started the symptoms.  She is also noticed increasing varicosities particularly in the right leg but also in the left leg.  The swelling progresses throughout the day.  She has to stand a lot with her job.  She reports elevation and rest seem to make her legs better.  She also says she gets shinsplints when she walks vigorously..     Past Medical History:  Diagnosis Date  . Hyperlipidemia   . Hypertension   . Migraine     Past Surgical History:  Procedure Laterality Date  . HEMORROIDECTOMY  10/08/13  . NO PAST SURGERIES       Family History  Problem Relation Age of Onset  . Heart attack Mother   . Hyperlipidemia Mother   . Dementia Maternal Grandmother   . Diabetes Maternal Grandfather   . Dementia Paternal Grandmother   . Diabetes Paternal Grandfather   . Esophageal cancer Paternal Grandfather   . Diabetes Brother   . Breast cancer Neg Hx      Social History   Tobacco Use  . Smoking status: Never Smoker  . Smokeless tobacco: Never Used  Substance Use Topics  . Alcohol use: No  . Drug use: No    Allergies  Allergen Reactions  . Sulfa Antibiotics Rash    Current  Outpatient Medications  Medication Sig Dispense Refill  . ibuprofen (ADVIL,MOTRIN) 600 MG tablet Take 1 tablet (600 mg total) by mouth every 6 (six) hours as needed. 30 tablet 0  . metoprolol succinate (TOPROL-XL) 25 MG 24 hr tablet TAKE 1 TABLET BY MOUTH DAILY 90 tablet 1  . Multiple Vitamins-Minerals (VITAMIN D3 COMPLETE PO) Take by mouth.    . spironolactone (ALDACTONE) 25 MG tablet 50 mg.     . triamterene-hydrochlorothiazide (MAXZIDE-25) 37.5-25 MG tablet TAKE 1 TABLET BY MOUTH DAILY. (Patient not taking: Reported on 05/28/2019) 90 tablet 3   No current facility-administered medications for this visit.       REVIEW OF SYSTEMS (Negative unless checked)  Constitutional: [] Weight loss  [] Fever  [] Chills Cardiac: [] Chest pain   [] Chest pressure   [] Palpitations   [] Shortness of breath when laying flat   [] Shortness of breath at rest   [] Shortness of breath with exertion. Vascular:  [] Pain in legs with walking   [] Pain in legs at rest   [] Pain in legs when laying flat   [] Claudication   [] Pain in feet when walking  [] Pain in feet at rest  [] Pain in feet when laying flat   [] History of DVT   [] Phlebitis   [x] Swelling in legs   [  x]Varicose veins   [] Non-healing ulcers Pulmonary:   [] Uses home oxygen   [] Productive cough   [] Hemoptysis   [] Wheeze  [] COPD   [] Asthma Neurologic:  [] Dizziness  [] Blackouts   [] Seizures   [] History of stroke   [] History of TIA  [] Aphasia   [] Temporary blindness   [] Dysphagia   [] Weakness or numbness in arms   [] Weakness or numbness in legs Musculoskeletal:  [] Arthritis   [] Joint swelling   [] Joint pain   [] Low back pain Hematologic:  [] Easy bruising  [] Easy bleeding   [] Hypercoagulable state   [] Anemic  [] Hepatitis Gastrointestinal:  [] Blood in stool   [] Vomiting blood  [] Gastroesophageal reflux/heartburn   [] Abdominal pain Genitourinary:  [] Chronic kidney disease   [] Difficult urination  [] Frequent urination  [] Burning with urination   [] Hematuria Skin:  [] Rashes    [] Ulcers   [] Wounds Psychological:  [] History of anxiety   []  History of major depression.    Physical Exam BP 120/83 (BP Location: Right Arm)   Pulse 76   Resp 16   Ht 5\' 2"  (1.575 m)   Wt 186 lb (84.4 kg)   BMI 34.02 kg/m  Gen:  WD/WN, NAD.  Appears younger than stated age Head: [place]/AT, No temporalis wasting.  Ear/Nose/Throat: Hearing grossly intact, nares w/o erythema or drainage, oropharynx w/o Erythema/Exudate Eyes: Conjunctiva clear, sclera non-icteric  Neck: trachea midline.  No JVD.  Pulmonary:  Good air movement, respirations not labored, no use of accessory muscles  Cardiac: RRR, no JVD Vascular:  Vessel Right Left  Radial Palpable Palpable                          DP  2+  2+  PT  1+  2+   Gastrointestinal:. No masses, surgical incisions, or scars. Musculoskeletal: M/S 5/5 throughout.  Extremities without ischemic changes.  No deformity or atrophy.  1+ right lower extremity edema, trace left lower extremity edema.  Fairly diffuse varicosities on the right leg measuring about 2 mm.  More scattered varicosities on the left leg. Neurologic: Sensation grossly intact in extremities.  Symmetrical.  Speech is fluent. Motor exam as listed above. Psychiatric: Judgment intact, Mood & affect appropriate for pt's clinical situation. Dermatologic: No rashes or ulcers noted.  No cellulitis or open wounds.    Radiology No results found.  Labs Recent Results (from the past 2160 hour(s))  PTH, Intact and Calcium     Status: None   Collection Time: 04/30/19  8:10 AM  Result Value Ref Range   Calcium 9.2 8.7 - 10.2 mg/dL   PTH 27 15 - 65 pg/mL   PTH Interp Comment     Comment: Interpretation                 Intact PTH    Calcium                                 (pg/mL)      (mg/dL) Normal                          15 - 65     8.6 - 10.2 Primary Hyperparathyroidism         >65          >10.2 Secondary Hyperparathyroidism       >65          <10.2 Non-Parathyroid  Hypercalcemia       <65          >10.2 Hypoparathyroidism                  <15          < 8.6 Non-Parathyroid Hypocalcemia    15 - 65          < 8.6     Assessment/Plan:  Essential hypertension blood pressure control important in reducing the progression of atherosclerotic disease. On appropriate oral medications.   Swelling of limb I have had a long discussion with the patient regarding swelling and why it  causes symptoms.  Patient will begin wearing graduated compression stockings class 1 (20-30 mmHg) on a daily basis a prescription was given. The patient will  beginning wearing the stockings first thing in the morning and removing them in the evening. The patient is instructed specifically not to sleep in the stockings.   In addition, behavioral modification will be initiated.  This will include frequent elevation, use of over the counter pain medications and exercise such as walking.  I have reviewed systemic causes for chronic edema such as liver, kidney and cardiac etiologies.  The patient denies problems with these organ systems.    Consideration for a lymph pump will also be made based upon the effectiveness of conservative therapy.  This would help to improve the edema control and prevent sequela such as ulcers and infections   Patient should undergo duplex ultrasound of the venous system to ensure that DVT or reflux is not present.  The patient will follow-up with me after the ultrasound.    Varicose veins of leg with pain, bilateral Venous work-up will be performed to evaluate for reflux issues causing potential symptoms.  Conservative measures as described above.  We will see her back following the ultrasound.      Leotis Pain 05/28/2019, 9:38 AM   This note was created with Dragon medical transcription system.  Any errors from dictation are unintentional.

## 2019-05-28 NOTE — Assessment & Plan Note (Signed)

## 2019-06-04 ENCOUNTER — Other Ambulatory Visit: Payer: Self-pay

## 2019-06-04 ENCOUNTER — Ambulatory Visit (INDEPENDENT_AMBULATORY_CARE_PROVIDER_SITE_OTHER): Payer: 59 | Admitting: Nurse Practitioner

## 2019-06-04 ENCOUNTER — Encounter (INDEPENDENT_AMBULATORY_CARE_PROVIDER_SITE_OTHER): Payer: Self-pay | Admitting: Nurse Practitioner

## 2019-06-04 ENCOUNTER — Ambulatory Visit (INDEPENDENT_AMBULATORY_CARE_PROVIDER_SITE_OTHER): Payer: 59

## 2019-06-04 VITALS — BP 128/87 | HR 89 | Resp 16

## 2019-06-04 DIAGNOSIS — I83813 Varicose veins of bilateral lower extremities with pain: Secondary | ICD-10-CM

## 2019-06-04 DIAGNOSIS — I10 Essential (primary) hypertension: Secondary | ICD-10-CM

## 2019-06-04 DIAGNOSIS — M7989 Other specified soft tissue disorders: Secondary | ICD-10-CM

## 2019-06-04 DIAGNOSIS — I890 Lymphedema, not elsewhere classified: Secondary | ICD-10-CM | POA: Diagnosis not present

## 2019-06-04 DIAGNOSIS — R600 Localized edema: Secondary | ICD-10-CM

## 2019-06-07 ENCOUNTER — Encounter (INDEPENDENT_AMBULATORY_CARE_PROVIDER_SITE_OTHER): Payer: Self-pay | Admitting: Nurse Practitioner

## 2019-06-07 NOTE — Progress Notes (Signed)
SUBJECTIVE:  Patient ID: Audrey Koch, female    DOB: [birth year], 48 y.o.   MRN: [id number] Chief Complaint  Patient presents with  . Follow-up    ultrasound follow up    HPI  Audrey Koch is a 48 y.o. female that presents today for follow-up noninvasive studies due to lower extremity edema.  The patient notes that over the past 2 years her lower extremity edema has been getting worse.  She feels as if her right lower extremity is somewhat worse than her left.  She denies any previous history of DVT however does note a strong family history of it.  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  The patient does wear medical grade 1 compression stockings daily as well as elevates as much as possible.  She does note however that she is not active as she normally was prior to taking a management position.  She now finds herself sitting and standing more than being active.  She also does note that she gets shinsplints with vigorous activity.  Today noninvasive studies show that the patient has no evidence of DVT or superficial venous thrombosis bilaterally.  The patient has no evidence of chronic venous insufficiency in her left lower extremity.  The patient does have evidence of reflux in her right small saphenous vein.  Past Medical History:  Diagnosis Date  . Hyperlipidemia   . Hypertension   . Migraine     Past Surgical History:  Procedure Laterality Date  . HEMORROIDECTOMY  10/08/13  . NO PAST SURGERIES      Social History   Socioeconomic History  . Marital status: Married    Spouse name: Not on file  . Number of children: Not on file  . Years of education: Not on file  . Highest education level: Not on file  Occupational History  . Not on file  Social Needs  . Financial resource strain: Not on file  . Food insecurity    Worry: Not on file     Inability: Not on file  . Transportation needs    Medical: Not on file    Non-medical: Not on file  Tobacco Use  . Smoking status: Never Smoker  . Smokeless tobacco: Never Used  Substance and Sexual Activity  . Alcohol use: No  . Drug use: No  . Sexual activity: Not on file  Lifestyle  . Physical activity    Days per week: Not on file    Minutes per session: Not on file  . Stress: Not on file  Relationships  . Social Herbalist on phone: Not on file    Gets together: Not on file    Attends religious service: Not on file    Active member of club or organization: Not on file    Attends meetings of clubs or organizations: Not on file    Relationship status: Not on file  . Intimate partner violence    Fear of current or ex partner: Not on file    Emotionally abused: Not on file    Physically abused: Not on file    Forced sexual activity: Not on file  Other Topics Concern  . Not on file  Social History Narrative  . Not on file    Family History  Problem Relation Age of Onset  . Heart attack Mother   .  Hyperlipidemia Mother   . Dementia Maternal Grandmother   . Diabetes Maternal Grandfather   . Dementia Paternal Grandmother   . Diabetes Paternal Grandfather   . Esophageal cancer Paternal Grandfather   . Diabetes Brother   . Breast cancer Neg Hx     Allergies  Allergen Reactions  . Sulfa Antibiotics Rash     Review of Systems   Review of Systems: Negative Unless Checked Constitutional: [] Weight loss  [] Fever  [] Chills Cardiac: [] Chest pain   []  Atrial Fibrillation  [] Palpitations   [] Shortness of breath when laying flat   [] Shortness of breath with exertion. [] Shortness of breath at rest Vascular:  [] Pain in legs with walking   [] Pain in legs with standing [] Pain in legs when laying flat   [] Claudication    [] Pain in feet when laying flat    [] History of DVT   [] Phlebitis   [x] Swelling in legs   [x] Varicose veins   [] Non-healing ulcers Pulmonary:    [] Uses home oxygen   [] Productive cough   [] Hemoptysis   [] Wheeze  [] COPD   [] Asthma Neurologic:  [] Dizziness   [] Seizures  [] Blackouts [] History of stroke   [] History of TIA  [] Aphasia   [] Temporary Blindness   [] Weakness or numbness in arm   [] Weakness or numbness in leg Musculoskeletal:   [] Joint swelling   [] Joint pain   [] Low back pain  []  History of Knee Replacement [] Arthritis [] back Surgeries  []  Spinal Stenosis    Hematologic:  [] Easy bruising  [] Easy bleeding   [] Hypercoagulable state   [] Anemic Gastrointestinal:  [] Diarrhea   [] Vomiting  [] Gastroesophageal reflux/heartburn   [] Difficulty swallowing. [] Abdominal pain Genitourinary:  [] Chronic kidney disease   [] Difficult urination  [] Anuric   [] Blood in urine [] Frequent urination  [] Burning with urination   [] Hematuria Skin:  [] Rashes   [] Ulcers [] Wounds Psychological:  [x] History of anxiety   [x]  History of major depression  []  Memory Difficulties      OBJECTIVE:   Physical Exam  BP 128/87 (BP Location: Right Arm)   Pulse 89   Resp 16   Gen: WD/WN, NAD Head: [place]/AT, No temporalis wasting.  Ear/Nose/Throat: Hearing grossly intact, nares w/o erythema or drainage Eyes: PER, EOMI, sclera nonicteric.  Neck: Supple, no masses.  No JVD.  Pulmonary:  Good air movement, no use of accessory muscles.  Cardiac: RRR Vascular:  2+ edema bilaterally, a few scattered varicosities bilaterally Vessel Right Left  Radial Palpable Palpable  Dorsalis Pedis Palpable Palpable  Posterior Tibial Palpable Palpable   Gastrointestinal: soft, non-distended. No guarding/no peritoneal signs.  Musculoskeletal: M/S 5/5 throughout.  No deformity or atrophy.  Neurologic: Pain and light touch intact in extremities.  Symmetrical.  Speech is fluent. Motor exam as listed above. Psychiatric: Judgment intact, Mood & affect appropriate for pt's clinical situation. Dermatologic: No Venous rashes. No Ulcers Noted.  No changes consistent with cellulitis. Lymph : No  Cervical lymphadenopathy, no lichenification or skin changes of chronic lymphedema.       ASSESSMENT AND PLAN:  1. Lymphedema No surgery or intervention at this point in time.    I have reviewed my discussion with the patient regarding venous insufficiency and secondary lymph edema and why it  causes symptoms. I have discussed with the patient the chronic skin changes that accompany these problems and the long term sequela such as ulceration and infection.  Patient will continue wearing graduated compression stockings class 1 (20-30 mmHg) on a daily basis a prescription was given to the patient to keep this  updated. The patient will  put the stockings on first thing in the morning and removing them in the evening. The patient is instructed specifically not to sleep in the stockings.  In addition, behavioral modification including elevation during the day will be continued.  Diet and salt restriction was also discussed.  Previous duplex ultrasound of the lower extremities shows normal deep venous system, superficial reflux was not present.   Following the review of the ultrasound the patient will follow up in 6 months to reassess the degree of swelling and the control that graduated compression is offering.   The patient can be assessed for a Lymph Pump at that time.  However, at this time the patient states they are satisfied with the control compression and elevation is yielding.    2. Essential hypertension Continue antihypertensive medications as already ordered, these medications have been reviewed and there are no changes at this time.   3. Varicose veins of leg with pain, bilateral Recommend:  The patient is complaining of varicose veins.    I have had a long discussion with the patient regarding  varicose veins and why they cause symptoms.  Patient will begin wearing graduated compression stockings on a daily basis, beginning first thing in the morning and removing them in the  evening. The patient is instructed specifically not to sleep in the stockings.    The patient  will also begin using over-the-counter analgesics such as Motrin 600 mg po TID to help control the symptoms as needed.    In addition, behavioral modification including elevation during the day will be initiated, utilizing a recliner was recommended.  The patient is also instructed to continue exercising such as walking 4-5 times per week.  At this time the patient wishes to continue conservative therapy and is not interested in more invasive treatments such as laser ablation and sclerotherapy.     Current Outpatient Medications on File Prior to Visit  Medication Sig Dispense Refill  . ibuprofen (ADVIL,MOTRIN) 600 MG tablet Take 1 tablet (600 mg total) by mouth every 6 (six) hours as needed. 30 tablet 0  . metoprolol succinate (TOPROL-XL) 25 MG 24 hr tablet TAKE 1 TABLET BY MOUTH DAILY 90 tablet 1  . Multiple Vitamins-Minerals (VITAMIN D3 COMPLETE PO) Take by mouth.    . spironolactone (ALDACTONE) 25 MG tablet 50 mg.     . triamterene-hydrochlorothiazide (MAXZIDE-25) 37.5-25 MG tablet TAKE 1 TABLET BY MOUTH DAILY. (Patient not taking: Reported on 05/28/2019) 90 tablet 3   No current facility-administered medications on file prior to visit.     There are no Patient Instructions on file for this visit. No follow-ups on file.   Kris Hartmann, NP  This note was completed with Sales executive.  Any errors are purely unintentional.

## 2019-06-18 ENCOUNTER — Other Ambulatory Visit: Payer: Self-pay

## 2019-06-18 ENCOUNTER — Encounter: Payer: Self-pay | Admitting: Adult Health

## 2019-06-18 ENCOUNTER — Ambulatory Visit (INDEPENDENT_AMBULATORY_CARE_PROVIDER_SITE_OTHER): Payer: 59 | Admitting: Adult Health

## 2019-06-18 ENCOUNTER — Ambulatory Visit
Admission: RE | Admit: 2019-06-18 | Discharge: 2019-06-18 | Disposition: A | Discharge status: Home / Self Care | Payer: 59 | Source: Ambulatory Visit | Attending: Adult Health | Admitting: Adult Health

## 2019-06-18 VITALS — BP 106/80 | HR 86 | Temp 97.3°F | Resp 16 | Wt 189.6 lb

## 2019-06-18 DIAGNOSIS — E278 Other specified disorders of adrenal gland: Secondary | ICD-10-CM | POA: Diagnosis not present

## 2019-06-18 DIAGNOSIS — M7989 Other specified soft tissue disorders: Secondary | ICD-10-CM

## 2019-06-18 DIAGNOSIS — M79631 Pain in right forearm: Secondary | ICD-10-CM

## 2019-06-18 DIAGNOSIS — Z5181 Encounter for therapeutic drug level monitoring: Secondary | ICD-10-CM | POA: Diagnosis not present

## 2019-06-18 DIAGNOSIS — M67442 Ganglion, left hand: Secondary | ICD-10-CM | POA: Diagnosis not present

## 2019-06-18 DIAGNOSIS — L659 Nonscarring hair loss, unspecified: Secondary | ICD-10-CM | POA: Diagnosis not present

## 2019-06-18 DIAGNOSIS — L709 Acne, unspecified: Secondary | ICD-10-CM | POA: Diagnosis not present

## 2019-06-18 NOTE — Patient Instructions (Addendum)
Lymphedema  Lymphedema is swelling that is caused by the abnormal collection of lymph in the tissues under the skin. Lymph is fluid from the tissues in your body that is removed through the lymphatic system. This system is part of your body's defense system (immune system) and includes lymph nodes and lymph vessels. The lymph vessels collect and carry the excess fluid, fats, proteins, and wastes from the tissues of the body to the bloodstream. This system also works to clean and remove bacteria and waste products from the body. Lymphedema occurs when the lymphatic system is blocked. When the lymph vessels or lymph nodes are blocked or damaged, lymph does not drain properly. This causes an abnormal buildup of lymph, which leads to swelling in the affected area. This may include the trunk area, or an arm or leg. Lymphedema cannot be cured by medicines, but various methods can be used to help reduce the swelling. There are two types of lymphedema: primary lymphedema and secondary lymphedema. What are the causes? The cause of this condition depends on the type of lymphedema that you have.  Primary lymphedema is caused by the absence of lymph vessels or having abnormal lymph vessels at birth.  Secondary lymphedema occurs when lymph vessels are blocked or damaged. Secondary lymphedema is more common. Common causes of lymph vessel blockage include: ? Skin infection, such as cellulitis. ? Infection by parasites (filariasis). ? Injury. ? Radiation therapy. ? Cancer. ? Formation of scar tissue. ? Surgery. What are the signs or symptoms? Symptoms of this condition include:  Swelling of the arm or leg.  A heavy or tight feeling in the arm or leg.  Swelling of the feet, toes, or fingers. Shoes or rings may fit more tightly than before.  Redness of the skin over the affected area.  Limited movement of the affected limb.  Sensitivity to touch or discomfort in the affected limb. How is this  diagnosed? This condition may be diagnosed based on:  Your symptoms and medical history.  A physical exam.  Bioimpedance spectroscopy. In this test, painless electrical currents are used to measure fluid levels in your body.  Imaging tests, such as: ? Lymphoscintigraphy. In this test, a low dose of a radioactive substance is injected to trace the flow of lymph through the lymph vessels. ? MRI. ? CT scan. ? Duplex ultrasound. This test uses sound waves to produce images of the vessels and the blood flow on a screen. ? Lymphangiography. In this test, a contrast dye is injected into the lymph vessel to help show blockages. How is this treated? Treatment for this condition may depend on the cause of your lymphedema. Treatment may include:  Complete decongestive therapy (CDT). This is done by a certified lymphedema therapist to reduce fluid congestion. This therapy includes: ? Manual lymph drainage. This is a special massage technique that promotes lymph drainage out of a limb. ? Skin care. ? Compression wrapping of the affected area. ? Specific exercises. Certain exercises can help fluid move out of the affected limb.  Compression. Various methods may be used to apply pressure to the affected limb to reduce the swelling. They include: ? Wearing compression stockings or sleeves on the affected limb. ? Wrapping the affected limb with special bandages.  Surgery. This is usually done for severe cases only. For example, surgery may be done if you have trouble moving the limb or if the swelling does not get better with other treatments. If an underlying condition is causing the  lymphedema, treatment for that condition will be done. For example, antibiotic medicines may be used to treat an infection. Follow these instructions at home: Self-care  The affected area is more likely to become injured or infected. Take these steps to help prevent infection: ? Keep the affected area clean and  dry. ? Use approved creams or lotions to keep the skin moisturized. ? Protect your skin from cuts:  Use gloves while cooking or gardening.  Do not walk barefoot.  If you shave the affected area, use an Copy.  Do not wear tight clothes, shoes, or jewelry.  Eat a healthy diet that includes a lot of fruits and vegetables. Activity  Exercise regularly as directed by your health care provider.  Do not sit with your legs crossed.  When possible, keep the affected limb raised (elevated) above the level of your heart.  Avoid carrying things with an arm that is affected by lymphedema. General instructions  Wear compression stockings or sleeves as told by your health care provider.  Note any changes in size of the affected limb. You may be instructed to take regular measurements and keep track of them.  Take over-the-counter and prescription medicines only as told by your health care provider.  If you were prescribed an antibiotic medicine, take or apply it as told by your health care provider. Do not stop using the antibiotic even if you start to feel better.  Do not use heating pads or ice packs over the affected area.  Avoid having blood draws, IV insertions, or blood pressure checked on the affected limb.  Keep all follow-up visits as told by your health care provider. This is important. Contact a health care provider if you:  Continue to have swelling in your limb.  Have a cut that does not heal.  Have redness or pain in the affected area. Get help right away if you:  Have new swelling in your limb that comes on suddenly.  Develop purplish spots, rash or sores (lesions) on your affected limb.  Have shortness of breath.  Have a fever or chills. Summary  Lymphedema is swelling that is caused by the abnormal collection of lymph in the tissues under the skin.  Lymph is fluid from the tissues in your body that is removed through the lymphatic system. This  system collects and carries excess fluid, fats, proteins, and wastes from the tissues of the body to the bloodstream.  Lymphedema causes swelling, pain, and redness in the affected area. This may include the trunk area, or an arm or leg.  Treatment for this condition may depend on the cause of your lymphedema. Treatment may include complete decongestive therapy (CDT), compression methods, surgery, or treating the underlying cause. This information is not intended to replace advice given to you by your health care provider. Make sure you discuss any questions you have with your health care provider. Document Released: 05/12/2007 Document Revised: 07/28/2017 Document Reviewed: 07/28/2017 Elsevier Patient Education  2020 [place].  Ganglion Cyst  A ganglion cyst is a non-cancerous, fluid-filled lump that occurs near a joint or tendon. The cyst grows out of a joint or the lining of a tendon. Ganglion cysts most often develop in the hand or wrist, but they can also develop in the shoulder, elbow, hip, knee, ankle, or foot. Ganglion cysts are ball-shaped or egg-shaped. Their size can range from the size of a pea to larger than a grape. Increased activity may cause the cyst to get bigger because  more fluid starts to build up. What are the causes? The exact cause of this condition is not known, but it may be related to:  Inflammation or irritation around the joint.  An injury.  Repetitive movements or overuse.  Arthritis. What increases the risk? You are more likely to develop this condition if:  You are a woman.  You are 67-38 years old. What are the signs or symptoms? The main symptom of this condition is a lump. It most often appears on the hand or wrist. In many cases, there are no other symptoms, but a cyst can sometimes cause:  Tingling.  Pain.  Numbness.  Muscle weakness.  Weak grip.  Less range of motion in a joint. How is this diagnosed? Ganglion cysts are usually  diagnosed based on a physical exam. Your health care provider will feel the lump and may shine a light next to it. If it is a ganglion cyst, the light will likely shine through it. Your health care provider may order an X-ray, ultrasound, or MRI to rule out other conditions. How is this treated? Ganglion cysts often go away on their own without treatment. If you have pain or other symptoms, treatment may be needed. Treatment is also needed if the ganglion cyst limits your movement or if it gets infected. Treatment may include:  Wearing a brace or splint on your wrist or finger.  Taking anti-inflammatory medicine.  Having fluid drained from the lump with a needle (aspiration).  Getting a steroid injected into the joint.  Having surgery to remove the ganglion cyst.  Placing a pad on your shoe or wearing shoes that will not rub against the cyst if it is on your foot. Follow these instructions at home:  Do not press on the ganglion cyst, poke it with a needle, or hit it.  Take over-the-counter and prescription medicines only as told by your health care provider.  If you have a brace or splint: ? Wear it as told by your health care provider. ? Remove it as told by your health care provider. Ask if you need to remove it when you take a shower or a bath.  Watch your ganglion cyst for any changes.  Keep all follow-up visits as told by your health care provider. This is important. Contact a health care provider if:  Your ganglion cyst becomes larger or more painful.  You have pus coming from the lump.  You have weakness or numbness in the affected area.  You have a fever or chills. Get help right away if:  You have a fever and have any of these in the cyst area: ? Increased redness. ? Red streaks. ? Swelling. Summary  A ganglion cyst is a non-cancerous, fluid-filled lump that occurs near a joint or tendon.  Ganglion cysts most often develop in the hand or wrist, but they can  also develop in the shoulder, elbow, hip, knee, ankle, or foot.  Ganglion cysts often go away on their own without treatment. This information is not intended to replace advice given to you by your health care provider. Make sure you discuss any questions you have with your health care provider. Document Released: 07/12/2000 Document Revised: 06/27/2017 Document Reviewed: 03/14/2017 Elsevier Patient Education  2020 [employer].
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
## 2019-06-19 LAB — CBC WITH DIFFERENTIAL/PLATELET
Basophils Absolute: 0.1 10*3/uL (ref 0.0–0.2)
Basos: 1 %
EOS (ABSOLUTE): 0.1 10*3/uL (ref 0.0–0.4)
Eos: 1 %
Hematocrit: 40.8 % (ref 34.0–46.6)
Hemoglobin: 13.2 g/dL (ref 11.1–15.9)
Immature Grans (Abs): 0 10*3/uL (ref 0.0–0.1)
Immature Granulocytes: 0 %
Lymphocytes Absolute: 2 10*3/uL (ref 0.7–3.1)
Lymphs: 25 %
MCH: 28.3 pg (ref 26.6–33.0)
MCHC: 32.4 g/dL (ref 31.5–35.7)
MCV: 87 fL (ref 79–97)
Monocytes Absolute: 0.6 10*3/uL (ref 0.1–0.9)
Monocytes: 8 %
Neutrophils Absolute: 5.3 10*3/uL (ref 1.4–7.0)
Neutrophils: 65 %
Platelets: 332 10*3/uL (ref 150–450)
RBC: 4.67 x10E6/uL (ref 3.77–5.28)
RDW: 13.1 % (ref 11.7–15.4)
WBC: 8.1 10*3/uL (ref 3.4–10.8)

## 2019-06-21 ENCOUNTER — Ambulatory Visit: Payer: 59

## 2019-06-21 ENCOUNTER — Encounter: Payer: Self-pay | Admitting: Adult Health

## 2019-06-22 ENCOUNTER — Other Ambulatory Visit: Payer: Self-pay

## 2019-06-22 ENCOUNTER — Ambulatory Visit
Admission: RE | Admit: 2019-06-22 | Discharge: 2019-06-22 | Disposition: A | Discharge status: Home / Self Care | Payer: 59 | Source: Ambulatory Visit | Attending: Adult Health | Admitting: Adult Health

## 2019-06-22 DIAGNOSIS — M79631 Pain in right forearm: Secondary | ICD-10-CM | POA: Diagnosis not present

## 2019-06-22 DIAGNOSIS — M7989 Other specified soft tissue disorders: Secondary | ICD-10-CM | POA: Insufficient documentation

## 2019-06-23 ENCOUNTER — Encounter: Payer: Self-pay | Admitting: Adult Health

## 2019-06-23 ENCOUNTER — Telehealth: Payer: Self-pay

## 2019-06-23 DIAGNOSIS — M79631 Pain in right forearm: Secondary | ICD-10-CM

## 2019-06-23 DIAGNOSIS — R2230 Localized swelling, mass and lump, unspecified upper limb: Secondary | ICD-10-CM

## 2019-06-23 DIAGNOSIS — I872 Venous insufficiency (chronic) (peripheral): Secondary | ICD-10-CM

## 2019-06-23 NOTE — Telephone Encounter (Signed)
[id number] 

## 2019-06-23 NOTE — Telephone Encounter (Signed)
-----   Message from Doreen Beam, [place] sent at 06/23/2019  4:21 PM EST ----- Please let patient know her right forearm ultrasound is normal. Is she doing better with compression, ice, and elevation. If symptom is still persisting I recommend her letting us know and we can discuss.

## 2019-06-23 NOTE — Progress Notes (Signed)
Please let patient know her right forearm ultrasound is normal. Is she doing better with compression, ice, and elevation. If symptom is still persisting I recommend her letting us know and we can discuss.

## 2019-06-25 ENCOUNTER — Ambulatory Visit: Payer: 59

## 2019-06-25 ENCOUNTER — Other Ambulatory Visit: Payer: Self-pay | Admitting: Physician Assistant

## 2019-06-25 DIAGNOSIS — I10 Essential (primary) hypertension: Secondary | ICD-10-CM

## 2019-07-07 NOTE — Addendum Note (Signed)
Addended by: Doreen Beam on: 07/07/2019 04:37 PM   Modules accepted: Orders

## 2019-07-08 ENCOUNTER — Telehealth: Payer: Self-pay

## 2019-07-08 NOTE — Telephone Encounter (Signed)
Patient reminder Message Contents  Flinchum, Kelby Aline, FNP  Minette Headland, CMA  Nunzio Cory,  I ordered venous duplex of patients right arm and just canceled and reordered it - urgent as they never got the first one at [facility] can you check on it ?   Thanks,  Laverna Peace MSN, AGNP-C, FNP-C       Previous Messages

## 2019-07-08 NOTE — Telephone Encounter (Signed)
Left message for referral department to verify if they had received order for venous doppler. If referral department calls back please forward message back to nurse flinchum. Thank You. KW

## 2019-07-12 ENCOUNTER — Ambulatory Visit (INDEPENDENT_AMBULATORY_CARE_PROVIDER_SITE_OTHER): Payer: 59

## 2019-07-12 ENCOUNTER — Other Ambulatory Visit: Payer: Self-pay

## 2019-07-12 DIAGNOSIS — M79631 Pain in right forearm: Secondary | ICD-10-CM | POA: Diagnosis not present

## 2019-07-12 DIAGNOSIS — I872 Venous insufficiency (chronic) (peripheral): Secondary | ICD-10-CM

## 2019-07-20 DIAGNOSIS — M79601 Pain in right arm: Secondary | ICD-10-CM | POA: Diagnosis not present

## 2019-07-20 DIAGNOSIS — M25431 Effusion, right wrist: Secondary | ICD-10-CM | POA: Diagnosis not present

## 2019-07-21 ENCOUNTER — Other Ambulatory Visit: Payer: Self-pay | Admitting: Orthopedic Surgery

## 2019-07-21 ENCOUNTER — Ambulatory Visit: Payer: 59

## 2019-07-21 DIAGNOSIS — M79601 Pain in right arm: Secondary | ICD-10-CM

## 2019-07-26 DIAGNOSIS — M79601 Pain in right arm: Secondary | ICD-10-CM | POA: Diagnosis not present

## 2019-08-05 DIAGNOSIS — M25431 Effusion, right wrist: Secondary | ICD-10-CM | POA: Diagnosis not present

## 2019-08-20 DIAGNOSIS — D2262 Melanocytic nevi of left upper limb, including shoulder: Secondary | ICD-10-CM | POA: Diagnosis not present

## 2019-08-20 DIAGNOSIS — D1722 Benign lipomatous neoplasm of skin and subcutaneous tissue of left arm: Secondary | ICD-10-CM | POA: Diagnosis not present

## 2019-08-20 DIAGNOSIS — D1721 Benign lipomatous neoplasm of skin and subcutaneous tissue of right arm: Secondary | ICD-10-CM | POA: Diagnosis not present

## 2019-08-20 DIAGNOSIS — L821 Other seborrheic keratosis: Secondary | ICD-10-CM | POA: Diagnosis not present

## 2019-08-20 DIAGNOSIS — D225 Melanocytic nevi of trunk: Secondary | ICD-10-CM | POA: Diagnosis not present

## 2019-08-20 DIAGNOSIS — D2261 Melanocytic nevi of right upper limb, including shoulder: Secondary | ICD-10-CM | POA: Diagnosis not present

## 2019-08-20 DIAGNOSIS — D2272 Melanocytic nevi of left lower limb, including hip: Secondary | ICD-10-CM | POA: Diagnosis not present

## 2019-08-20 DIAGNOSIS — C44612 Basal cell carcinoma of skin of right upper limb, including shoulder: Secondary | ICD-10-CM | POA: Diagnosis not present

## 2019-08-20 DIAGNOSIS — D2271 Melanocytic nevi of right lower limb, including hip: Secondary | ICD-10-CM | POA: Diagnosis not present

## 2019-08-20 DIAGNOSIS — D485 Neoplasm of uncertain behavior of skin: Secondary | ICD-10-CM | POA: Diagnosis not present

## 2019-09-07 ENCOUNTER — Other Ambulatory Visit: Payer: Self-pay | Admitting: Physician Assistant

## 2019-09-07 DIAGNOSIS — Z1231 Encounter for screening mammogram for malignant neoplasm of breast: Secondary | ICD-10-CM

## 2019-10-08 ENCOUNTER — Ambulatory Visit
Admission: RE | Admit: 2019-10-08 | Discharge: 2019-10-08 | Disposition: A | Discharge status: Home / Self Care | Payer: 59 | Source: Ambulatory Visit | Attending: Physician Assistant | Admitting: Physician Assistant

## 2019-10-08 DIAGNOSIS — Z1231 Encounter for screening mammogram for malignant neoplasm of breast: Secondary | ICD-10-CM | POA: Diagnosis not present

## 2019-10-11 ENCOUNTER — Other Ambulatory Visit: Payer: Self-pay | Admitting: Physician Assistant

## 2019-10-11 ENCOUNTER — Encounter: Payer: Self-pay | Admitting: Physician Assistant

## 2019-10-11 DIAGNOSIS — N6320 Unspecified lump in the left breast, unspecified quadrant: Secondary | ICD-10-CM

## 2019-10-11 DIAGNOSIS — R928 Other abnormal and inconclusive findings on diagnostic imaging of breast: Secondary | ICD-10-CM

## 2019-10-15 DIAGNOSIS — L820 Inflamed seborrheic keratosis: Secondary | ICD-10-CM | POA: Diagnosis not present

## 2019-10-15 DIAGNOSIS — L538 Other specified erythematous conditions: Secondary | ICD-10-CM | POA: Diagnosis not present

## 2019-10-15 DIAGNOSIS — C44612 Basal cell carcinoma of skin of right upper limb, including shoulder: Secondary | ICD-10-CM | POA: Diagnosis not present

## 2019-10-22 ENCOUNTER — Ambulatory Visit
Admission: RE | Admit: 2019-10-22 | Discharge: 2019-10-22 | Disposition: A | Discharge status: Home / Self Care | Payer: 59 | Source: Ambulatory Visit | Attending: Physician Assistant | Admitting: Physician Assistant

## 2019-10-22 DIAGNOSIS — N6320 Unspecified lump in the left breast, unspecified quadrant: Secondary | ICD-10-CM

## 2019-10-22 DIAGNOSIS — N6012 Diffuse cystic mastopathy of left breast: Secondary | ICD-10-CM | POA: Diagnosis not present

## 2019-10-22 DIAGNOSIS — R928 Other abnormal and inconclusive findings on diagnostic imaging of breast: Secondary | ICD-10-CM

## 2019-10-22 DIAGNOSIS — R922 Inconclusive mammogram: Secondary | ICD-10-CM | POA: Diagnosis not present

## 2019-10-29 ENCOUNTER — Encounter: Payer: 59 | Admitting: Physician Assistant

## 2019-11-08 ENCOUNTER — Other Ambulatory Visit: Payer: Self-pay

## 2019-11-08 ENCOUNTER — Encounter: Payer: Self-pay | Admitting: Physician Assistant

## 2019-11-08 ENCOUNTER — Other Ambulatory Visit (HOSPITAL_COMMUNITY)
Admission: RE | Admit: 2019-11-08 | Discharge: 2019-11-08 | Disposition: A | Discharge status: Home / Self Care | Payer: 59 | Source: Ambulatory Visit | Attending: Physician Assistant | Admitting: Physician Assistant

## 2019-11-08 ENCOUNTER — Ambulatory Visit (INDEPENDENT_AMBULATORY_CARE_PROVIDER_SITE_OTHER): Payer: 59 | Admitting: Physician Assistant

## 2019-11-08 VITALS — BP 132/90 | HR 109 | Temp 97.7°F | Resp 16 | Ht 62.0 in | Wt 193.4 lb

## 2019-11-08 DIAGNOSIS — Z1211 Encounter for screening for malignant neoplasm of colon: Secondary | ICD-10-CM | POA: Diagnosis not present

## 2019-11-08 DIAGNOSIS — I10 Essential (primary) hypertension: Secondary | ICD-10-CM

## 2019-11-08 DIAGNOSIS — Z0000 Encounter for general adult medical examination without abnormal findings: Secondary | ICD-10-CM

## 2019-11-08 DIAGNOSIS — R635 Abnormal weight gain: Secondary | ICD-10-CM

## 2019-11-08 DIAGNOSIS — Z6835 Body mass index (BMI) 35.0-35.9, adult: Secondary | ICD-10-CM

## 2019-11-08 DIAGNOSIS — E559 Vitamin D deficiency, unspecified: Secondary | ICD-10-CM | POA: Diagnosis not present

## 2019-11-08 DIAGNOSIS — Z124 Encounter for screening for malignant neoplasm of cervix: Secondary | ICD-10-CM

## 2019-11-08 NOTE — Patient Instructions (Signed)
Health Maintenance, Female Adopting a healthy lifestyle and getting preventive care are important in promoting health and wellness. Ask your health care provider about:  The right schedule for you to have regular tests and exams.  Things you can do on your own to prevent diseases and keep yourself healthy. What should I know about diet, weight, and exercise? Eat a healthy diet   Eat a diet that includes plenty of vegetables, fruits, low-fat dairy products, and lean protein.  Do not eat a lot of foods that are high in solid fats, added sugars, or sodium. Maintain a healthy weight Body mass index (BMI) is used to identify weight problems. It estimates body fat based on height and weight. Your health care provider can help determine your BMI and help you achieve or maintain a healthy weight. Get regular exercise Get regular exercise. This is one of the most important things you can do for your health. Most adults should:  Exercise for at least 150 minutes each week. The exercise should increase your heart rate and make you sweat (moderate-intensity exercise).  Do strengthening exercises at least twice a week. This is in addition to the moderate-intensity exercise.  Spend less time sitting. Even light physical activity can be beneficial. Watch cholesterol and blood lipids Have your blood tested for lipids and cholesterol at 50 years of age, then have this test every 5 years. Have your cholesterol levels checked more often if:  Your lipid or cholesterol levels are high.  You are older than 50 years of age.  You are at high risk for heart disease. What should I know about cancer screening? Depending on your health history and family history, you may need to have cancer screening at various ages. This may include screening for:  Breast cancer.  Cervical cancer.  Colorectal cancer.  Skin cancer.  Lung cancer. What should I know about heart disease, diabetes, and high blood  pressure? Blood pressure and heart disease  High blood pressure causes heart disease and increases the risk of stroke. This is more likely to develop in people who have high blood pressure readings, are of African descent, or are overweight.  Have your blood pressure checked: ? Every 3-5 years if you are 18-39 years of age. ? Every year if you are 40 years old or older. Diabetes Have regular diabetes screenings. This checks your fasting blood sugar level. Have the screening done:  Once every three years after age 40 if you are at a normal weight and have a low risk for diabetes.  More often and at a younger age if you are overweight or have a high risk for diabetes. What should I know about preventing infection? Hepatitis B If you have a higher risk for hepatitis B, you should be screened for this virus. Talk with your health care provider to find out if you are at risk for hepatitis B infection. Hepatitis C Testing is recommended for:  Everyone born from 1945 through 1965.  Anyone with known risk factors for hepatitis C. Sexually transmitted infections (STIs)  Get screened for STIs, including gonorrhea and chlamydia, if: ? You are sexually active and are younger than 50 years of age. ? You are older than 50 years of age and your health care provider tells you that you are at risk for this type of infection. ? Your sexual activity has changed since you were last screened, and you are at increased risk for chlamydia or gonorrhea. Ask your health care provider if   you are at risk.  Ask your health care provider about whether you are at high risk for HIV. Your health care provider may recommend a prescription medicine to help prevent HIV infection. If you choose to take medicine to prevent HIV, you should first get tested for HIV. You should then be tested every 3 months for as long as you are taking the medicine. Pregnancy  If you are about to stop having your period (premenopausal) and  you may become pregnant, seek counseling before you get pregnant.  Take 400 to 800 micrograms (mcg) of folic acid every day if you become pregnant.  Ask for birth control (contraception) if you want to prevent pregnancy. Osteoporosis and menopause Osteoporosis is a disease in which the bones lose minerals and strength with aging. This can result in bone fractures. If you are 65 years old or older, or if you are at risk for osteoporosis and fractures, ask your health care provider if you should:  Be screened for bone loss.  Take a calcium or vitamin D supplement to lower your risk of fractures.  Be given hormone replacement therapy (HRT) to treat symptoms of menopause. Follow these instructions at home: Lifestyle  Do not use any products that contain nicotine or tobacco, such as cigarettes, e-cigarettes, and chewing tobacco. If you need help quitting, ask your health care provider.  Do not use street drugs.  Do not share needles.  Ask your health care provider for help if you need support or information about quitting drugs. Alcohol use  Do not drink alcohol if: ? Your health care provider tells you not to drink. ? You are pregnant, may be pregnant, or are planning to become pregnant.  If you drink alcohol: ? Limit how much you use to 0-1 drink a day. ? Limit intake if you are breastfeeding.  Be aware of how much alcohol is in your drink. In the [place] one drink equals one 12 oz bottle of beer (355 mL), one 5 oz glass of wine (148 mL), or one 1 oz glass of hard liquor (44 mL). General instructions  Schedule regular health, dental, and eye exams.  Stay current with your vaccines.  Tell your health care provider if: ? You often feel depressed. ? You have ever been abused or do not feel safe at home. Summary  Adopting a healthy lifestyle and getting preventive care are important in promoting health and wellness.  Follow your health care provider's instructions about healthy  diet, exercising, and getting tested or screened for diseases.  Follow your health care provider's instructions on monitoring your cholesterol and blood pressure. This information is not intended to replace advice given to you by your health care provider. Make sure you discuss any questions you have with your health care provider. Document Revised: 07/08/2018 Document Reviewed: 07/08/2018 Elsevier Patient Education  2020 Elsevier Inc.  

## 2019-11-08 NOTE — Progress Notes (Signed)
Complete physical exam      Patient: Audrey Koch   DOB: 1971-02-26   49 y.o. Female  MRN: [id number] Visit Date: 11/08/2019  Today's healthcare provider: Mar Daring, PA-C  Subjective:    Chief Complaint  Patient presents with  . Annual Exam    Audrey Koch is a 49 y.o. female who presents today for a complete physical exam. HPI  Audrey Koch reports she is sleeping ok. She does exercise, but not regularly. She eats a fairly healthy diet. No acute complaints today.  Past Medical History:  Diagnosis Date  . Hyperlipidemia   . Hypertension   . Migraine    Past Surgical History:  Procedure Laterality Date  . HEMORROIDECTOMY  10/08/13  . NO PAST SURGERIES     Social History   Socioeconomic History  . Marital status: Married    Spouse name: Not on file  . Number of children: Not on file  . Years of education: Not on file  . Highest education level: Not on file  Occupational History  . Not on file  Tobacco Use  . Smoking status: Never Smoker  . Smokeless tobacco: Never Used  Substance and Sexual Activity  . Alcohol use: No  . Drug use: No  . Sexual activity: Not on file  Other Topics Concern  . Not on file  Social History Narrative  . Not on file   Social Determinants of Health   Financial Resource Strain:   . Difficulty of Paying Living Expenses:   Food Insecurity:   . Worried About Charity fundraiser in the Last Year:   . Arboriculturist in the Last Year:   Transportation Needs:   . Film/video editor (Medical):   Marland Kitchen Lack of Transportation (Non-Medical):   Physical Activity:   . Days of Exercise per Week:   . Minutes of Exercise per Session:   Stress:   . Feeling of Stress :   Social Connections:   . Frequency of Communication with Friends and Family:   . Frequency of Social Gatherings with Friends and Family:   . Attends Religious Services:   . Active Member of Clubs or Organizations:   . Attends Archivist Meetings:   Marland Kitchen Marital  Status:   Intimate Partner Violence:   . Fear of Current or Ex-Partner:   . Emotionally Abused:   Marland Kitchen Physically Abused:   . Sexually Abused:    Family Status  Relation Name Status  . Mother  Alive  . Father  Deceased  . Brother 1 Alive  . MGM  Alive  . MGF  Alive  . PGM  Alive  . PGF  Alive  . Brother 2 Alive  . Neg Hx  (Not Specified)   Family History  Problem Relation Age of Onset  . Heart attack Mother   . Hyperlipidemia Mother   . Dementia Maternal Grandmother   . Diabetes Maternal Grandfather   . Dementia Paternal Grandmother   . Diabetes Paternal Grandfather   . Esophageal cancer Paternal Grandfather   . Diabetes Brother   . Breast cancer Neg Hx    Allergies  Allergen Reactions  . Sulfa Antibiotics Rash    Patient Care Team: Mar Daring, PA-C as PCP - General (Family Medicine) Margarita Rana, MD as Referring Physician (Family Medicine) Christene Lye, MD (General Surgery)   Medications: Outpatient Medications Prior to Visit  Medication Sig  . ibuprofen (ADVIL,MOTRIN) 600 MG tablet Take  1 tablet (600 mg total) by mouth every 6 (six) hours as needed.  . metoprolol succinate (TOPROL-XL) 25 MG 24 hr tablet TAKE 1 TABLET BY MOUTH DAILY  . Multiple Vitamins-Minerals (VITAMIN D3 COMPLETE PO) Take by mouth.  . spironolactone (ALDACTONE) 25 MG tablet 50 mg.   . triamterene-hydrochlorothiazide (MAXZIDE-25) 37.5-25 MG tablet TAKE 1 TABLET BY MOUTH DAILY. (Patient not taking: Reported on 05/28/2019)   No facility-administered medications prior to visit.    Review of Systems  Constitutional: Negative.   HENT: Negative.   Eyes: Negative.   Respiratory: Negative.   Cardiovascular: Negative.   Gastrointestinal: Negative.   Endocrine: Negative.   Genitourinary: Negative.   Musculoskeletal: Positive for back pain.  Skin: Negative.   Allergic/Immunologic: Negative.   Neurological: Positive for headaches.  Hematological: Negative.    Psychiatric/Behavioral: Negative.         Objective:    BP 132/90 (BP Location: Left Arm, Patient Position: Sitting, Cuff Size: Large)   Pulse (!) 109   Temp 97.7 F (36.5 C) (Temporal)   Resp 16   Ht 5\' 2"  (1.575 m)   Wt 193 lb 6.4 oz (87.7 kg)   LMP 11/02/2019   BMI 35.37 kg/m    Physical Exam Vitals reviewed.  Constitutional:      General: She is not in acute distress.    Appearance: Normal appearance. She is well-developed. She is obese. She is not ill-appearing or diaphoretic.  HENT:     Head: Normocephalic and atraumatic.     Right Ear: Hearing, tympanic membrane, ear canal and external ear normal.     Left Ear: Hearing, tympanic membrane, ear canal and external ear normal.     Mouth/Throat:     Pharynx: Uvula midline.  Eyes:     General: No scleral icterus.       Right eye: No discharge.        Left eye: No discharge.     Extraocular Movements: Extraocular movements intact.     Conjunctiva/sclera: Conjunctivae normal.     Pupils: Pupils are equal, round, and reactive to light.  Neck:     Thyroid: No thyromegaly.     Vascular: No carotid bruit or JVD.     Trachea: No tracheal deviation.  Cardiovascular:     Rate and Rhythm: Normal rate and regular rhythm.     Pulses: Normal pulses.     Heart sounds: Normal heart sounds. No murmur. No friction rub. No gallop.   Pulmonary:     Effort: Pulmonary effort is normal. No respiratory distress.     Breath sounds: Normal breath sounds. No wheezing or rales.  Chest:     Chest wall: No tenderness.     Breasts: Breasts are symmetrical.        Right: No inverted nipple, mass, nipple discharge, skin change or tenderness.        Left: No inverted nipple, mass, nipple discharge, skin change or tenderness.  Abdominal:     General: Abdomen is flat. Bowel sounds are normal. There is no distension.     Palpations: Abdomen is soft. There is no mass.     Tenderness: There is no abdominal tenderness. There is no guarding or  rebound.     Hernia: There is no hernia in the left inguinal area.  Genitourinary:    Exam position: Supine.     Labia:        Right: No rash, tenderness, lesion or injury.  Left: No rash, tenderness, lesion or injury.      Vagina: Normal. No signs of injury. No vaginal discharge, erythema, tenderness or bleeding.     Cervix: No cervical motion tenderness, discharge or friability.     Adnexa:        Right: No mass, tenderness or fullness.         Left: No mass, tenderness or fullness.       Rectum: Normal.  Musculoskeletal:        General: No tenderness. Normal range of motion.     Cervical back: Normal range of motion and neck supple.     Right lower leg: No edema.     Left lower leg: No edema.  Lymphadenopathy:     Cervical: No cervical adenopathy.  Skin:    General: Skin is warm and dry.     Capillary Refill: Capillary refill takes less than 2 seconds.     Findings: No rash.  Neurological:     General: No focal deficit present.     Mental Status: She is alert and oriented to person, place, and time. Mental status is at baseline.     Cranial Nerves: No cranial nerve deficit.     Coordination: Coordination normal.     Deep Tendon Reflexes: Reflexes are normal and symmetric.  Psychiatric:        Mood and Affect: Mood normal.        Behavior: Behavior normal.        Thought Content: Thought content normal.        Judgment: Judgment normal.      Depression Screen  PHQ 2/9 Scores 11/08/2019 02/10/2015  PHQ - 2 Score 0 0  PHQ- 9 Score 1 -  Some encounter information is confidential and restricted. Go to Review Flowsheets activity to see all data.    No results found for any visits on 11/08/19.    Assessment & Plan:    Routine Health Maintenance and Physical Exam  Exercise Activities and Dietary recommendations Goals   None     Immunization History  Administered Date(s) Administered  . DTaP 02/12/1974, 03/11/1988, 10/31/1998  . Hepatitis B 05/16/1992,  06/30/1992, 11/17/1992  . Influenza Whole 05/06/2017    Health Maintenance  Topic Date Due  . HIV Screening  Never done  . TETANUS/TDAP  Never done  . COLONOSCOPY  Never done  . INFLUENZA VACCINE  02/27/2020  . PAP SMEAR-Modifier  06/12/2020  . MAMMOGRAM  10/07/2021    Discussed health benefits of physical activity, and encouraged her to engage in regular exercise appropriate for her age and condition.   1. Annual physical exam Normal physical exam today. Will check labs as below and f/u pending lab results. If labs are stable and WNL she will not need to have these rechecked for one year at her next annual physical exam. She is to call the office in the meantime if she has any acute issue, questions or concerns. - CBC with Differential/Platelet - Comprehensive metabolic panel - Hemoglobin A1c - TSH - Lipid panel  2. Colon cancer screening No family history. Patient prefers cologuard. Ordered as below.  - Cologuard  3. Cervical cancer screening Pap collected today. Will send as below and f/u pending results. - Cytology - PAP  4. Essential hypertension Stable. Continue Metoprolol XL 25mg  daily. Will check labs as below and f/u pending results. - CBC with Differential/Platelet - Comprehensive metabolic panel - Hemoglobin A1c - Lipid panel  5. Abnormal weight gain  Counseled patient on healthy lifestyle modifications including dieting and exercise. Will check labs as below and f/u pending results. - CBC with Differential/Platelet - Comprehensive metabolic panel - Hemoglobin A1c - Lipid panel - Insulin, Free and Total  6. Avitaminosis D H/O this. Will check labs as below and f/u pending results. - CBC with Differential/Platelet - Vitamin D (25 hydroxy)  7. Class 2 severe obesity due to excess calories with serious comorbidity and body mass index (BMI) of 35.0 to 35.9 in adult [facility]) Counseled patient on healthy lifestyle modifications including dieting and exercise.        Rubye Beach  [facility] [phone] (phone) [phone] (fax)  [place]

## 2019-11-10 DIAGNOSIS — E559 Vitamin D deficiency, unspecified: Secondary | ICD-10-CM | POA: Diagnosis not present

## 2019-11-10 DIAGNOSIS — R635 Abnormal weight gain: Secondary | ICD-10-CM | POA: Diagnosis not present

## 2019-11-10 DIAGNOSIS — Z0000 Encounter for general adult medical examination without abnormal findings: Secondary | ICD-10-CM | POA: Diagnosis not present

## 2019-11-10 DIAGNOSIS — I10 Essential (primary) hypertension: Secondary | ICD-10-CM | POA: Diagnosis not present

## 2019-11-11 ENCOUNTER — Telehealth: Payer: Self-pay

## 2019-11-11 LAB — CYTOLOGY - PAP
Comment: NEGATIVE
Diagnosis: NEGATIVE
High risk HPV: NEGATIVE

## 2019-11-11 NOTE — Telephone Encounter (Signed)
Blood count is normal. Kidney and liver function are normal. Sodium, potassium and calcium are normal. Sugar/A1c is normal. Thyroid is normal. Cholesterol is borderline high, but improved from last check. Vit D is normal. Pap is normal, HPV negative. Will repeat in 5 years. Insulin levels are still pending.  Written by Mar Daring, PA-C on 11/11/2019 3:11 PM EDT Seen by patient Audrey Koch on 11/11/2019 4:08 PM EDT

## 2019-11-11 NOTE — Telephone Encounter (Signed)
-----   Message from Mar Daring, [place] sent at 11/11/2019  3:11 PM EDT ----- Blood count is normal. Kidney and liver function are normal. Sodium, potassium and calcium are normal. Sugar/A1c is normal. Thyroid is normal. Cholesterol is borderline high, but improved from last check. Vit D is normal. Pap is normal, HPV negative.  Will repeat in 5 years. Insulin levels are still pending.

## 2019-11-16 LAB — CBC WITH DIFFERENTIAL/PLATELET
Basophils Absolute: 0.1 10*3/uL (ref 0.0–0.2)
Basos: 1 %
EOS (ABSOLUTE): 0.2 10*3/uL (ref 0.0–0.4)
Eos: 2 %
Hematocrit: 39.9 % (ref 34.0–46.6)
Hemoglobin: 13.2 g/dL (ref 11.1–15.9)
Immature Grans (Abs): 0 10*3/uL (ref 0.0–0.1)
Immature Granulocytes: 0 %
Lymphocytes Absolute: 2.1 10*3/uL (ref 0.7–3.1)
Lymphs: 29 %
MCH: 28.6 pg (ref 26.6–33.0)
MCHC: 33.1 g/dL (ref 31.5–35.7)
MCV: 87 fL (ref 79–97)
Monocytes Absolute: 0.6 10*3/uL (ref 0.1–0.9)
Monocytes: 9 %
Neutrophils Absolute: 4.3 10*3/uL (ref 1.4–7.0)
Neutrophils: 59 %
Platelets: 331 10*3/uL (ref 150–450)
RBC: 4.61 x10E6/uL (ref 3.77–5.28)
RDW: 13.2 % (ref 11.7–15.4)
WBC: 7.2 10*3/uL (ref 3.4–10.8)

## 2019-11-16 LAB — COMPREHENSIVE METABOLIC PANEL
ALT: 18 IU/L (ref 0–32)
AST: 13 IU/L (ref 0–40)
Albumin/Globulin Ratio: 1.7 (ref 1.2–2.2)
Albumin: 4 g/dL (ref 3.8–4.8)
Alkaline Phosphatase: 85 IU/L (ref 39–117)
BUN/Creatinine Ratio: 14 (ref 9–23)
BUN: 13 mg/dL (ref 6–24)
Bilirubin Total: 0.4 mg/dL (ref 0.0–1.2)
CO2: 22 mmol/L (ref 20–29)
Calcium: 8.8 mg/dL (ref 8.7–10.2)
Chloride: 103 mmol/L (ref 96–106)
Creatinine, Ser: 0.94 mg/dL (ref 0.57–1.00)
GFR calc Af Amer: 82 mL/min/{1.73_m2} (ref >59)
GFR calc non Af Amer: 71 mL/min/{1.73_m2} (ref >59)
Globulin, Total: 2.4 g/dL (ref 1.5–4.5)
Glucose: 97 mg/dL (ref 65–99)
Potassium: 4.2 mmol/L (ref 3.5–5.2)
Sodium: 138 mmol/L (ref 134–144)
Total Protein: 6.4 g/dL (ref 6.0–8.5)

## 2019-11-16 LAB — INSULIN, FREE AND TOTAL
Free Insulin: 19 uU/mL — ABNORMAL HIGH
Total Insulin: 19 uU/mL

## 2019-11-16 LAB — HEMOGLOBIN A1C
Est. average glucose Bld gHb Est-mCnc: 111 mg/dL
Hgb A1c MFr Bld: 5.5 % (ref 4.8–5.6)

## 2019-11-16 LAB — LIPID PANEL
Chol/HDL Ratio: 4.4 ratio (ref 0.0–4.4)
Cholesterol, Total: 205 mg/dL — ABNORMAL HIGH (ref 100–199)
HDL: 47 mg/dL (ref >39)
LDL Chol Calc (NIH): 143 mg/dL — ABNORMAL HIGH (ref 0–99)
Triglycerides: 84 mg/dL (ref 0–149)
VLDL Cholesterol Cal: 15 mg/dL (ref 5–40)

## 2019-11-16 LAB — VITAMIN D 25 HYDROXY (VIT D DEFICIENCY, FRACTURES): Vit D, 25-Hydroxy: 46.3 ng/mL (ref 30.0–100.0)

## 2019-11-16 LAB — TSH: TSH: 3.4 u[IU]/mL (ref 0.450–4.500)

## 2019-11-17 ENCOUNTER — Telehealth: Payer: Self-pay

## 2019-11-17 NOTE — Telephone Encounter (Signed)
   Comments to Patient Seen by patient Audrey Koch on 11/17/2019 11:53 AM EDT

## 2019-11-17 NOTE — Telephone Encounter (Signed)
-----   Message from Mar Daring, PA-C sent at 11/17/2019 11:39 AM EDT ----- Insulin level is borderline high at 19. High normal is 17. With considering insulin resistance with weight loss, we could always consider a low dose metformin 500mg  once daily to help. We could also consider Saxenda injection (a weight loss medication that is one daily injection that also helps with preventing diabetes). Let me know your thoughts.

## 2019-11-23 DIAGNOSIS — Z5181 Encounter for therapeutic drug level monitoring: Secondary | ICD-10-CM | POA: Diagnosis not present

## 2019-11-23 DIAGNOSIS — L709 Acne, unspecified: Secondary | ICD-10-CM | POA: Diagnosis not present

## 2019-11-23 DIAGNOSIS — Z833 Family history of diabetes mellitus: Secondary | ICD-10-CM | POA: Diagnosis not present

## 2019-11-23 DIAGNOSIS — R7301 Impaired fasting glucose: Secondary | ICD-10-CM | POA: Diagnosis not present

## 2019-11-23 DIAGNOSIS — I10 Essential (primary) hypertension: Secondary | ICD-10-CM | POA: Diagnosis not present

## 2019-11-23 DIAGNOSIS — L680 Hirsutism: Secondary | ICD-10-CM | POA: Diagnosis not present

## 2019-11-23 DIAGNOSIS — L659 Nonscarring hair loss, unspecified: Secondary | ICD-10-CM | POA: Diagnosis not present

## 2019-11-23 DIAGNOSIS — E278 Other specified disorders of adrenal gland: Secondary | ICD-10-CM | POA: Diagnosis not present

## 2019-11-23 DIAGNOSIS — E669 Obesity, unspecified: Secondary | ICD-10-CM | POA: Diagnosis not present

## 2019-12-02 ENCOUNTER — Ambulatory Visit (INDEPENDENT_AMBULATORY_CARE_PROVIDER_SITE_OTHER): Payer: 59 | Admitting: Nurse Practitioner

## 2019-12-17 DIAGNOSIS — L680 Hirsutism: Secondary | ICD-10-CM | POA: Diagnosis not present

## 2020-02-03 ENCOUNTER — Other Ambulatory Visit: Payer: Self-pay

## 2020-02-03 ENCOUNTER — Ambulatory Visit (INDEPENDENT_AMBULATORY_CARE_PROVIDER_SITE_OTHER): Payer: 59 | Admitting: Physician Assistant

## 2020-02-03 VITALS — BP 132/82 | HR 96 | Temp 97.9°F | Ht 62.5 in | Wt 194.0 lb

## 2020-02-03 DIAGNOSIS — R002 Palpitations: Secondary | ICD-10-CM

## 2020-02-03 DIAGNOSIS — I10 Essential (primary) hypertension: Secondary | ICD-10-CM

## 2020-02-03 NOTE — Progress Notes (Signed)
     Established patient visit   Patient: Audrey Koch   DOB: 1970/12/02   49 y.o. Female  MRN: [id number] Visit Date: 02/03/2020  Today's healthcare provider: Trinna Post, PA-C   Chief Complaint  Patient presents with  . elevated heartrate   Subjective    HPI  Patient presents today with an elevated HR. She reports that her resting HR averages in the 100s-120s. She describes it as a "flutter". She is also monitoring her BP and it has averaged in the 110s/70s. She denies dizziness or feeling lightheaded. She denies chest pain, syncope, or shortness of breath.  She has been taking spironolactone 50 mg as anti-androgen with endocrinologist Dr. Buddy Duty. She was at one point at 100 mg daily but has since decreased to 50 mg daily.   BP Readings from Last 3 Encounters:  02/03/20 132/82  11/08/19 132/90  06/18/19 106/80   No data found.      Medications: Outpatient Medications Prior to Visit  Medication Sig  . ibuprofen (ADVIL,MOTRIN) 600 MG tablet Take 1 tablet (600 mg total) by mouth every 6 (six) hours as needed.  . metoprolol succinate (TOPROL-XL) 25 MG 24 hr tablet TAKE 1 TABLET BY MOUTH DAILY  . Multiple Vitamins-Minerals (VITAMIN D3 COMPLETE PO) Take by mouth.  . spironolactone (ALDACTONE) 25 MG tablet 50 mg.    No facility-administered medications prior to visit.    Review of Systems  Constitutional: Negative for activity change and fatigue.  Cardiovascular: Positive for palpitations. Negative for chest pain and leg swelling.  Musculoskeletal: Negative.   Neurological: Negative for dizziness, tremors, syncope, weakness and light-headedness.  Psychiatric/Behavioral: Negative.       Objective    BP 132/82 (BP Location: Right Arm)   Pulse 96   Temp 97.9 F (36.6 C)   Ht 5' 2.5" (1.588 m)   Wt 194 lb (88 kg)   BMI 34.92 kg/m    Physical Exam Constitutional:      Appearance: Normal appearance.  Cardiovascular:     Rate and Rhythm: Normal rate and regular  rhythm.     Pulses: Normal pulses.     Heart sounds: Normal heart sounds.  Pulmonary:     Effort: Pulmonary effort is normal.     Breath sounds: Normal breath sounds.  Skin:    General: Skin is warm and dry.  Neurological:     Mental Status: She is alert and oriented to person, place, and time. Mental status is at baseline.  Psychiatric:        Mood and Affect: Mood normal.        Behavior: Behavior normal.      No results found for any visits on 02/03/20.  Assessment & Plan    1. Fluttering sensation of heart Hold spironolactone for 1 week to see if symptoms improve. If symptoms persist, may need cardiology referral. Orthostatic vital signs are normal today.   - EKG 12-Lead  2. Essential hypertension  See above.    Return if symptoms worsen or fail to improve.      ITrinna Post, PA-C, have reviewed all documentation for this visit. The documentation on 02/08/20 for the exam, diagnosis, procedures, and orders are all accurate and complete.    Paulene Floor  [facility] [phone] (phone) [phone] (fax)  [place]

## 2020-02-14 ENCOUNTER — Other Ambulatory Visit: Payer: Self-pay

## 2020-02-14 ENCOUNTER — Encounter: Payer: 59 | Attending: Physician Assistant | Admitting: Dietician

## 2020-02-14 VITALS — Ht 62.0 in | Wt 193.7 lb

## 2020-02-14 DIAGNOSIS — Z6835 Body mass index (BMI) 35.0-35.9, adult: Secondary | ICD-10-CM

## 2020-02-14 DIAGNOSIS — E669 Obesity, unspecified: Secondary | ICD-10-CM

## 2020-02-14 NOTE — Progress Notes (Signed)
[employer] Employee "self referral" nutrition session: Start time: 1645   End time: 1745  Height: 5'2" Weight: 192 lbs  Met with employee to discuss her nutritional concerns and diet history.   Diet history:  Pt states she is working with MD for ketogenic diet   20 g Carbs/day, moderate level of fat   Physical activity- ADLs, pt reports high heart rate and BP allow with shin pain makes exercise difficult currently   Pt states she follows an time restricted intermittent fasting diet as well   Typical eating pattern: 2 meals and 1-2 snacks Breakfast: skips Snack: SF jello, dried meat, pepperoni slices   Lunch (25OI): roast beef and green beans with cheese wisps Supper: grilled chicken salad, steak/chicken/fish with green vegetables Snack: too good yogurt with keto-friendly granola  Beverages: unsweet tea with stevia, 48-60oz water, diet coke    Education topics covered during this visit:  General nutrition/ Healthy eating  Exercise  Prediabetes  Weight Concerns  Other Medical Conditions: gut health  Educational resources provided:  10 Healthy habit changes   Additional Comments: Pt is dedicated to following ketogenic diet with intermittent fasting as well.  Discussed benefits of eating a wide array of foods.  Plan:  Outline dishes at regular takeout restaurants that work with diet plan to relieve stress of ordering out  Drink more water, at least 8 cups of water daily

## 2020-02-29 ENCOUNTER — Encounter: Payer: 59 | Attending: Physician Assistant | Admitting: Dietician

## 2020-02-29 ENCOUNTER — Other Ambulatory Visit: Payer: Self-pay

## 2020-02-29 DIAGNOSIS — E669 Obesity, unspecified: Secondary | ICD-10-CM

## 2020-02-29 NOTE — Progress Notes (Signed)
[employer] Employee "self referral" nutrition session: Start time: 1700   End time: 1730  Height: 5'2" Weight: 193.3 lbs  Met with employee to discuss her nutritional concerns and diet history.   Diet history:   Pt not engaging in regular physical activity due to heart rate high at rest or with light exertion   Pt states ketogenic/low carb diet going well; no changes in diet since last visit    Pt states eliminating carbs helps with cravings and when eating carbs cravings are harder to manage and moderation is challenging  Pt shared she has seen several different specialists and tried many different diets to improve health and weight management  Pt states she does not like most fermented foods- tempeh, kimchi, kombucha, kefir, yogurt, and sauerkraut   Education topics covered during this visit:  General nutrition/ Healthy eating  Exercise  Other Medical Conditions: gut health  Educational resources provided:  Mindful eating techniques   The Inside Tract by Jarrett Soho, MD and Legrand Como, MS, RD   Additional Comments: Discussed bringing up probiotic use with doctor to determine if this may be a helpful addition to her regime.  If so, discuss probiotic recommendations at follow up.   Plan: Recommend trying some of the techniques in 'The Inside Tract'.  May experience some relief and improvements with quality of life by focusing on gut health.  Discuss probiotic use with doctor.

## 2020-03-21 ENCOUNTER — Ambulatory Visit: Payer: 59 | Admitting: Dietician

## 2020-03-23 ENCOUNTER — Encounter: Payer: 59 | Admitting: Dietician

## 2020-03-23 ENCOUNTER — Other Ambulatory Visit: Payer: Self-pay

## 2020-03-23 VITALS — Ht 62.0 in | Wt 196.7 lb

## 2020-03-23 DIAGNOSIS — E669 Obesity, unspecified: Secondary | ICD-10-CM

## 2020-03-23 DIAGNOSIS — E66812 Obesity, class 2: Secondary | ICD-10-CM

## 2020-03-23 NOTE — Progress Notes (Signed)
[place] Employee "self referral" nutrition session: Start time: 445p   End time: 545p  Height: 5'2" Weight: 196.7lbs  Met with employee to discuss her nutritional concerns and diet history.   Diet history:  Pt reports trying to incorporate more carbs since last visit.  Pt reports feeling more hungry and having more sugar cravings when increasing carb intake. Pt states she feels frustrated by not being able to pinpoint what to eat to help with weight loss.    Typical eating pattern: Breakfast: whole wheat toast with butter Lunch: low carb tortilla with grilled chicken, lettuce, ranch dressing; salad (lettuce, apple, ranch) Supper:  chicken/steak filet/turkey/eggs with salad/green beans/broccoli/squash and sweet potato/1/2 cup brown rice  Beverages: coke zero, water    Education topics covered during this visit:  General nutrition/ Healthy eating  Exercise  Weight Concerns  Additional Comments: Pt diet intake not high in excess calories.  Fat and carb intake seem moderate and protein sources are lean.  Discussed that hormonal fluctuations could be impacting weight loss goals.  Reviewed importance of quality sleep, stress management, regular exercise, and gut health to help with hormonal balance.  Also discussed importance of consistency because noticing changes may take several months.     Plan:  Continue to eat at least three meals a day   Try to incorporate light exercise into your routine, like walking  Look into reading the gut health book recommendation

## 2020-04-13 DIAGNOSIS — L680 Hirsutism: Secondary | ICD-10-CM | POA: Diagnosis not present

## 2020-04-13 DIAGNOSIS — E278 Other specified disorders of adrenal gland: Secondary | ICD-10-CM | POA: Diagnosis not present

## 2020-04-13 DIAGNOSIS — E669 Obesity, unspecified: Secondary | ICD-10-CM | POA: Diagnosis not present

## 2020-04-13 DIAGNOSIS — I10 Essential (primary) hypertension: Secondary | ICD-10-CM | POA: Diagnosis not present

## 2020-04-17 DIAGNOSIS — D2262 Melanocytic nevi of left upper limb, including shoulder: Secondary | ICD-10-CM | POA: Diagnosis not present

## 2020-04-17 DIAGNOSIS — L821 Other seborrheic keratosis: Secondary | ICD-10-CM | POA: Diagnosis not present

## 2020-04-17 DIAGNOSIS — D225 Melanocytic nevi of trunk: Secondary | ICD-10-CM | POA: Diagnosis not present

## 2020-04-17 DIAGNOSIS — L814 Other melanin hyperpigmentation: Secondary | ICD-10-CM | POA: Diagnosis not present

## 2020-04-17 DIAGNOSIS — D2271 Melanocytic nevi of right lower limb, including hip: Secondary | ICD-10-CM | POA: Diagnosis not present

## 2020-04-17 DIAGNOSIS — D2261 Melanocytic nevi of right upper limb, including shoulder: Secondary | ICD-10-CM | POA: Diagnosis not present

## 2020-04-17 DIAGNOSIS — D2272 Melanocytic nevi of left lower limb, including hip: Secondary | ICD-10-CM | POA: Diagnosis not present

## 2020-05-03 ENCOUNTER — Other Ambulatory Visit: Payer: Self-pay | Admitting: Physician Assistant

## 2020-05-03 DIAGNOSIS — I10 Essential (primary) hypertension: Secondary | ICD-10-CM

## 2020-06-28 ENCOUNTER — Other Ambulatory Visit: Payer: Self-pay | Admitting: Physician Assistant

## 2020-06-28 ENCOUNTER — Encounter: Payer: Self-pay | Admitting: Physician Assistant

## 2020-06-28 DIAGNOSIS — R0683 Snoring: Secondary | ICD-10-CM

## 2020-06-28 DIAGNOSIS — R400 Somnolence: Secondary | ICD-10-CM

## 2020-06-28 DIAGNOSIS — K649 Unspecified hemorrhoids: Secondary | ICD-10-CM

## 2020-06-28 MED ORDER — HYDROCORTISONE ACETATE 25 MG RE SUPP: 25.0000 mg | Freq: Two times a day (BID) | RECTAL | 0 refills | Status: DC

## 2020-07-14 ENCOUNTER — Other Ambulatory Visit: Payer: Self-pay | Admitting: Physician Assistant

## 2020-07-14 MED ORDER — LIDOCAINE-HYDROCORTISONE ACE 3-1 % RE KIT: 1.0000 "application " | PACK | Freq: Two times a day (BID) | RECTAL | 0 refills | Status: DC

## 2020-07-14 NOTE — Addendum Note (Signed)
Addended by: Mar Daring on: 07/14/2020 04:43 PM   Modules accepted: Orders

## 2020-07-17 ENCOUNTER — Encounter: Payer: Self-pay | Admitting: Physician Assistant

## 2020-09-25 ENCOUNTER — Encounter: Payer: Self-pay | Admitting: Physician Assistant

## 2020-09-25 ENCOUNTER — Other Ambulatory Visit: Payer: Self-pay | Admitting: Physician Assistant

## 2020-09-25 DIAGNOSIS — Z1239 Encounter for other screening for malignant neoplasm of breast: Secondary | ICD-10-CM

## 2020-09-25 DIAGNOSIS — E8881 Metabolic syndrome: Secondary | ICD-10-CM

## 2020-09-25 DIAGNOSIS — Z833 Family history of diabetes mellitus: Secondary | ICD-10-CM

## 2020-09-25 MED ORDER — FREESTYLE LIBRE 14 DAY SENSOR MISC: 4 refills | Status: DC

## 2020-09-25 MED ORDER — FREESTYLE LIBRE 14 DAY READER DEVI: 0 refills | Status: DC

## 2020-09-27 ENCOUNTER — Other Ambulatory Visit: Payer: Self-pay | Admitting: Physician Assistant

## 2020-09-27 DIAGNOSIS — I10 Essential (primary) hypertension: Secondary | ICD-10-CM

## 2020-09-27 NOTE — Telephone Encounter (Signed)
No follow up on file, will refill per protocol.

## 2020-10-13 ENCOUNTER — Other Ambulatory Visit: Payer: Self-pay | Admitting: Physician Assistant

## 2020-10-13 MED ORDER — METFORMIN HCL 500 MG PO TABS: 500.0000 mg | ORAL_TABLET | Freq: Every day | ORAL | 1 refills | Status: DC

## 2020-10-13 NOTE — Addendum Note (Signed)
Addended by: Mar Daring on: 10/13/2020 08:20 AM   Modules accepted: Orders

## 2020-11-09 ENCOUNTER — Encounter: Payer: Self-pay | Admitting: Adult Health

## 2020-11-09 NOTE — Progress Notes (Signed)
November 09, 2020 To Whom It May Concern:  It is my medical opinion that Audrey Koch your colo guard order has expired. If you would like to still have this recommended screening for colon cancer please call or message the office if you would like to have another order placed.   If you have any questions or concerns, please don't hesitate to call.  Sincerely, Doreen Beam, FNP  Letter sent in [place] see letter

## 2020-12-18 ENCOUNTER — Telehealth: Payer: Self-pay

## 2020-12-18 DIAGNOSIS — U071 COVID-19: Secondary | ICD-10-CM

## 2020-12-18 NOTE — Telephone Encounter (Signed)
Ok to place referral for amb ref Covid to send her to antibody infusion clinic (still open through next week)

## 2020-12-18 NOTE — Telephone Encounter (Signed)
Please advise. Patient reports head ache, congestion, chills, and nausea.

## 2020-12-18 NOTE — Telephone Encounter (Signed)
Copied from [place] [id number]. Topic: General - Other >> Dec 18, 2020  8:28 AM Celene Kras wrote: Reason for CRM: Pt called stating that she tested positive for coivd on 12/17/20. She states that her symptoms started Friday and that she was interested in the covid infusion clinic and is needing to be seen today. Please advise.

## 2020-12-19 ENCOUNTER — Telehealth: Payer: Self-pay | Admitting: Adult Health

## 2020-12-19 ENCOUNTER — Telehealth: Payer: Self-pay

## 2020-12-19 NOTE — Telephone Encounter (Signed)
Called to discuss with patient about COVID-19 symptoms and the use of one of the available treatments for those with mild to moderate Covid symptoms and at a high risk of hospitalization.  Pt appears to qualify for outpatient treatment due to co-morbid conditions and/or a member of an at-risk group in accordance with the FDA Emergency Use Authorization.    Symptom onset: 12/15/20 Headache,congestion,nausea Vaccinated: Yes Booster? No Immunocompromised? No Qualifiers: HTN.Obesity NIH Criteria: Tier 4  Pt. Would like to speak with APP.  Audrey Koch

## 2020-12-19 NOTE — Telephone Encounter (Signed)
Called to discuss with patient about COVID-19 symptoms and the use of one of the available treatments for those with mild to moderate Covid symptoms and at a high risk of hospitalization.  Pt appears to qualify for outpatient treatment due to co-morbid conditions and/or a member of an at-risk group in accordance with the FDA Emergency Use Authorization.    Symptom onset: 12/15/2020  I reviewed the oral treatment options with Audrey Koch which include Paxlovid and Molnupiravir.  I reviewed with her the risk and benefits.  Audrey Koch is starting to feel better today and declines therapy.  I recommended that she continue supportive measures and reach out to her PCP for any lingering issues.    Scot Dock

## 2020-12-19 NOTE — Telephone Encounter (Signed)
Noted  

## 2021-03-05 ENCOUNTER — Encounter: Payer: Self-pay | Admitting: Family Medicine

## 2021-03-06 ENCOUNTER — Other Ambulatory Visit: Payer: Self-pay

## 2021-03-06 DIAGNOSIS — Z1231 Encounter for screening mammogram for malignant neoplasm of breast: Secondary | ICD-10-CM

## 2021-03-09 ENCOUNTER — Ambulatory Visit (INDEPENDENT_AMBULATORY_CARE_PROVIDER_SITE_OTHER): Payer: 59 | Admitting: Family Medicine

## 2021-03-09 ENCOUNTER — Other Ambulatory Visit: Payer: Self-pay

## 2021-03-09 ENCOUNTER — Encounter: Payer: Self-pay | Admitting: Family Medicine

## 2021-03-09 VITALS — BP 130/83 | HR 72 | Resp 16 | Ht 62.0 in | Wt 195.0 lb

## 2021-03-09 DIAGNOSIS — K644 Residual hemorrhoidal skin tags: Secondary | ICD-10-CM | POA: Diagnosis not present

## 2021-03-09 DIAGNOSIS — E669 Obesity, unspecified: Secondary | ICD-10-CM | POA: Diagnosis not present

## 2021-03-09 DIAGNOSIS — I10 Essential (primary) hypertension: Secondary | ICD-10-CM

## 2021-03-09 MED ORDER — METOPROLOL SUCCINATE ER 25 MG PO TB24
ORAL_TABLET | Freq: Every day | ORAL | 1 refills | Status: DC
  Filled 2021-03-09: qty 90, 90d supply, fill #0
  Filled 2021-08-01: qty 90, 90d supply, fill #1

## 2021-03-09 MED ORDER — LIDOCAINE-HYDROCORTISONE ACE 2.8-0.55 % RE GEL
RECTAL | 0 refills | Status: DC
  Filled 2021-03-09: qty 100, 30d supply, fill #0

## 2021-03-09 NOTE — Assessment & Plan Note (Addendum)
Longstanding issue and worsening No thrombosis at this time, but significant irritation Can use lidocaine-hydrocortisone prn Tucks wipes, bidet consideration  Referral to GI

## 2021-03-09 NOTE — Assessment & Plan Note (Signed)
Discussed importance of healthy weight management Discussed diet and exercise  

## 2021-03-09 NOTE — Progress Notes (Signed)
Established patient visit   Patient: Audrey Koch   DOB: [birth year]   49 y.o. Female  MRN: [id number] Visit Date: 03/09/2021  Today's healthcare provider: Lavon Paganini, MD   Chief Complaint  Patient presents with   Hemorrhoids   Subjective    HPI   Hemorrhoids She has had hemorrhoids in the past and had to have surgery. She states now she has skin tags around anal area that is itchy and irritated. They have been aggravated for about 6 months. Also it is very hard for her to stay clean in that area.   She has tried creams, prescription for hemorrhoids and Tucks pads however nothing has really worked. She wants to discuss options for treatment. She is agreeable to referral for GI because it is becoming unbearable.   Screening Mammogram scheduled for he 08/24   Refills Requesting for a refill on 25 mg metoprolol.      Medications: Outpatient Medications Prior to Visit  Medication Sig   ibuprofen (ADVIL,MOTRIN) 600 MG tablet Take 1 tablet (600 mg total) by mouth every 6 (six) hours as needed.   metFORMIN (GLUCOPHAGE) 500 MG tablet TAKE 1 TABLET BY MOUTH DAILY WITH BREAKFAST.   Multiple Vitamins-Minerals (VITAMIN D3 COMPLETE PO) Take by mouth.   [DISCONTINUED] lidocaine-hydrocortisone (ANAMANTLE) 3-1 % KIT PLACE CONTENTS OF 1 TUBE RECTALLY TWO TIMES DAILY   [DISCONTINUED] metoprolol succinate (TOPROL-XL) 25 MG 24 hr tablet TAKE 1 TABLET BY MOUTH DAILY   [DISCONTINUED] Continuous Blood Gluc Receiver (FREESTYLE LIBRE 14 DAY READER) DEVI TO CHECK BLOOD SUGAR (Patient not taking: Reported on 03/09/2021)   [DISCONTINUED] Continuous Blood Gluc Sensor (FREESTYLE LIBRE 14 DAY SENSOR) MISC TO CHECK BLOOD SUGAR TO MONITOR FOR SPIKES IN BLOOD SUGAR; PREVENTION OF DIABETES; CHANGE SENSOR EVERY 14 DAYS (Patient not taking: Reported on 03/09/2021)   [DISCONTINUED] hydrocortisone (ANUSOL-HC) 25 MG suppository UNWRAP AND INSERT 1 SUPPOSITORY RECTALLY 2 TIMES DAILY. (Patient not taking:  Reported on 03/09/2021)   [DISCONTINUED] spironolactone (ALDACTONE) 25 MG tablet 50 mg.  (Patient not taking: Reported on 03/09/2021)   No facility-administered medications prior to visit.    Review of Systems  Constitutional:  Negative for appetite change, chills, fatigue and fever.  HENT:  Negative for ear pain, sinus pressure, sinus pain and sore throat.   Eyes:  Negative for pain.  Respiratory:  Negative for cough, chest tightness, shortness of breath and wheezing.   Cardiovascular:  Negative for chest pain, palpitations and leg swelling.  Gastrointestinal:  Negative for abdominal pain, blood in stool, diarrhea, nausea and vomiting.  Genitourinary:  Negative for flank pain, frequency, pelvic pain and urgency.  Musculoskeletal:  Negative for back pain, myalgias and neck pain.  Neurological:  Negative for dizziness, weakness, light-headedness, numbness and headaches.      Objective    BP 130/83 (BP Location: Right Arm, Patient Position: Sitting, Cuff Size: Large)   Pulse 72   Resp 16   Ht '5\' 2"'  (1.575 m)   Wt 195 lb (88.5 kg)   SpO2 95%   BMI 35.67 kg/m     Physical Exam Vitals reviewed.  Constitutional:      General: She is not in acute distress.    Appearance: Normal appearance. She is well-developed. She is not diaphoretic.  HENT:     Head: Normocephalic and atraumatic.  Eyes:     General: No scleral icterus.    Conjunctiva/sclera: Conjunctivae normal.  Neck:     Thyroid: No thyromegaly.  Cardiovascular:  Rate and Rhythm: Normal rate and regular rhythm.     Pulses: Normal pulses.     Heart sounds: Normal heart sounds. No murmur heard. Pulmonary:     Effort: Pulmonary effort is normal. No respiratory distress.     Breath sounds: Normal breath sounds. No wheezing, rhonchi or rales.  Genitourinary:    Rectum: External hemorrhoid present. No anal fissure.  Musculoskeletal:     Cervical back: Neck supple.     Right lower leg: No edema.     Left lower leg: No  edema.  Lymphadenopathy:     Cervical: No cervical adenopathy.  Skin:    General: Skin is warm and dry.     Findings: No rash.  Neurological:     Mental Status: She is alert and oriented to person, place, and time. Mental status is at baseline.  Psychiatric:        Mood and Affect: Mood normal.        Behavior: Behavior normal.     No results found for any visits on 03/09/21.  Assessment & Plan     Problem List Items Addressed This Visit       Cardiovascular and Mediastinum   External hemorrhoid - Primary    Longstanding issue and worsening No thrombosis at this time, but significant irritation Can use lidocaine-hydrocortisone prn Tucks wipes, bidet consideration  Referral to GI      Relevant Medications   metoprolol succinate (TOPROL-XL) 25 MG 24 hr tablet   Other Relevant Orders   Ambulatory referral to Gastroenterology   Essential hypertension    Well controlled Continue current medications      Relevant Medications   metoprolol succinate (TOPROL-XL) 25 MG 24 hr tablet     Other   Obesity (BMI 35.0-39.9 without comorbidity)    Discussed importance of healthy weight management Discussed diet and exercise         Return in about 4 months (around 07/09/2021) for CPE, as scheduled.      I,Essence Turner,acting as a Education administrator for Lavon Paganini, MD.,have documented all relevant documentation on the behalf of Lavon Paganini, MD,as directed by  Lavon Paganini, MD while in the presence of Lavon Paganini, MD.  I, Lavon Paganini, MD, have reviewed all documentation for this visit. The documentation on 03/09/21 for the exam, diagnosis, procedures, and orders are all accurate and complete.   Derryl Uher, Dionne Bucy, MD, MPH [facility]

## 2021-03-09 NOTE — Assessment & Plan Note (Signed)
Well controlled. Continue current medications  

## 2021-03-12 ENCOUNTER — Encounter: Payer: Self-pay | Admitting: *Deleted

## 2021-03-12 ENCOUNTER — Other Ambulatory Visit: Payer: Self-pay

## 2021-03-21 ENCOUNTER — Other Ambulatory Visit: Payer: Self-pay

## 2021-03-21 ENCOUNTER — Ambulatory Visit
Admission: RE | Admit: 2021-03-21 | Discharge: 2021-03-21 | Disposition: A | Discharge status: Home / Self Care | Payer: 59 | Source: Ambulatory Visit | Attending: Family Medicine | Admitting: Family Medicine

## 2021-03-21 DIAGNOSIS — Z1231 Encounter for screening mammogram for malignant neoplasm of breast: Secondary | ICD-10-CM | POA: Diagnosis not present

## 2021-04-10 ENCOUNTER — Encounter: Payer: Self-pay | Admitting: Family Medicine

## 2021-04-18 ENCOUNTER — Ambulatory Visit: Payer: 59 | Admitting: Family Medicine

## 2021-04-18 ENCOUNTER — Other Ambulatory Visit: Payer: Self-pay

## 2021-04-18 ENCOUNTER — Encounter: Payer: Self-pay | Admitting: Family Medicine

## 2021-04-18 VITALS — BP 124/82 | HR 85 | Resp 16 | Ht 62.0 in | Wt 200.0 lb

## 2021-04-18 DIAGNOSIS — R0683 Snoring: Secondary | ICD-10-CM | POA: Diagnosis not present

## 2021-04-18 DIAGNOSIS — E669 Obesity, unspecified: Secondary | ICD-10-CM

## 2021-04-18 NOTE — Progress Notes (Signed)
I,[name],acting as a scribe for Audrey Durie, MD.,have documented all relevant documentation on the behalf of Audrey Durie, MD,as directed by  Audrey Durie, MD while in the presence of Audrey Durie, MD.   Established patient visit   Patient: Audrey Koch   DOB: [birth year]   50 y.o. Female  MRN: [id number] Visit Date: 04/18/2021  Today's healthcare provider: [name], MD   Chief Complaint  Patient presents with   Sleeping Problem   Subjective    HPI  Patient presents today wanting to discuss possibly needing a sleep study. Patient has snoring. Patient is also very fatigued in the mornings. Patient had a sleep study done 2 years ago. Patient states she did not sleep during the study, therefore there was no results.  Patient states she snores nightly but no documented apnea spells.  She now has to sleep in separate room from her husband due to snoring.  She did a in lab sleep study before but was unable to sleep so there were no results.    Medications: Outpatient Medications Prior to Visit  Medication Sig   ibuprofen (ADVIL,MOTRIN) 600 MG tablet Take 1 tablet (600 mg total) by mouth every 6 (six) hours as needed.   Lidocaine-Hydrocortisone Ace 2.8-0.55 % GEL Use rectally BID prn for hemorrhoid irritation   metFORMIN (GLUCOPHAGE) 500 MG tablet TAKE 1 TABLET BY MOUTH DAILY WITH BREAKFAST.   metoprolol succinate (TOPROL-XL) 25 MG 24 hr tablet TAKE 1 TABLET BY MOUTH DAILY   Multiple Vitamins-Minerals (VITAMIN D3 COMPLETE PO) Take by mouth.   No facility-administered medications prior to visit.    Review of Systems  Constitutional:  Negative for appetite change, chills, fatigue and fever.  Respiratory:  Negative for chest tightness and shortness of breath.   Cardiovascular:  Negative for chest pain and palpitations.  Gastrointestinal:  Negative for abdominal pain, nausea and vomiting.  Neurological:  Negative for dizziness and weakness.   Psychiatric/Behavioral:  Positive for sleep disturbance.        Objective    BP 124/82 (BP Location: Right Arm, Patient Position: Sitting, Cuff Size: Large)   Pulse 85   Resp 16   Ht 5\' 2"  (1.575 m)   Wt 200 lb (90.7 kg)   SpO2 96%   BMI 36.58 kg/m  BP Readings from Last 3 Encounters:  04/18/21 124/82  03/09/21 130/83  02/03/20 132/82   Wt Readings from Last 3 Encounters:  04/18/21 200 lb (90.7 kg)  03/09/21 195 lb (88.5 kg)  03/23/20 196 lb 11.2 oz (89.2 kg)      Physical Exam Vitals reviewed.  Constitutional:      General: She is not in acute distress.    Appearance: She is well-developed.  HENT:     Head: Normocephalic and atraumatic.     Right Ear: Hearing normal.     Left Ear: Hearing normal.     Nose: Nose normal.  Eyes:     General: Lids are normal. No scleral icterus.       Right eye: No discharge.        Left eye: No discharge.     Conjunctiva/sclera: Conjunctivae normal.  Cardiovascular:     Rate and Rhythm: Normal rate and regular rhythm.     Heart sounds: Normal heart sounds.  Pulmonary:     Effort: Pulmonary effort is normal. No respiratory distress.  Skin:    Findings: No lesion or rash.  Neurological:  General: No focal deficit present.     Mental Status: She is alert and oriented to person, place, and time.  Psychiatric:        Mood and Affect: Mood normal.        Speech: Speech normal.        Behavior: Behavior normal.        Thought Content: Thought content normal.        Judgment: Judgment normal.      No results found for any visits on 04/18/21.  Assessment & Plan   Results of the Epworth flowsheet 04/18/2021  Sitting and reading 0  Watching TV 3  Sitting, inactive in a public place (e.g. a theatre or a meeting) 0  As a passenger in a car for an hour without a break 1  Lying down to rest in the afternoon when circumstances permit 3  Sitting and talking to someone 0  Sitting quietly after a lunch without alcohol 2  In a car,  while stopped for a few minutes in traffic 0  Total score 9      1. 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  After discussion with patient we will refer her for work-up with sleep apnea specialist. - Ambulatory referral to Neurology  2. Obesity (BMI 35.0-39.9 without comorbidity) Exercise discussed with low-carb diet.    No follow-ups on file.      I, Audrey Durie, MD, have reviewed all documentation for this visit. The documentation on 04/21/21 for the exam, diagnosis, procedures, and orders are all accurate and complete.    Audrey Ailey Cranford Mon, MD  Audrey Koch [phone] (phone) [phone] (fax)  Audrey Koch

## 2021-05-07 ENCOUNTER — Other Ambulatory Visit: Payer: Self-pay

## 2021-05-07 ENCOUNTER — Encounter: Payer: Self-pay | Admitting: Gastroenterology

## 2021-05-07 ENCOUNTER — Ambulatory Visit: Payer: 59 | Admitting: Gastroenterology

## 2021-05-07 VITALS — BP 150/105 | HR 98 | Temp 98.7°F | Ht 62.0 in | Wt 199.6 lb

## 2021-05-07 DIAGNOSIS — L918 Other hypertrophic disorders of the skin: Secondary | ICD-10-CM | POA: Diagnosis not present

## 2021-05-07 DIAGNOSIS — Z1211 Encounter for screening for malignant neoplasm of colon: Secondary | ICD-10-CM | POA: Diagnosis not present

## 2021-05-07 DIAGNOSIS — K640 First degree hemorrhoids: Secondary | ICD-10-CM | POA: Diagnosis not present

## 2021-05-07 DIAGNOSIS — R198 Other specified symptoms and signs involving the digestive system and abdomen: Secondary | ICD-10-CM

## 2021-05-07 MED ORDER — NA SULFATE-K SULFATE-MG SULF 17.5-3.13-1.6 GM/177ML PO SOLN
354.0000 mL | Freq: Once | ORAL | 0 refills | Status: AC
  Filled 2021-05-07 – 2021-05-30 (×2): qty 354, 1d supply, fill #0

## 2021-05-07 NOTE — Progress Notes (Signed)
Cephas Darby, MD [address]  [place]  [place], [place] [address]  Main: [phone]  Fax: [phone]    Gastroenterology Consultation  Referring Provider:     Virginia Crews, MD Primary Care Physician:  Virginia Crews, MD Primary Gastroenterologist:  Dr. Cephas Darby Reason for Consultation:     Symptomatic hemorrhoids        HPI:   Audrey Koch is a 50 y.o. female referred by Dr. Brita Romp, Dionne Bucy, MD  for consultation & management of symptomatic hemorrhoids.  Patient reports that since November last year, she has been experiencing constipation of symptoms including perianal intense itching, burning, pressure, swelling, occasional bright red blood per rectum on wiping, pain and discomfort.  She tried several over-the-counter hemorrhoidal creams and remedies which did not help.  She does report occasional constipation with sensation of incomplete emptying.  She thinks she may not eat enough fiber or drink adequate amounts of water.  Interestingly, for last 2 weeks, majority of her symptoms have significantly improved without any intervention  Patient underwent hemorrhoidectomy on 10/08/2013  Patient does not smoke or drink alcohol She works in the rehab at [employer]  NSAIDs: None  Antiplts/Anticoagulants/Anti thrombotics: None  GI Procedures: None She denies family history of GI malignancy  Past Medical History:  Diagnosis Date   Hyperlipidemia    Hypertension    Migraine     Past Surgical History:  Procedure Laterality Date   HEMORROIDECTOMY  10/08/13   NO PAST SURGERIES     Current Outpatient Medications:    ibuprofen (ADVIL,MOTRIN) 600 MG tablet, Take 1 tablet (600 mg total) by mouth every 6 (six) hours as needed., Disp: 30 tablet, Rfl: 0   Lidocaine-Hydrocortisone Ace 2.8-0.55 % GEL, Use rectally BID prn for hemorrhoid irritation, Disp: 100 g, Rfl: 0   metFORMIN (GLUCOPHAGE) 500 MG tablet, TAKE 1 TABLET BY MOUTH DAILY WITH BREAKFAST.,  Disp: 90 tablet, Rfl: 1   metoprolol succinate (TOPROL-XL) 25 MG 24 hr tablet, TAKE 1 TABLET BY MOUTH DAILY, Disp: 90 tablet, Rfl: 1   Multiple Vitamins-Minerals (VITAMIN D3 COMPLETE PO), Take by mouth., Disp: , Rfl:    Na Sulfate-K Sulfate-Mg Sulf 17.5-3.13-1.6 GM/177ML SOLN, Take 354 mLs by mouth once for 1 dose., Disp: 354 mL, Rfl: 0    Family History  Problem Relation Age of Onset   Heart attack Mother    Hyperlipidemia Mother    Dementia Maternal Grandmother    Diabetes Maternal Grandfather    Dementia Paternal Grandmother    Diabetes Paternal Grandfather    Esophageal cancer Paternal Grandfather    Diabetes Brother    Breast cancer Neg Hx      Social History   Tobacco Use   Smoking status: Never   Smokeless tobacco: Never  Vaping Use   Vaping Use: Never used  Substance Use Topics   Alcohol use: No   Drug use: No    Allergies as of 05/07/2021 - Review Complete 05/07/2021  Allergen Reaction Noted   Sulfa antibiotics Rash 09/07/2013    Review of Systems:    All systems reviewed and negative except where noted in HPI.   Physical Exam:  BP (!) 150/105 (BP Location: Left Arm, Patient Position: Sitting, Cuff Size: Normal)   Pulse 98   Temp 98.7 F (37.1 C) (Oral)   Ht 5\' 2"  (1.575 m)   Wt 199 lb 9.6 oz (90.5 kg)   BMI 36.51 kg/m  No LMP recorded.  General:  Alert,  Well-developed, well-nourished, pleasant and cooperative in NAD Head:  Normocephalic and atraumatic. Eyes:  Sclera clear, no icterus.   Conjunctiva pink. Ears:  Normal auditory acuity. Nose:  No deformity, discharge, or lesions. Mouth:  No deformity or lesions,oropharynx pink & moist. Neck:  Supple; no masses or thyromegaly. Lungs:  Respirations even and unlabored.  Clear throughout to auscultation.   No wheezes, crackles, or rhonchi. No acute distress. Heart:  Regular rate and rhythm; no murmurs, clicks, rubs, or gallops. Abdomen:  Normal bowel sounds. Soft, non-tender and non-distended without  masses, hepatosplenomegaly or hernias noted.  No guarding or rebound tenderness.   Rectal: Large cluster of perianal skin tags, nontender digital rectal exam, palpable external hemorrhoids, hard solid stool in the rectal vault Msk:  Symmetrical without gross deformities. Good, equal movement & strength bilaterally. Pulses:  Normal pulses noted. Extremities:  No clubbing or edema.  No cyanosis. Neurologic:  Alert and oriented x3;  grossly normal neurologically. Skin:  Intact without significant lesions or rashes. No jaundice. Psych:  Alert and cooperative. Normal mood and affect.  Imaging Studies: Reviewed  Assessment and Plan:   Audrey Koch is a 50 y.o. pleasant Caucasian female with obesity, BMI 36.5 is seen in consultation for approximately 1 year history of grade 1 symptomatic hemorrhoids and perianal skin tags  Grade 1 symptomatic hemorrhoids Have discussed about hemorrhoid ligation, including risks and benefits Patient is agreeable to undergo banding today, consent obtained  Colon cancer screening Schedule colonoscopy  Irregular bowel habits Discussed about high-fiber diet and adequate intake of water, information provided Start MiraLAX 1-2 times daily, samples provided  Perianal skin tags If her symptoms are not better after hemorrhoid banding, will refer to surgery for excision of skin tags   Follow up after the colonoscopy   Cephas Darby, MD

## 2021-05-07 NOTE — Progress Notes (Signed)
PROCEDURE NOTE: The patient presents with symptomatic grade 1 hemorrhoids, unresponsive to maximal medical therapy, requesting rubber band ligation of his/her hemorrhoidal disease.  All risks, benefits and alternative forms of therapy were described and informed consent was obtained.  In the Left Lateral Decubitus position (if anoscopy is performed) anoscopic examination revealed grade 1 hemorrhoids in the all position(s).   The decision was made to band the RP internal hemorrhoid, and the [facility] O'Regan System was used to perform band ligation without complication.  Digital anorectal examination was then performed to assure proper positioning of the band, and to adjust the banded tissue as required.  The patient was discharged home without pain or other issues.  Dietary and behavioral recommendations were given and (if necessary - prescriptions were given), along with follow-up instructions.  The patient will return 3 weeks for follow-up and possible additional banding as required.  No complications were encountered and the patient tolerated the procedure well.   

## 2021-05-07 NOTE — Patient Instructions (Signed)
High-Fiber Eating Plan °Fiber, also called dietary fiber, is a type of carbohydrate. It is found foods such as fruits, vegetables, whole grains, and beans. A high-fiber diet can have many health benefits. Your health care provider may recommend a high-fiber diet to help: °Prevent constipation. Fiber can make your bowel movements more regular. °Lower your cholesterol. °Relieve the following conditions: °Inflammation of veins in the anus (hemorrhoids). °Inflammation of specific areas of the digestive tract (uncomplicated diverticulosis). °A problem of the large intestine, also called the colon, that sometimes causes pain and diarrhea (irritable bowel syndrome, or IBS). °Prevent overeating as part of a weight-loss plan. °Prevent heart disease, type 2 diabetes, and certain cancers. °What are tips for following this plan? °Reading food labels ° °Check the nutrition facts label on food products for the amount of dietary fiber. Choose foods that have 5 grams of fiber or more per serving. °The goals for recommended daily fiber intake include: °Men (age 50 or younger): 34-38 g. °Men (over age 50): 28-34 g. °Women (age 50 or younger): 25-28 g. °Women (over age 50): 22-25 g. °Your daily fiber goal is _____________ g. °Shopping °Choose whole fruits and vegetables instead of processed forms, such as apple juice or applesauce. °Choose a wide variety of high-fiber foods such as avocados, lentils, oats, and kidney beans. °Read the nutrition facts label of the foods you choose. Be aware of foods with added fiber. These foods often have high sugar and sodium amounts per serving. °Cooking °Use whole-grain flour for baking and cooking. °Cook with brown rice instead of white rice. °Meal planning °Start the day with a breakfast that is high in fiber, such as a cereal that contains 5 g of fiber or more per serving. °Eat breads and cereals that are made with whole-grain flour instead of refined flour or white flour. °Eat brown rice, bulgur  wheat, or millet instead of white rice. °Use beans in place of meat in soups, salads, and pasta dishes. °Be sure that half of the grains you eat each day are whole grains. °General information °You can get the recommended daily intake of dietary fiber by: °Eating a variety of fruits, vegetables, grains, nuts, and beans. °Taking a fiber supplement if you are not able to take in enough fiber in your diet. It is better to get fiber through food than from a supplement. °Gradually increase how much fiber you consume. If you increase your intake of dietary fiber too quickly, you may have bloating, cramping, or gas. °Drink plenty of water to help you digest fiber. °Choose high-fiber snacks, such as berries, raw vegetables, nuts, and popcorn. °What foods should I eat? °Fruits °Berries. Pears. Apples. Oranges. Avocado. Prunes and raisins. Dried figs. °Vegetables °Sweet potatoes. Spinach. Kale. Artichokes. Cabbage. Broccoli. Cauliflower. Green peas. Carrots. Squash. °Grains °Whole-grain breads. Multigrain cereal. Oats and oatmeal. Brown rice. Barley. Bulgur wheat. Millet. Quinoa. Bran muffins. Popcorn. Rye wafer crackers. °Meats and other proteins °Navy beans, kidney beans, and pinto beans. Soybeans. Split peas. Lentils. Nuts and seeds. °Dairy °Fiber-fortified yogurt. °Beverages °Fiber-fortified soy milk. Fiber-fortified orange juice. °Other foods °Fiber bars. °The items listed above may not be a complete list of recommended foods and beverages. Contact a dietitian for more information. °What foods should I avoid? °Fruits °Fruit juice. Cooked, strained fruit. °Vegetables °Fried potatoes. Canned vegetables. Well-cooked vegetables. °Grains °White bread. Pasta made with refined flour. White rice. °Meats and other proteins °Fatty cuts of meat. Fried chicken or fried fish. °Dairy °Milk. Yogurt. Cream cheese. Sour cream. °Fats and   oils °Butters. °Beverages °Soft drinks. °Other foods °Cakes and pastries. °The items listed above may  not be a complete list of foods and beverages to avoid. Talk with your dietitian about what choices are best for you. °Summary °Fiber is a type of carbohydrate. It is found in foods such as fruits, vegetables, whole grains, and beans. °A high-fiber diet has many benefits. It can help to prevent constipation, lower blood cholesterol, aid weight loss, and reduce your risk of heart disease, diabetes, and certain cancers. °Increase your intake of fiber gradually. Increasing fiber too quickly may cause cramping, bloating, and gas. Drink plenty of water while you increase the amount of fiber you consume. °The best sources of fiber include whole fruits and vegetables, whole grains, nuts, seeds, and beans. °This information is not intended to replace advice given to you by your health care provider. Make sure you discuss any questions you have with your health care provider. °Document Revised: 11/18/2019 Document Reviewed: 11/18/2019 °Elsevier Patient Education © 2022 [employer] Inc. ° °

## 2021-05-08 ENCOUNTER — Ambulatory Visit (INDEPENDENT_AMBULATORY_CARE_PROVIDER_SITE_OTHER): Payer: 59 | Admitting: Neurology

## 2021-05-08 ENCOUNTER — Other Ambulatory Visit: Payer: Self-pay

## 2021-05-08 ENCOUNTER — Encounter: Payer: Self-pay | Admitting: Neurology

## 2021-05-08 VITALS — BP 149/99 | HR 75 | Ht 62.0 in | Wt 197.0 lb

## 2021-05-08 DIAGNOSIS — J342 Deviated nasal septum: Secondary | ICD-10-CM | POA: Diagnosis not present

## 2021-05-08 DIAGNOSIS — G43109 Migraine with aura, not intractable, without status migrainosus: Secondary | ICD-10-CM | POA: Diagnosis not present

## 2021-05-08 DIAGNOSIS — R351 Nocturia: Secondary | ICD-10-CM | POA: Insufficient documentation

## 2021-05-08 DIAGNOSIS — G4719 Other hypersomnia: Secondary | ICD-10-CM | POA: Diagnosis not present

## 2021-05-08 DIAGNOSIS — M2619 Other specified anomalies of jaw-cranial base relationship: Secondary | ICD-10-CM

## 2021-05-08 DIAGNOSIS — R5382 Chronic fatigue, unspecified: Secondary | ICD-10-CM

## 2021-05-08 DIAGNOSIS — E669 Obesity, unspecified: Secondary | ICD-10-CM

## 2021-05-08 DIAGNOSIS — F419 Anxiety disorder, unspecified: Secondary | ICD-10-CM

## 2021-05-08 DIAGNOSIS — R0683 Snoring: Secondary | ICD-10-CM | POA: Insufficient documentation

## 2021-05-08 MED ORDER — ALPRAZOLAM 0.5 MG PO TABS
0.5000 mg | ORAL_TABLET | Freq: Every evening | ORAL | 0 refills | Status: DC | PRN
  Filled 2021-05-08 – 2021-05-30 (×2): qty 2, 2d supply, fill #0

## 2021-05-08 MED ORDER — MOMETASONE FUROATE 50 MCG/ACT NA SUSP
2.0000 | Freq: Every day | NASAL | 12 refills | Status: DC
  Filled 2021-05-08: qty 1, fill #0

## 2021-05-08 NOTE — Patient Instructions (Signed)
Fatigue If you have fatigue, you feel tired all the time and have a lack of energy or a lack of motivation. Fatigue may make it difficult to start or complete tasks because of exhaustion. In general, occasional or mild fatigue is often a normal response to activity or life. However, long-lasting (chronic) or extreme fatigue may be a symptom of a medical condition. Follow these instructions at home: General instructions Watch your fatigue for any changes. Go to bed and get up at the same time every day. Avoid fatigue by pacing yourself during the day and getting enough sleep at night. Maintain a healthy weight. Medicines Take over-the-counter and prescription medicines only as told by your health care provider. Take a multivitamin, if told by your health care provider.  Do not use herbal or dietary supplements unless they are approved by your health care provider. Activity  Exercise regularly, as told by your health care provider. Use or practice techniques to help you relax, such as yoga, tai chi, meditation, or massage therapy. Eating and drinking  Avoid heavy meals in the evening. Eat a well-balanced diet, which includes lean proteins, whole grains, plenty of fruits and vegetables, and low-fat dairy products. Avoid consuming too much caffeine. Avoid the use of alcohol. Drink enough fluid to keep your urine pale yellow. Lifestyle Change situations that cause you stress. Try to keep your work and personal schedule in balance. Do not use any products that contain nicotine or tobacco, such as cigarettes and e-cigarettes. If you need help quitting, ask your health care provider. Do not use drugs. Contact a health care provider if: Your fatigue does not get better. You have a fever. You suddenly lose or gain weight. You have headaches. You have trouble falling asleep or sleeping through the night. You feel angry, guilty, anxious, or sad. You are unable to have a bowel movement  (constipation). Your skin is dry. You have swelling in your legs or another part of your body. Get help right away if: You feel confused. Your vision is blurry. You feel faint or you pass out. You have a severe headache. You have severe pain in your abdomen, your back, or the area between your waist and hips (pelvis). You have chest pain, shortness of breath, or an irregular or fast heartbeat. You are unable to urinate, or you urinate less than normal. You have abnormal bleeding, such as bleeding from the rectum, vagina, nose, lungs, or nipples. You vomit blood. You have thoughts about hurting yourself or others. If you ever feel like you may hurt yourself or others, or have thoughts about taking your own life, get help right away. You can go to your nearest emergency department or call: Your local emergency services (911 in the [place] A suicide crisis helpline, such as the [facility] at 339-852-4039. This is open 24 hours a day. Summary If you have fatigue, you feel tired all the time and have a lack of energy or a lack of motivation. Fatigue may make it difficult to start or complete tasks because of exhaustion. Long-lasting (chronic) or extreme fatigue may be a symptom of a medical condition. Exercise regularly, as told by your health care provider. Change situations that cause you stress. Try to keep your work and personal schedule in balance. This information is not intended to replace advice given to you by your health care provider. Make sure you discuss any questions you have with your health care provider. Document Revised: 05/25/2020 Document Reviewed: 05/25/2020 Elsevier  Patient Education  2022 [employer]. Chronic Fatigue Syndrome Chronic fatigue syndrome (CFS) is a condition that causes extreme tiredness (fatigue). This condition is also known as myalgic encephalomyelitis (ME). The fatigue in CFS does not improve with rest, and it gets worse with  physical or mental activity. Several other symptoms may occur along with fatigue. Symptoms may come and go, but they generally last for months. Sometimes, CFS gets better over time. In other cases, it can be a lifelong condition. There is no cure, but there are many possible treatments. You will need to work with your health care providers to find a treatment plan that works best for you. What are the causes? The cause of this condition is not known. CFS may be caused by a combination of things. Possible causes include: An infection. An abnormal body defense system (abnormal immune system). Low blood pressure. Poor diet. Living with a lot of physical or emotional stress. What increases the risk? The following factors may make you more likely to develop this condition: Being female. Being 12-73 years old. Having a family history of CFS. What are the signs or symptoms? The main symptom of this condition is fatigue that is severe enough to interfere with day-to-day activities. This fatigue does not get better with rest, and it gets worse with physical or mental activity. There are eight other major symptoms of CFS: Discomfort and lack of energy (malaise) that lasts more than 24 hours after physical activity. Sleep that does not relieve fatigue (unrefreshing sleep). Short-term memory loss or confusion. Joint pain without redness or swelling. Muscle aches. Headaches. Painful and swollen glands (lymph nodes) in the neck or under the arms. Sore throat. Other symptoms can include: Cramps in the abdomen, constipation, or diarrhea (irritable bowel syndrome). Chills and night sweats. Vision changes. Dizziness and mental confusion (brain fog). Clumsiness. Sensitivity to food, noise, or odors. Mood swings, depression, or anxiety attacks. How is this diagnosed? There are no tests that can diagnose this condition. Your health care provider will make the diagnosis based on: Your symptoms and  medical history. A physical exam and a mental health exam. Tests to rule out other conditions. It is important to make sure that your symptoms are not caused by another medical condition. Tests may include lab tests or X-rays. For your health care provider to diagnose CFS: You must have had fatigue for at least 6 straight months. Fatigue must be your first symptom, and it must be severe enough to interfere with day-to-day activities. You must also have at least four of the eight other major symptoms of CFS. There must be no other cause found for the fatigue. How is this treated? There is no cure for CFS. The condition affects everyone differently. You will need to work with your team of health care providers to find the best treatments for your symptoms. Your team may include your primary care provider, physical and exercise therapists, and mental health therapists. Treatment may include: Having a regular bedtime routine to help improve your sleep. Avoiding caffeine, alcohol, and tobacco or nicotine products. Doing light exercise and stretching during the day. You may also want to try movement exercises, such as yoga or tai chi. Taking medicines to help you sleep or to relieve joint or muscle pain. Learning and practicing relaxation techniques, such as deep breathing and muscle relaxation. Using memory aids or doing brainteasers to improve memory and concentration. Getting care for your body and mental well-being, such as: Seeing a mental health therapist  to evaluate and treat depression, if necessary. Cognitive behavioral therapy (CBT). This therapy changes the way you think or act in response to the fatigue. This may help improve how you feel. Trying massage therapy and acupuncture. Follow these instructions at home: Eating and drinking  Avoid caffeine and alcohol. Avoid heavy meals in the evening. Eat a healthy diet that includes foods such as vegetables, fruits, fish, and lean  meats. Activity Rest as told by your health care provider. Avoid fatigue by pacing yourself during the day and getting enough sleep at night. Exercise regularly, as told by your health care provider. Go to bed and get up at the same time every day. Lifestyle Ask your health care provider whether you should keep a diary. Your health care provider will tell you what information to write in the diary. This may include when you have fatigue and how medicines and other behaviors or treatments help to reduce the fatigue. Consider joining a CFS support group. Avoid stress and use stress-reducing techniques that you learn in therapy. General instructions  Take over-the-counter and prescription medicines only as told by your health care provider. Do not use herbal or dietary supplements unless they are approved by your health care provider. Maintain a healthy weight. Do not use any products that contain nicotine or tobacco, such as cigarettes, e-cigarettes, and chewing tobacco. If you need help quitting, ask your health care provider. Keep all follow-up visits as told by your health care provider. This is important. Where to find more information Get more information or find a support group near you at one of these links: American Myalgic Encephalomyelitis and Chronic Fatigue Syndrome Society: ammes.org Centers for Disease Control and Prevention: http://www.wolf.info/ Contact a health care provider if: Your symptoms do not get better or they get worse. You feel angry, guilty, anxious, or depressed. Get help right away if: You have thoughts of self-harm. If you ever feel like you may hurt yourself or others, or have thoughts about taking your own life, get help right away. Go to your nearest emergency department or: Call your local emergency services (911 in the [place] Call a suicide crisis helpline, such as the [facility] at 430-469-5493. This is open 24 hours a day in the  [place] Text the Crisis Text Line at 680-431-4086 (in the [place].). Summary Chronic fatigue syndrome (CFS) is a condition that causes extreme tiredness (fatigue). This fatigue does not improve with rest, and it gets worse with physical or mental activity. There is no cure for CFS. The condition affects everyone differently. You will need to work with your team of health care providers to find the best treatments for your symptoms. Exercise regularly, as told by your health care provider. Avoid stress and use stress-reducing techniques that you learn in therapy. Contact a health care provider if your symptoms do not get better or they get worse. This information is not intended to replace advice given to you by your health care provider. Make sure you discuss any questions you have with your health care provider. Document Revised: 06/28/2019 Document Reviewed: 06/28/2019 Elsevier Patient Education  2022 [employer]. Hypersomnia Hypersomnia is a condition in which a person feels very tired during the day even though he or she gets plenty of sleep at night. A person with this condition may take naps during the day and may find it very difficult to wake up from sleep. Hypersomnia may affect a person's ability to think, concentrate, drive, or remember things. What are  the causes? The cause of this condition may not be known. Possible causes include: Certain medicines. Sleep disorders, such as narcolepsy and sleep apnea. Injury to the head, brain, or spinal cord. Drug or alcohol use. Gastroesophageal reflux disease (GERD). Tumors. Certain medical conditions, such as depression, diabetes, or an underactive thyroid gland (hypothyroidism). What are the signs or symptoms? The main symptoms of hypersomnia include: Feeling very tired throughout the day, regardless of how much sleep you got the night before. Having trouble waking up. Others may find it difficult to wake you up when you are sleeping. Sleeping for  longer and longer periods at a time. Taking naps throughout the day. Other symptoms may include: Feeling restless, anxious, or annoyed. Lacking energy. Having trouble with: Remembering. Speaking. Thinking. Loss of appetite. Seeing, hearing, tasting, smelling, or feeling things that are not real (hallucinations). How is this diagnosed? This condition may be diagnosed based on: Your symptoms and medical history. Your sleeping habits. Your health care provider may ask you to write down your sleeping habits in a daily sleep log, along with any symptoms you have. A series of tests that are done while you sleep (sleep study or polysomnogram). A test that measures how quickly you can fall asleep during the day (daytime nap study or multiple sleep latency test). How is this treated? Treatment can help you manage your condition. Treatment may include: Following a regular sleep routine. Lifestyle changes, such as changing your eating habits, getting regular exercise, and avoiding alcohol or caffeinated beverages. Taking medicines to make you more alert (stimulants) during the day. Treating any underlying medical causes of hypersomnia. Follow these instructions at home: Sleep routine  Schedule the same bedtime and wake-up time each day. Practice a relaxing bedtime routine. This may include reading, meditation, deep breathing, or taking a warm bath before going to sleep. Get regular exercise each day. Avoid strenuous exercise in the evening hours. Keep your sleep environment at a cooler temperature, darkened, and quiet. Sleep with pillows and a mattress that are comfortable and supportive. Schedule short 20-minute naps for when you feel sleepiest during the day. Talk with your employer or teachers about your hypersomnia. If possible, adjust your schedule so that: You have a regular daytime work schedule. You can take a scheduled nap during the day. You do not have to work or be active at  night. Do not eat a heavy meal for a few hours before bedtime. Eat your meals at about the same times every day. Avoid drinking alcohol or caffeinated beverages. Safety  Do not drive or use heavy machinery if you are sleepy. Ask your health care provider if it is safe for you to drive. Wear a life jacket when swimming or spending time near water. General instructions Take supplements and over-the-counter and prescription medicines only as told by your health care provider. Keep a sleep log that will help your doctor manage your condition. This may include information about: What time you go to bed each night. How often you wake up at night. How many hours you sleep at night. How often and for how long you nap during the day. Any observations from others, such as leg movements during sleep, sleep walking, or snoring. Keep all follow-up visits as told by your health care provider. This is important. Contact a health care provider if: You have new symptoms. Your symptoms get worse. Get help right away if: You have serious thoughts about hurting yourself or someone else. If you ever feel like  you may hurt yourself or others, or have thoughts about taking your own life, get help right away. You can go to your nearest emergency department or call: Your local emergency services (911 in the [place] A suicide crisis helpline, such as the [facility] at 628-513-3385. This is open 24 hours a day. Summary Hypersomnia refers to a condition in which you feel very tired during the day even though you get plenty of sleep at night. A person with this condition may take naps during the day and may find it very difficult to wake up from sleep. Hypersomnia may affect a person's ability to think, concentrate, drive, or remember things. Treatment, such as following a regular sleep routine and making some lifestyle changes, can help you manage your condition. This information is not  intended to replace advice given to you by your health care provider. Make sure you discuss any questions you have with your health care provider. Document Revised: 05/25/2020 Document Reviewed: 05/25/2020 Elsevier Patient Education  2022 [employer].

## 2021-05-08 NOTE — Progress Notes (Signed)
SLEEP MEDICINE CLINIC    Provider:  Larey Seat, MD  Primary Care Physician:  Virginia Crews, [address] [place] [address]     Referring Provider: Jerrol Banana., Md [address] [place],  [place] [address]          Chief Complaint according to patient   Patient presents with:     New Patient (Initial Visit)           HISTORY OF PRESENT ILLNESS:  Audrey Koch is a 50 y.o. Caucasian female patient seen here as a referral by Dr. Rosanna Randy, MD , on 05/08/2021  for a sleep consultation.   Chief concern according to patient :  05-08-2021, Internal referral for snoring. Prior sleep study in 2020 in [place], [place], , slept 70min. Pt tried to have another SS and was unable to get it done and has put it off til now. Reports snoring has gotten worse. Avg sleep about 6-7hr. C/o of fatigue and daytime sleepiness. 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    I have the pleasure of seeing Audrey Koch , a right-handed Caucasian female with a possible sleep disorder.  She  has a past medical history of Hyperlipidemia, Hypertension, and Migraine. Migraines around her menstrual cycle, regular , now 2 times a month. Visual auras, nausea and vomiting in her twenties. Motion sickness and ice cream headaches.      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   Family medical /sleep history: father passed away at age 81 with OSA.   Social history:  Patient is working as Secretary/administrator and lives in a household with spouse and daughter, Neurosurgeon and dog. Tobacco use: none .  ETOH use none , Caffeine intake in form of Coffee( /) Soda( 12 ounces a day) Tea ( /) or energy drinks. Regular exercise: none .   Hobbies : hiking, paddle boarding, kayaking.       Sleep habits are as follows: The patient's dinner time is between 7 PM. The patient goes to bed at 10.30  Pm and reports no difficulties to initiate  sleep but  wakes for 2-4 bathroom breaks. Bedroom is cool, quiet and dark, light locking curtain, no TV,  The preferred sleep position is sideways, prone-, with the support of 1 pillow, flat bed. Dreams are reportedly frequent.  7.15  AM is the usual rise time. The patient wakes up with an alarm at 7 AM.  She reports not feeling refreshed or restored in AM, with symptoms such as dry mouth, morning headaches, and residual fatigue. Naps are taken frequently, lasting from 45 to 60 minutes and are more refreshing than nocturnal sleep.    Review of Systems: Out of a complete 14 system review, the patient complains of only the following symptoms, and all other reviewed systems are negative.:  Fatigue, sleepiness , snoring, fragmented sleep due to nocturia.   Migraines in a cyclic pattern, 2-3 days a month.    How likely are you to doze in the following situations: 0 = not likely, 1 = slight chance, 2 = moderate chance, 3 = high chance   Sitting and Reading? Watching Television? Sitting inactive in a public place (theater or meeting)? As a passenger in a car for an hour without a break? Lying down in the afternoon when circumstances permit? Sitting and talking to someone? Sitting quietly after lunch without alcohol?  In a car, while stopped for a few minutes in traffic?   Total = 11/ 24 points   FSS endorsed at 55/ 63 points.   Social History   Socioeconomic History   Marital status: Married    Spouse name: bobby   Number of children: 2   Years of education: Not on file   Highest education level: Master's degree (e.g., MA, MS, MEng, MEd, MSW, MBA)  Occupational History   Not on file  Tobacco Use   Smoking status: Never   Smokeless tobacco: Never  Vaping Use   Vaping Use: Never used  Substance and Sexual Activity   Alcohol use: No   Drug use: No   Sexual activity: Not on file  Other Topics Concern   Not on file  Social History Narrative   Live with husband and daughter Lauren    Right handed   Caffeine: 12oz of caffeine a day   Social Determinants of Health   Financial Resource Strain: Not on file  Food Insecurity: Not on file  Transportation Needs: Not on file  Physical Activity: Not on file  Stress: Not on file  Social Connections: Not on file    Family History  Problem Relation Age of Onset   Heart attack Mother    Hyperlipidemia Mother    Dementia Maternal Grandmother    Diabetes Maternal Grandfather    Dementia Paternal Grandmother    Diabetes Paternal Grandfather    Esophageal cancer Paternal Grandfather    Diabetes Brother    Breast cancer Neg Hx     Past Medical History:  Diagnosis Date   Hyperlipidemia    Hypertension    Migraine     Past Surgical History:  Procedure Laterality Date   HEMORROIDECTOMY  10/08/13   NO PAST SURGERIES       Current Outpatient Medications on File Prior to Visit  Medication Sig Dispense Refill   ibuprofen (ADVIL,MOTRIN) 600 MG tablet Take 1 tablet (600 mg total) by mouth every 6 (six) hours as needed. 30 tablet 0   Lidocaine-Hydrocortisone Ace 2.8-0.55 % GEL Use rectally BID prn for hemorrhoid irritation 100 g 0   metoprolol succinate (TOPROL-XL) 25 MG 24 hr tablet TAKE 1 TABLET BY MOUTH DAILY 90 tablet 1   Multiple Vitamins-Minerals (VITAMIN D3 COMPLETE PO) Take by mouth.     Na Sulfate-K Sulfate-Mg Sulf 17.5-3.13-1.6 GM/177ML SOLN Take 354 mLs by mouth once for 1 dose. 354 mL 0   No current facility-administered medications on file prior to visit.    Allergies  Allergen Reactions   Sulfa Antibiotics Rash    Physical exam:  Today's Vitals   05/08/21 0941  BP: (!) 149/99  Pulse: 75  Weight: 197 lb (89.4 kg)  Height: 5\' 2"  (1.575 m)   Body mass index is 36.03 kg/m.   Wt Readings from Last 3 Encounters:  05/08/21 197 lb (89.4 kg)  05/07/21 199 lb 9.6 oz (90.5 kg)  04/18/21 200 lb (90.7 kg)     Ht Readings from Last 3 Encounters:  05/08/21 5\' 2"  (1.575 m)  05/07/21 5\' 2"  (1.575 m)   04/18/21 5\' 2"  (1.575 m)      General: The patient is awake, alert and appears not in acute distress. The patient is well groomed. Head: Normocephalic, atraumatic. Neck is supple. Mallampati 3, scalloped tongue, small lower jaw,  neck circumference:15.5  inches .  Large breasts.  Snoring in all positions.   Nasal airflow patent, but nasal septum is deviated to  the left- .  Retrognathia is seen.  Dental status:  Cardiovascular:  Regular rate and cardiac rhythm by pulse,  without distended neck veins. Respiratory: Lungs are clear to auscultation.  Skin:  Without evidence of ankle edema, or rash. Trunk: The patient's posture is erect.   Neurologic exam : The patient is awake and alert, oriented to place and time.   Memory subjective described as intact.  Attention span & concentration ability appears normal.  Speech is fluent,  without  dysarthria, dysphonia or aphasia.  Mood and affect are appropriate.   Cranial nerves: no loss of smell or taste reported  Pupils are equal and briskly reactive to light. Funduscopic exam deferred.  Extraocular movements in vertical and horizontal planes were intact and without nystagmus. No Diplopia. Visual fields by finger perimetry are intact. Hearing was intact to soft voice and finger rubbing. Facial sensation intact to fine touch. Facial motor strength is symmetric and tongue and uvula move midline.  Neck ROM : rotation, tilt and flexion extension were normal for age and shoulder shrug was symmetrical.    Motor exam:  Symmetric bulk, tone and ROM.   Normal tone without cog-wheeling, symmetric grip strength .   Sensory:  Fine touch, pinprick and vibration were intact.   Proprioception tested in the upper extremities was normal.   Coordination: Rapid alternating movements in the fingers/hands were of normal speed.  The Finger-to-nose maneuver was intact without evidence of ataxia, dysmetria or tremor.   Gait and station: Patient could rise  unassisted from a seated position, walked without assistive device.  Stance is of normal width/ base and the patient turned with 3 steps.  Toe and heel walk were deferred.  Deep tendon reflexes: in the  upper and lower extremities are symmetric and intact.  Babinski response was deferred .       After spending a total time of  60  minutes face to face including additional time for physical and neurologic examination, review of laboratory studies,  personal review of imaging studies, reports and results of other testing and review of referral information / records as far as provided in visit, I have established the following assessments:  1)  anatomical risk factors for OSA are present: The patient has a small and crowded lower jaw is irregular dentition, scalloped tongue is a sign that her airway is narrowed, Mallampati is high-grade, neck size is average at 15.5 inches, BMI is elevated at 36 and will also contribute to a higher likelihood of snoring.  Nasal deviation, sinus history, mouth breathing.  I do think that but her husband has noted that she snores in all sleep positions is important for the further evaluation.  The patient does endorse a higher than average degree of sleepiness.   There is also a higher than average degree of fatigue.   My Plan is to proceed with:  1) HST or PSG are good options for her, her frequent nocturia makes her worried about sleeping in the lab. 2) I will offer her a sleep aid, should her insurance allow for in lab sleep study.  3) nocturia can be a result of OSA.  4) HTN can be a sign of OSA.  5) possibly anxiety/ depression affecting fatigue and insomnia when in a strange environment.   I would like to thank Brita Romp Dionne Bucy, MD and Jerrol Banana., Md [address] [place],  [place] [address] for allowing me to meet with and to take care of  this pleasant patient.   In short, Donnis T Apachito is presenting with EDS and snoring,  nocturia and fatigue , a symptom that can be attributed to OSA.  I plan to follow up either personally or through our NP within 2-4 month.    Electronically signed by: Larey Seat, MD 05/08/2021 10:10 AM  [facility] Board certified by The [employer] of Sleep Medicine and Diplomate of the [employer] of Sleep Medicine. Board certified In Neurology through the [place], Fellow of the [employer] of Neurology. Medical Director of [facility].

## 2021-05-23 ENCOUNTER — Telehealth: Payer: Self-pay

## 2021-05-23 NOTE — Telephone Encounter (Signed)
LVM for pt to call me back to schedule sleep study  

## 2021-05-25 ENCOUNTER — Other Ambulatory Visit: Payer: Self-pay

## 2021-05-30 ENCOUNTER — Other Ambulatory Visit: Payer: Self-pay

## 2021-05-30 ENCOUNTER — Ambulatory Visit: Payer: 59 | Admitting: Neurology

## 2021-05-30 DIAGNOSIS — R0683 Snoring: Secondary | ICD-10-CM

## 2021-05-30 DIAGNOSIS — G43109 Migraine with aura, not intractable, without status migrainosus: Secondary | ICD-10-CM

## 2021-05-30 DIAGNOSIS — F419 Anxiety disorder, unspecified: Secondary | ICD-10-CM

## 2021-05-30 DIAGNOSIS — M2619 Other specified anomalies of jaw-cranial base relationship: Secondary | ICD-10-CM

## 2021-05-30 DIAGNOSIS — E669 Obesity, unspecified: Secondary | ICD-10-CM

## 2021-05-30 DIAGNOSIS — R5382 Chronic fatigue, unspecified: Secondary | ICD-10-CM

## 2021-05-30 DIAGNOSIS — G4719 Other hypersomnia: Secondary | ICD-10-CM

## 2021-05-30 DIAGNOSIS — G4733 Obstructive sleep apnea (adult) (pediatric): Secondary | ICD-10-CM

## 2021-06-01 ENCOUNTER — Encounter: Payer: Self-pay | Admitting: Gastroenterology

## 2021-06-04 ENCOUNTER — Encounter
Admission: RE | Disposition: A | Discharge status: Home / Self Care | Payer: Self-pay | Source: Home / Self Care | Attending: Gastroenterology

## 2021-06-04 ENCOUNTER — Ambulatory Visit: Payer: 59 | Admitting: Anesthesiology

## 2021-06-04 ENCOUNTER — Other Ambulatory Visit: Payer: Self-pay

## 2021-06-04 ENCOUNTER — Encounter: Payer: Self-pay | Admitting: Gastroenterology

## 2021-06-04 ENCOUNTER — Ambulatory Visit
Admission: RE | Admit: 2021-06-04 | Discharge: 2021-06-04 | Disposition: A | Discharge status: Home / Self Care | Payer: 59 | Attending: Gastroenterology | Admitting: Gastroenterology

## 2021-06-04 DIAGNOSIS — Z1211 Encounter for screening for malignant neoplasm of colon: Secondary | ICD-10-CM

## 2021-06-04 DIAGNOSIS — F419 Anxiety disorder, unspecified: Secondary | ICD-10-CM | POA: Diagnosis not present

## 2021-06-04 DIAGNOSIS — I10 Essential (primary) hypertension: Secondary | ICD-10-CM | POA: Insufficient documentation

## 2021-06-04 DIAGNOSIS — K644 Residual hemorrhoidal skin tags: Secondary | ICD-10-CM | POA: Insufficient documentation

## 2021-06-04 DIAGNOSIS — K635 Polyp of colon: Secondary | ICD-10-CM | POA: Diagnosis not present

## 2021-06-04 DIAGNOSIS — K626 Ulcer of anus and rectum: Secondary | ICD-10-CM | POA: Insufficient documentation

## 2021-06-04 DIAGNOSIS — E785 Hyperlipidemia, unspecified: Secondary | ICD-10-CM | POA: Insufficient documentation

## 2021-06-04 DIAGNOSIS — L539 Erythematous condition, unspecified: Secondary | ICD-10-CM | POA: Diagnosis not present

## 2021-06-04 DIAGNOSIS — D123 Benign neoplasm of transverse colon: Secondary | ICD-10-CM | POA: Diagnosis not present

## 2021-06-04 DIAGNOSIS — F32A Depression, unspecified: Secondary | ICD-10-CM | POA: Diagnosis not present

## 2021-06-04 HISTORY — PX: COLONOSCOPY WITH PROPOFOL: SHX5780

## 2021-06-04 LAB — POCT PREGNANCY, URINE: Preg Test, Ur: NEGATIVE

## 2021-06-04 SURGERY — COLONOSCOPY WITH PROPOFOL
Anesthesia: General

## 2021-06-04 MED ORDER — PROPOFOL 500 MG/50ML IV EMUL
INTRAVENOUS | Status: DC | PRN
  Administered 2021-06-04: 175 ug/kg/min via INTRAVENOUS

## 2021-06-04 MED ORDER — PROPOFOL 500 MG/50ML IV EMUL
INTRAVENOUS | Status: AC
  Filled 2021-06-04: qty 50

## 2021-06-04 MED ORDER — SODIUM CHLORIDE 0.9 % IV SOLN: INTRAVENOUS | Status: DC

## 2021-06-04 MED ORDER — PROPOFOL 10 MG/ML IV BOLUS
INTRAVENOUS | Status: DC | PRN
  Administered 2021-06-04: 50 mg via INTRAVENOUS
  Administered 2021-06-04: 70 mg via INTRAVENOUS

## 2021-06-04 MED ORDER — LIDOCAINE HCL (CARDIAC) PF 100 MG/5ML IV SOSY
PREFILLED_SYRINGE | INTRAVENOUS | Status: DC | PRN
  Administered 2021-06-04: 50 mg via INTRAVENOUS

## 2021-06-04 NOTE — Anesthesia Procedure Notes (Signed)
Date/Time: 06/04/2021 10:05 AM Performed by: Johnna Acosta, CRNA Pre-anesthesia Checklist: Patient identified, Emergency Drugs available, Suction available, Patient being monitored and Timeout performed Patient Re-evaluated:Patient Re-evaluated prior to induction Oxygen Delivery Method: Nasal cannula Preoxygenation: Pre-oxygenation with 100% oxygen Induction Type: IV induction

## 2021-06-04 NOTE — Anesthesia Preprocedure Evaluation (Addendum)
Anesthesia Evaluation  Patient identified by MRN, date of birth, ID band Patient awake    Reviewed: Allergy & Precautions, NPO status , Patient's Chart, lab work & pertinent test results  Airway Mallampati: II  TM Distance: >3 FB Neck ROM: full    Dental  (+) Teeth Intact   Pulmonary neg pulmonary ROS,    Pulmonary exam normal breath sounds clear to auscultation       Cardiovascular Exercise Tolerance: Good hypertension, Pt. on medications negative cardio ROS Normal cardiovascular exam Rhythm:Regular Rate:Normal     Neuro/Psych  Headaches, Anxiety Depression negative neurological ROS  negative psych ROS   GI/Hepatic negative GI ROS, Neg liver ROS,   Endo/Other  negative endocrine ROS  Renal/GU negative Renal ROS  negative genitourinary   Musculoskeletal negative musculoskeletal ROS (+)   Abdominal (+) + obese,   Peds negative pediatric ROS (+)  Hematology negative hematology ROS (+)   Anesthesia Other Findings Past Medical History: No date: Hyperlipidemia No date: Hypertension No date: Migraine  Past Surgical History: 10/08/2013: HEMORROIDECTOMY  BMI    Body Mass Index: 35.67 kg/m      Reproductive/Obstetrics negative OB ROS                            Anesthesia Physical Anesthesia Plan  ASA: 2  Anesthesia Plan: General   Post-op Pain Management:    Induction:   PONV Risk Score and Plan: Propofol infusion and TIVA  Airway Management Planned: Nasal Cannula  Additional Equipment:   Intra-op Plan:   Post-operative Plan:   Informed Consent: I have reviewed the patients History and Physical, chart, labs and discussed the procedure including the risks, benefits and alternatives for the proposed anesthesia with the patient or authorized representative who has indicated his/her understanding and acceptance.     Dental Advisory Given  Plan Discussed with: CRNA and  Surgeon  Anesthesia Plan Comments:         Anesthesia Quick Evaluation

## 2021-06-04 NOTE — Transfer of Care (Signed)
Immediate Anesthesia Transfer of Care Note  Patient: Audrey Koch  Procedure(s) Performed: COLONOSCOPY WITH PROPOFOL  Patient Location: PACU  Anesthesia Type:General  Level of Consciousness: drowsy  Airway & Oxygen Therapy: Patient Spontanous Breathing  Post-op Assessment: Report given to RN and Post -op Vital signs reviewed and stable  Post vital signs: Reviewed and stable  Last Vitals:  Vitals Value Taken Time  BP 88/68 06/04/21 1027  Temp 36.1 C 06/04/21 1026  Pulse 90 06/04/21 1027  Resp 20 06/04/21 1027  SpO2 93 % 06/04/21 1027    Last Pain:  Vitals:   06/04/21 1026  TempSrc: Tympanic  PainSc: Asleep         Complications: No notable events documented.

## 2021-06-04 NOTE — Op Note (Signed)
[name] Gastroenterology Patient Name: Audrey Koch Procedure Date: 06/04/2021 9:56 AM MRN: [id number] Account #: [id number] Date of Birth: [birth year] Admit Type: Outpatient Age: 50 Room: [facility] ENDO ROOM 1 Gender: Female Note Status: Finalized Instrument Name: Jasper Riling [id number] Procedure:             Colonoscopy Indications:           Screening for colorectal malignant neoplasm, This is                         the patient's first colonoscopy Providers:             Lin Landsman MD, MD Referring MD:          Dionne Bucy. Bacigalupo (Referring MD) Medicines:             General Anesthesia Complications:         No immediate complications. Estimated blood loss: None. Procedure:             Pre-Anesthesia Assessment:                        - Prior to the procedure, a History and Physical was                         performed, and patient medications and allergies were                         reviewed. The patient is competent. The risks and                         benefits of the procedure and the sedation options and                         risks were discussed with the patient. All questions                         were answered and informed consent was obtained.                         Patient identification and proposed procedure were                         verified by the physician, the nurse, the                         anesthesiologist, the anesthetist and the technician                         in the pre-procedure area in the procedure room in the                         endoscopy suite. Mental Status Examination: alert and                         oriented. Airway Examination: normal oropharyngeal                         airway and neck mobility. Respiratory Examination:  clear to auscultation. CV Examination: normal.                         Prophylactic Antibiotics: The patient does not require                         prophylactic  antibiotics. Prior Anticoagulants: The                         patient has taken no previous anticoagulant or                         antiplatelet agents. ASA Grade Assessment: II - A                         patient with mild systemic disease. After reviewing                         the risks and benefits, the patient was deemed in                         satisfactory condition to undergo the procedure. The                         anesthesia plan was to use general anesthesia.                         Immediately prior to administration of medications,                         the patient was re-assessed for adequacy to receive                         sedatives. The heart rate, respiratory rate, oxygen                         saturations, blood pressure, adequacy of pulmonary                         ventilation, and response to care were monitored                         throughout the procedure. The physical status of the                         patient was re-assessed after the procedure.                        After obtaining informed consent, the colonoscope was                         passed under direct vision. Throughout the procedure,                         the patient's blood pressure, pulse, and oxygen                         saturations were monitored continuously. The  Colonoscope was introduced through the anus and                         advanced to the the cecum, identified by appendiceal                         orifice and ileocecal valve. The colonoscopy was                         performed without difficulty. The patient tolerated                         the procedure well. The quality of the bowel                         preparation was evaluated using the BBPS [facility] Bowel                         Preparation Scale) with scores of: Right Colon = 3,                         Transverse Colon = 3 and Left Colon = 3 (entire mucosa                          seen well with no residual staining, small fragments                         of stool or opaque liquid). The total BBPS score                         equals 9. Findings:      The perianal and digital rectal examinations were normal. Pertinent       negatives include normal sphincter tone and no palpable rectal lesions.      Skin tags were found on perianal exam.      The terminal ileum appeared normal.      A 5 mm polyp was found in the transverse colon. The polyp was sessile.       The polyp was removed with a cold snare. Resection and retrieval were       complete.      A diffuse area of mildly erythematous mucosa was found in the distal       rectum. Biopsies were taken with a cold forceps for histology.      A single (solitary) three mm healing post banding ulcer was found in the       distal rectum. No bleeding was present. No stigmata of recent bleeding       were seen. Impression:            - Perianal skin tags found on perianal exam.                        - The examined portion of the ileum was normal.                        - One 5 mm polyp in the transverse colon, removed with  a cold snare. Resected and retrieved.                        - Erythematous mucosa in the distal rectum. Biopsied.                        - A single (solitary) ulcer in the distal rectum. Recommendation:        - Discharge patient to home (with escort).                        - Resume previous diet today.                        - Continue present medications.                        - Await pathology results.                        - Repeat colonoscopy in 5 to 7 years for surveillance.                        - Return to my office as previously scheduled. Procedure Code(s):     --- Professional ---                        [id number], Colonoscopy, flexible; with removal of                         tumor(s), polyp(s), or other lesion(s) by snare                         technique                         45380, 23, Colonoscopy, flexible; with biopsy, single                         or multiple Diagnosis Code(s):     --- Professional ---                        Z12.11, Encounter for screening for malignant neoplasm                         of colon                        K63.5, Polyp of colon                        K62.89, Other specified diseases of anus and rectum                        K62.6, Ulcer of anus and rectum                        K64.4, Residual hemorrhoidal skin tags CPT copyright 2019 American Medical Association. All rights reserved. The codes documented in this report are preliminary and upon coder review may  be revised to meet current compliance requirements. Dr. Ulyess Mort Lin Landsman MD, MD 06/04/2021  10:24:16 AM This report has been signed electronically. Number of Addenda: 0 Note Initiated On: 06/04/2021 9:56 AM Scope Withdrawal Time: 0 hours 7 minutes 35 seconds  Total Procedure Duration: 0 hours 12 minutes 47 seconds  Estimated Blood Loss:  Estimated blood loss: none.      [facility]

## 2021-06-04 NOTE — H&P (Signed)
Cephas Darby, MD [address]  [place]  [place], [place] [address]  Main: [phone]  Fax: [phone] Pager: [phone]  Primary Care Physician:  Virginia Crews, MD Primary Gastroenterologist:  Dr. Cephas Darby  Pre-Procedure History & Physical: HPI:  Audrey Koch is a 50 y.o. female is here for an colonoscopy.   Past Medical History:  Diagnosis Date   Hyperlipidemia    Hypertension    Migraine     Past Surgical History:  Procedure Laterality Date   HEMORROIDECTOMY  10/08/2013    Prior to Admission medications   Medication Sig Start Date End Date Taking? Authorizing Provider  ibuprofen (ADVIL,MOTRIN) 600 MG tablet Take 1 tablet (600 mg total) by mouth every 6 (six) hours as needed. 01/28/16  Yes Triplett, Cari B, FNP  metoprolol succinate (TOPROL-XL) 25 MG 24 hr tablet TAKE 1 TABLET BY MOUTH DAILY 03/09/21 03/09/22 Yes Bacigalupo, Dionne Bucy, MD  Multiple Vitamins-Minerals (VITAMIN D3 COMPLETE PO) Take by mouth.   Yes [provider]  ALPRAZolam (XANAX) 0.5 MG tablet Take 1 tablet (0.5 mg total) by mouth at bedtime as needed for sleep. 05/08/21   Dohmeier, Asencion Partridge, MD  Lidocaine-Hydrocortisone Ace 2.8-0.55 % GEL Use rectally BID prn for hemorrhoid irritation 03/09/21   Bacigalupo, Dionne Bucy, MD  mometasone (NASONEX) 50 MCG/ACT nasal spray Place 2 sprays into the nose daily. Patient not taking: Reported on 06/04/2021 05/08/21   Dohmeier, Asencion Partridge, MD    Allergies as of 05/07/2021 - Review Complete 05/07/2021  Allergen Reaction Noted   Sulfa antibiotics Rash 09/07/2013    Family History  Problem Relation Age of Onset   Heart attack Mother    Hyperlipidemia Mother    Dementia Maternal Grandmother    Diabetes Maternal Grandfather    Dementia Paternal Grandmother    Diabetes Paternal Grandfather    Esophageal cancer Paternal Grandfather    Diabetes Brother    Breast cancer Neg Hx     Social History   Socioeconomic History   Marital status:  Married    Spouse name: bobby   Number of children: 2   Years of education: Not on file   Highest education level: Master's degree (e.g., MA, MS, MEng, MEd, MSW, MBA)  Occupational History   Not on file  Tobacco Use   Smoking status: Never   Smokeless tobacco: Never  Vaping Use   Vaping Use: Never used  Substance and Sexual Activity   Alcohol use: No   Drug use: No   Sexual activity: Not on file  Other Topics Concern   Not on file  Social History Narrative   Live with husband and daughter Lauren   Right handed   Caffeine: 12oz of caffeine a day   Social Determinants of Health   Financial Resource Strain: Not on file  Food Insecurity: Not on file  Transportation Needs: Not on file  Physical Activity: Not on file  Stress: Not on file  Social Connections: Not on file  Intimate Partner Violence: Not on file    Review of Systems: See HPI, otherwise negative ROS  Physical Exam: BP (!) 147/93   Pulse 85   Temp (!) 97.4 F (36.3 C) (Temporal)   Resp 20   Ht 5\' 2"  (1.575 m)   Wt 88.5 kg   SpO2 96%   BMI 35.67 kg/m  General:   Alert,  pleasant and cooperative in NAD Head:  Normocephalic and atraumatic. Neck:  Supple; no masses or thyromegaly. Lungs:  Clear  throughout to auscultation.    Heart:  Regular rate and rhythm. Abdomen:  Soft, nontender and nondistended. Normal bowel sounds, without guarding, and without rebound.   Neurologic:  Alert and  oriented x4;  grossly normal neurologically.  Impression/Plan: Audrey Koch is here for an colonoscopy to be performed for colon cancer screening  Risks, benefits, limitations, and alternatives regarding  colonoscopy have been reviewed with the patient.  Questions have been answered.  All parties agreeable.   Sherri Sear, MD  06/04/2021, 9:53 AM

## 2021-06-05 ENCOUNTER — Encounter: Payer: Self-pay | Admitting: Gastroenterology

## 2021-06-05 LAB — SURGICAL PATHOLOGY

## 2021-06-05 NOTE — Anesthesia Postprocedure Evaluation (Signed)
Anesthesia Post Note  Patient: Silvie T Beauregard  Procedure(s) Performed: COLONOSCOPY WITH PROPOFOL  Patient location during evaluation: PACU Anesthesia Type: General Level of consciousness: awake and alert Pain management: pain level controlled Vital Signs Assessment: post-procedure vital signs reviewed and stable Respiratory status: spontaneous breathing, nonlabored ventilation, respiratory function stable and patient connected to nasal cannula oxygen Cardiovascular status: blood pressure returned to baseline and stable Postop Assessment: no apparent nausea or vomiting Anesthetic complications: no   No notable events documented.   Last Vitals:  Vitals:   06/04/21 1046 06/04/21 1056  BP: 129/87 (!) 141/96  Pulse: 72 71  Resp: (!) 22 17  Temp:    SpO2: 96% 100%    Last Pain:  Vitals:   06/05/21 0738  TempSrc:   PainSc: 0-No pain                 Molli Barrows

## 2021-06-07 ENCOUNTER — Encounter: Payer: Self-pay | Admitting: Gastroenterology

## 2021-06-07 ENCOUNTER — Ambulatory Visit (INDEPENDENT_AMBULATORY_CARE_PROVIDER_SITE_OTHER): Payer: 59 | Admitting: Gastroenterology

## 2021-06-07 VITALS — BP 150/95 | HR 76 | Temp 97.8°F | Ht 62.0 in | Wt 199.6 lb

## 2021-06-07 DIAGNOSIS — L918 Other hypertrophic disorders of the skin: Secondary | ICD-10-CM

## 2021-06-07 DIAGNOSIS — K640 First degree hemorrhoids: Secondary | ICD-10-CM

## 2021-06-07 NOTE — Addendum Note (Signed)
Addended by: Eliseo Squires on: 06/07/2021 02:53 PM   Modules accepted: Orders

## 2021-06-07 NOTE — Progress Notes (Signed)
PROCEDURE NOTE: The patient presents with symptomatic grade 1 hemorrhoids, unresponsive to maximal medical therapy, requesting rubber band ligation of his/her hemorrhoidal disease.  All risks, benefits and alternative forms of therapy were described and informed consent was obtained.  The decision was made to band the LL internal hemorrhoid, and the [place] was used to perform band ligation without complication.  Digital anorectal examination was then performed to assure proper positioning of the band, and to adjust the banded tissue as required.  The patient was discharged home without pain or other issues.  Dietary and behavioral recommendations were given and (if necessary - prescriptions were given), along with follow-up instructions.  The patient will return 2 weeks for follow-up and possible additional banding as required.  No complications were encountered and the patient tolerated the procedure well.  Referral to general surgery for removal of perianal skin tag

## 2021-06-07 NOTE — Progress Notes (Signed)
Sent in referral to general surgery for perinal skin tags

## 2021-06-11 ENCOUNTER — Ambulatory Visit (INDEPENDENT_AMBULATORY_CARE_PROVIDER_SITE_OTHER): Payer: 59 | Admitting: Neurology

## 2021-06-11 DIAGNOSIS — G4733 Obstructive sleep apnea (adult) (pediatric): Secondary | ICD-10-CM

## 2021-06-11 DIAGNOSIS — G4719 Other hypersomnia: Secondary | ICD-10-CM

## 2021-06-11 DIAGNOSIS — G43109 Migraine with aura, not intractable, without status migrainosus: Secondary | ICD-10-CM

## 2021-06-11 DIAGNOSIS — M2619 Other specified anomalies of jaw-cranial base relationship: Secondary | ICD-10-CM

## 2021-06-11 DIAGNOSIS — R5382 Chronic fatigue, unspecified: Secondary | ICD-10-CM

## 2021-06-11 DIAGNOSIS — E669 Obesity, unspecified: Secondary | ICD-10-CM

## 2021-06-11 DIAGNOSIS — F419 Anxiety disorder, unspecified: Secondary | ICD-10-CM

## 2021-06-11 DIAGNOSIS — R0683 Snoring: Secondary | ICD-10-CM

## 2021-06-14 NOTE — Progress Notes (Signed)
[facility] TEST REPORT ( by Watch PAT)   STUDY DATA LOAD:  06-14-2021   ORDERING CLINICIAN: Larey Seat, MD  REFERRING CLINICIAN: Raynald Kemp   CLINICAL INFORMATION/HISTORY: 05-08-2021, Internal referral for snoring. Prior sleep study in 2020 in [place], [place], but only slept 47 min. Reports snoring has gotten worse. Averaging about 6-7 hours of sleep. C/o of fatigue and daytime sleepiness. 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 She has retrognathia.  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 around her menstrual cycle, which is regular, now migraine is present 2 times a month. Visual auras, nausea and vomiting in her twenties. She recalls Motion sickness and ice cream headaches.  Epworth sleepiness score: 11 /24.   BMI:  36.1 kg/m   Neck Circumference: 16"   Sleep Summary:   The total recording time for this home sleep test amounted to 8 hours and 4 hours and 46 minutes of which the calculated sleep time for 7 hours and 45 minutes.  The percentage of REM sleep was 20.8%.    Respiratory Indices:   Calculated pAHI (per hour): The overall calculated apnea-hypopnea index was 14.1 and is significant REM sleep dependency was noted at 29.7 AHI versus non-REM sleep of 9.9/h AHI.    In supine sleep the AHI was 15.7 and in nonsupine 9.7/h.  Snoring was unusually loud with a mean volume of 52 dB accompanied 90.2% of the total recorded sleep time.  Volume over 60 dB accompanied 15.6% of total sleep time.                                                                             Oxygen Saturation Statistics:       O2 Saturation Range (%): Oxygen saturation varied between a nadir at 85% and a maximum of 98% with a mean oxygenation of 93%.                                   O2 Saturation (minutes) <89%: Hypoxia amounted to  1.6 minutes only the equivalent of 0.4% total sleep time.          Pulse Rate Statistics:   Pulse Range varied between 53 and 105 bpm with a mean heart rate of 74 bpm.  Please note that a home sleep test cannot give indication of cardiac rhythm only rate.                 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  The volume of snoring was a surprise as it is very loud.   RECOMMENDATION: I would recommend positive airway pressure therapy to treat both snoring, REM dependent apnea and due to altered technology we can also have a CPAP automatically adjusting to the changing needs by different sleep positions.  I would recommend to use an autotitrator between 6 and 16 cmH2O pressure, 3 cm EPR, heated humidification for machine  and tubing.  The patient also should be able to choose her interface in reclined position and be fitted.  RV in the sleep clinic after 30-90 days on CPAP therapy.    INTERPRETING PHYSICIAN:   Larey Seat, MD   Medical Director of [facility] at [facility].

## 2021-06-18 NOTE — Progress Notes (Signed)
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  The volume of snoring was a surprise as it is very loud.  RECOMMENDATION: I would recommend positive airway pressure therapy to treat both snoring, REM dependent apnea and due to altered technology we can also have a CPAP automatically adjusting to the changing needs by different sleep positions.  I would recommend to use an autotitrator between 6 and 16 cmH2O pressure, 3 cm EPR, heated humidification for machine and tubing.  The patient also should be able to choose her interface in reclined position and be fitted.  RV in the sleep clinic after 30-90 days on CPAP therapy.   PS: If the patient is concerned about CPAP therapy, we can discuss these results first. Audrey Koch has retrognathia but a  dental device would likely only reduce the apnea ( REM sleep apnea doesn't respond well to dental device or Inspire).   INTERPRETING PHYSICIAN:

## 2021-06-18 NOTE — Procedures (Signed)
[facility] [place] TEST REPORT ( by Watch PAT)   STUDY DATA LOAD:  06-14-2021   ORDERING CLINICIAN: Larey Seat, MD  REFERRING CLINICIAN: Raynald Kemp   CLINICAL INFORMATION/HISTORY: 05-08-2021, Internal referral for snoring. Prior sleep study in 2020 in [place], [place], but only slept 47 min. Reports snoring has gotten worse. Averaging about 6-7 hours of sleep. C/o of fatigue and daytime sleepiness. 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 She has retrognathia.  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 around her menstrual cycle, which is regular, now migraine is present 2 times a month. Visual auras, nausea and vomiting in her twenties. She recalls Motion sickness and ice cream headaches.  Epworth sleepiness score: 11 /24.   BMI:  36.1 kg/m   Neck Circumference: 16"   Sleep Summary:   The total recording time for this home sleep test amounted to 8 hours and 4 hours and 46 minutes of which the calculated sleep time for 7 hours and 45 minutes.  The percentage of REM sleep was 20.8%.    Respiratory Indices:   Calculated pAHI (per hour): The overall calculated apnea-hypopnea index was 14.1 and is significant REM sleep dependency was noted at 29.7 AHI versus non-REM sleep of 9.9/h AHI.    In supine sleep the AHI was 15.7 and in nonsupine 9.7/h.  Snoring was unusually loud with a mean volume of 52 dB accompanied 90.2% of the total recorded sleep time.  Volume over 60 dB accompanied 15.6% of total sleep time.                                                                             Oxygen Saturation Statistics:       O2 Saturation Range (%): Oxygen saturation varied between a nadir at 85% and a maximum of 98% with a mean oxygenation of 93%.                                   O2 Saturation (minutes) <89%: Hypoxia amounted to 1.6 minutes  only the equivalent of 0.4% total sleep time.          Pulse Rate Statistics:   Pulse Range varied between 53 and 105 bpm with a mean heart rate of 74 bpm.  Please note that a home sleep test cannot give indication of cardiac rhythm only rate.                 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  The volume of snoring was a surprise as it is very loud.   RECOMMENDATION: I would recommend positive airway pressure therapy to treat both snoring, REM dependent apnea and due to altered technology we can also have a CPAP automatically adjusting to the changing needs by different sleep positions.  I would recommend to use an autotitrator between 6 and 16 cmH2O pressure, 3 cm EPR, heated humidification for machine and tubing.  The  patient also should be able to choose her interface in reclined position and be fitted.  RV in the sleep clinic after 30-90 days on CPAP therapy.    INTERPRETING PHYSICIAN:  Larey Seat, MD   Medical Director of [facility] at [facility]

## 2021-06-19 ENCOUNTER — Encounter: Payer: Self-pay | Admitting: Neurology

## 2021-06-19 NOTE — Telephone Encounter (Signed)
Called the pt to review the sleep study results in more detail and reviewed the information with her. Was able to answer questions that the patient had and advised of CPAP compliance information. Scheduled 09/24/21 with Dr Dohmeier for initial cpap follow up. .unq Pt had no questions at this time but was encouraged to call back if questions arise.

## 2021-06-25 ENCOUNTER — Ambulatory Visit: Payer: 59 | Admitting: Surgery

## 2021-06-28 ENCOUNTER — Encounter: Payer: Self-pay | Admitting: Gastroenterology

## 2021-06-28 ENCOUNTER — Ambulatory Visit (INDEPENDENT_AMBULATORY_CARE_PROVIDER_SITE_OTHER): Payer: 59 | Admitting: Gastroenterology

## 2021-06-28 VITALS — BP 149/88 | HR 80 | Temp 98.8°F | Ht 62.0 in | Wt 198.6 lb

## 2021-06-28 DIAGNOSIS — K640 First degree hemorrhoids: Secondary | ICD-10-CM

## 2021-06-28 NOTE — Progress Notes (Signed)
PROCEDURE NOTE: The patient presents with symptomatic grade 1 hemorrhoids, unresponsive to maximal medical therapy, requesting rubber band ligation of his/her hemorrhoidal disease.  All risks, benefits and alternative forms of therapy were described and informed consent was obtained.  The decision was made to band the RA internal hemorrhoid, and the [place] was used to perform band ligation without complication.  Digital anorectal examination was then performed to assure proper positioning of the band, and to adjust the banded tissue as required.  The patient was discharged home without pain or other issues.  Dietary and behavioral recommendations were given and (if necessary - prescriptions were given), along with follow-up instructions.  The patient will return as needed for follow-up and possible additional banding as required.  No complications were encountered and the patient tolerated the procedure well.  Referral to general surgery for removal of perianal skin tags

## 2021-07-02 ENCOUNTER — Ambulatory Visit: Payer: 59 | Admitting: Surgery

## 2021-07-04 ENCOUNTER — Encounter: Payer: Self-pay | Admitting: *Deleted

## 2021-07-04 NOTE — Progress Notes (Signed)
Complete physical exam   Patient: Audrey Koch   DOB: August 09, 1970   50 y.o. Female  MRN: [id number] Visit Date: 07/05/2021  Today's healthcare provider: Lavon Paganini, MD   Chief Complaint  Patient presents with   Annual Exam    Subjective    Audrey Koch is a 50 y.o. female who presents today for a complete physical exam.  She reports consuming a general diet. The patient does not participate in regular exercise at present. She generally feels well. She reports sleeping well. She does not have additional problems to discuss today.  HPI  Pap:11/08/19-Repeat in 5 years(2026) Colonoscopy:06/04/2021 repeat in 7 years Mammogram:03/21/21-Normal  Past Medical History:  Diagnosis Date   Hyperlipidemia    Hypertension    Migraine    Past Surgical History:  Procedure Laterality Date   COLONOSCOPY WITH PROPOFOL N/A 06/04/2021   Procedure: COLONOSCOPY WITH PROPOFOL;  Surgeon: Lin Landsman, MD;  Location: [facility] ENDOSCOPY;  Service: Gastroenterology;  Laterality: N/A;   HEMORROIDECTOMY  10/08/2013   Social History   Socioeconomic History   Marital status: Married    Spouse name: bobby   Number of children: 2   Years of education: Not on file   Highest education level: Master's degree (e.g., MA, MS, MEng, MEd, MSW, MBA)  Occupational History   Not on file  Tobacco Use   Smoking status: Never   Smokeless tobacco: Never  Vaping Use   Vaping Use: Never used  Substance and Sexual Activity   Alcohol use: No   Drug use: No   Sexual activity: Not on file  Other Topics Concern   Not on file  Social History Narrative   Live with husband and daughter Lauren   Right handed   Caffeine: 12oz of caffeine a day   Social Determinants of Health   Financial Resource Strain: Not on file  Food Insecurity: Not on file  Transportation Needs: Not on file  Physical Activity: Not on file  Stress: Not on file  Social Connections: Not on file  Intimate Partner Violence: Not on  file   Family Status  Relation Name Status   Mother  Alive   Father  Deceased   Brother 1 Alive   MGM  Alive   MGF  Alive   [place]  Alive   PGF  Alive   Brother 2 Alive   Neg Hx  (Not Specified)   Family History  Problem Relation Age of Onset   Heart attack Mother    Hyperlipidemia Mother    Dementia Maternal Grandmother    Diabetes Maternal Grandfather    Dementia Paternal Grandmother    Diabetes Paternal Grandfather    Esophageal cancer Paternal Grandfather    Diabetes Brother    Breast cancer Neg Hx    Allergies  Allergen Reactions   Phentermine Hcl Other (See Comments)   Spironolactone Other (See Comments)   Sulfa Antibiotics Rash    Patient Care Team: Mikey Kirschner, PA-C as PCP - General (Physician Assistant) Margarita Rana, MD as Referring Physician (Family Medicine) Christene Lye, MD (General Surgery)   Medications: Outpatient Medications Prior to Visit  Medication Sig   ALPRAZolam (XANAX) 0.5 MG tablet Take 1 tablet (0.5 mg total) by mouth at bedtime as needed for sleep.   Cholecalciferol (VITAMIN D3) 250 MCG (10000 UT) capsule 1 capsule   ibuprofen (ADVIL) 400 MG tablet 1 tablet with food or milk as needed   Lidocaine-Hydrocortisone Ace 2.8-0.55 % GEL Use  rectally BID prn for hemorrhoid irritation   metoprolol succinate (TOPROL-XL) 25 MG 24 hr tablet TAKE 1 TABLET BY MOUTH DAILY   mometasone (NASONEX) 50 MCG/ACT nasal spray Place 2 sprays into the nose daily.   Multiple Vitamins-Minerals (VITAMIN D3 COMPLETE PO) Take by mouth.   No facility-administered medications prior to visit.    Review of Systems  Constitutional:  Positive for diaphoresis.  HENT: Negative.    Eyes:  Positive for itching.  Respiratory: Negative.    Cardiovascular:  Positive for leg swelling.  Gastrointestinal: Negative.   Endocrine: Negative.   Genitourinary: Negative.   Musculoskeletal: Negative.   Skin: Negative.   Allergic/Immunologic: Negative.   Neurological:   Positive for headaches.  Hematological: Negative.   Psychiatric/Behavioral: Negative.     Last CBC Lab Results  Component Value Date   WBC 7.2 11/10/2019   HGB 13.2 11/10/2019   HCT 39.9 11/10/2019   MCV 87 11/10/2019   MCH 28.6 11/10/2019   RDW 13.2 11/10/2019   PLT 331 32/20/2542   Last metabolic panel Lab Results  Component Value Date   GLUCOSE 97 11/10/2019   NA 138 11/10/2019   K 4.2 11/10/2019   CL 103 11/10/2019   CO2 22 11/10/2019   BUN 13 11/10/2019   CREATININE 0.94 11/10/2019   GFRNONAA 71 11/10/2019   CALCIUM 8.8 11/10/2019   PROT 6.4 11/10/2019   ALBUMIN 4.0 11/10/2019   LABGLOB 2.4 11/10/2019   AGRATIO 1.7 11/10/2019   BILITOT 0.4 11/10/2019   ALKPHOS 85 11/10/2019   AST 13 11/10/2019   ALT 18 11/10/2019   ANIONGAP 10 06/16/2018   Last lipids Lab Results  Component Value Date   CHOL 205 (H) 11/10/2019   HDL 47 11/10/2019   LDLCALC 143 (H) 11/10/2019   TRIG 84 11/10/2019   CHOLHDL 4.4 11/10/2019   Last hemoglobin A1c Lab Results  Component Value Date   HGBA1C 5.5 11/10/2019   Last thyroid functions Lab Results  Component Value Date   TSH 3.400 11/10/2019   T4TOTAL 7.9 07/19/2016      Objective    BP 126/89 (BP Location: Right Arm, Patient Position: Sitting, Cuff Size: Large)   Pulse 81   Temp 97.6 F (36.4 C) (Temporal)   Resp 16   Wt 200 lb 14.4 oz (91.1 kg)   SpO2 99%   BMI 36.75 kg/m  BP Readings from Last 3 Encounters:  07/05/21 126/89  06/28/21 (!) 149/88  06/07/21 (!) 150/95   Wt Readings from Last 3 Encounters:  07/05/21 200 lb 14.4 oz (91.1 kg)  06/28/21 198 lb 9.6 oz (90.1 kg)  06/07/21 199 lb 9.6 oz (90.5 kg)      Physical Exam Vitals reviewed.  Constitutional:      General: She is not in acute distress.    Appearance: Normal appearance. She is well-developed. She is not diaphoretic.  HENT:     Head: Normocephalic and atraumatic.     Right Ear: Tympanic membrane, ear canal and external ear normal.     Left  Ear: Tympanic membrane, ear canal and external ear normal.     Nose: Nose normal.     Mouth/Throat:     Mouth: Mucous membranes are moist.     Pharynx: Oropharynx is clear. No oropharyngeal exudate.  Eyes:     General: No scleral icterus.    Conjunctiva/sclera: Conjunctivae normal.     Pupils: Pupils are equal, round, and reactive to light.  Neck:     Thyroid: No  thyromegaly.  Cardiovascular:     Rate and Rhythm: Normal rate and regular rhythm.     Pulses: Normal pulses.     Heart sounds: Normal heart sounds. No murmur heard. Pulmonary:     Effort: Pulmonary effort is normal. No respiratory distress.     Breath sounds: Normal breath sounds. No wheezing or rales.  Abdominal:     General: There is no distension.     Palpations: Abdomen is soft.     Tenderness: There is no abdominal tenderness.  Musculoskeletal:        General: No deformity.     Cervical back: Neck supple.     Right lower leg: No edema.     Left lower leg: No edema.  Lymphadenopathy:     Cervical: No cervical adenopathy.  Skin:    General: Skin is warm and dry.     Findings: No rash.  Neurological:     Mental Status: She is alert and oriented to person, place, and time. Mental status is at baseline.     Sensory: No sensory deficit.     Motor: No weakness.     Gait: Gait normal.  Psychiatric:        Mood and Affect: Mood normal.        Behavior: Behavior normal.        Thought Content: Thought content normal.      Last depression screening scores PHQ 2/9 Scores 07/05/2021 03/09/2021 11/08/2019  PHQ - 2 Score 0 0 0  PHQ- 9 Score 0 4 1  Some encounter information is confidential and restricted. Go to Review Flowsheets activity to see all data.   Last fall risk screening Fall Risk  07/05/2021  Falls in the past year? 0  Number falls in past yr: 0  Injury with Fall? 0  Risk for fall due to : No Fall Risks  Follow up Falls evaluation completed   Last Audit-C alcohol use screening Alcohol Use Disorder  Test (AUDIT) 07/05/2021  1. How often do you have a drink containing alcohol? 0  2. How many drinks containing alcohol do you have on a typical day when you are drinking? 0  3. How often do you have six or more drinks on one occasion? 0  AUDIT-C Score 0  Some encounter information is confidential and restricted. Go to Review Flowsheets activity to see all data.   A score of 3 or more in women, and 4 or more in men indicates increased risk for alcohol abuse, EXCEPT if all of the points are from question 1   No results found for any visits on 07/05/21.  Assessment & Plan    Routine Health Maintenance and Physical Exam  Exercise Activities and Dietary recommendations  Goals   None     Immunization History  Administered Date(s) Administered   DTaP 02/12/1974, 03/11/1988, 10/31/1998   Hepatitis B 05/16/1992, 06/30/1992, 11/17/1992   Influenza Whole 05/06/2017   Influenza-Unspecified 04/08/2018, 04/28/2019, 03/29/2021   PFIZER(Purple Top)SARS-COV-2 Vaccination 07/26/2019, 08/16/2019, 08/24/2019    Health Maintenance  Topic Date Due   Pneumococcal Vaccine 28-77 Years old (1 - PCV) Never done   Hepatitis C Screening  Never done   TETANUS/TDAP  Never done   Zoster Vaccines- Shingrix (1 of 2) Never done   COVID-19 Vaccine (4 - Booster) 10/19/2019   MAMMOGRAM  03/22/2023   PAP SMEAR-Modifier  11/07/2024   COLONOSCOPY (Pts 45-56yrs Insurance coverage will need to be confirmed)  06/04/2028   INFLUENZA VACCINE  Completed   HIV Screening  Completed   HPV VACCINES  Aged Out    Discussed health benefits of physical activity, and encouraged her to engage in regular exercise appropriate for her age and condition.  Problem List Items Addressed This Visit       Cardiovascular and Mediastinum   Essential hypertension    Well controlled Continue current medications Recheck metabolic panel F/u in 6 months       Relevant Orders   Comprehensive metabolic panel     Other    Avitaminosis D   Relevant Orders   VITAMIN D 25 Hydroxy (Vit-D Deficiency, Fractures)   Obesity    Discussed importance of healthy weight management Discussed diet and exercise       Other Visit Diagnoses     Annual physical exam    -  Primary   Relevant Orders   Comprehensive metabolic panel   Lipid panel   Encounter for hepatitis C screening test for low risk patient       Relevant Orders   Hepatitis C antibody   Screening for HIV (human immunodeficiency virus)       Relevant Orders   HIV Antibody (routine testing w rflx)   Need for Tdap vaccination       Relevant Orders   Tdap vaccine greater than or equal to 7yo IM   Hyperglycemia       Relevant Orders   Hemoglobin A1c        Return in about 1 year (around 07/05/2022) for CPE, With new PCP.     I, Lavon Paganini, MD, have reviewed all documentation for this visit. The documentation on 07/05/21 for the exam, diagnosis, procedures, and orders are all accurate and complete.   Adela Esteban, Dionne Bucy, MD, MPH [facility]

## 2021-07-05 ENCOUNTER — Ambulatory Visit (INDEPENDENT_AMBULATORY_CARE_PROVIDER_SITE_OTHER): Payer: 59 | Admitting: Family Medicine

## 2021-07-05 ENCOUNTER — Encounter: Payer: Self-pay | Admitting: Family Medicine

## 2021-07-05 ENCOUNTER — Other Ambulatory Visit: Payer: Self-pay

## 2021-07-05 VITALS — BP 126/89 | HR 81 | Temp 97.6°F | Resp 16 | Wt 200.9 lb

## 2021-07-05 DIAGNOSIS — Z114 Encounter for screening for human immunodeficiency virus [HIV]: Secondary | ICD-10-CM

## 2021-07-05 DIAGNOSIS — Z6836 Body mass index (BMI) 36.0-36.9, adult: Secondary | ICD-10-CM | POA: Diagnosis not present

## 2021-07-05 DIAGNOSIS — E669 Obesity, unspecified: Secondary | ICD-10-CM

## 2021-07-05 DIAGNOSIS — I10 Essential (primary) hypertension: Secondary | ICD-10-CM | POA: Diagnosis not present

## 2021-07-05 DIAGNOSIS — R739 Hyperglycemia, unspecified: Secondary | ICD-10-CM

## 2021-07-05 DIAGNOSIS — E559 Vitamin D deficiency, unspecified: Secondary | ICD-10-CM | POA: Diagnosis not present

## 2021-07-05 DIAGNOSIS — Z1159 Encounter for screening for other viral diseases: Secondary | ICD-10-CM | POA: Diagnosis not present

## 2021-07-05 DIAGNOSIS — Z0000 Encounter for general adult medical examination without abnormal findings: Secondary | ICD-10-CM | POA: Diagnosis not present

## 2021-07-05 DIAGNOSIS — Z23 Encounter for immunization: Secondary | ICD-10-CM

## 2021-07-05 NOTE — Assessment & Plan Note (Signed)
Well controlled Continue current medications Recheck metabolic panel F/u in 6 months  

## 2021-07-05 NOTE — Assessment & Plan Note (Signed)
Discussed importance of healthy weight management Discussed diet and exercise  

## 2021-07-05 NOTE — Patient Instructions (Signed)
   The CDC recommends two doses of Shingrix (the shingles vaccine) separated by 2 to 6 months for adults age 50 years and older. I recommend checking with your insurance plan regarding coverage for this vaccine.   

## 2021-07-06 DIAGNOSIS — Z1159 Encounter for screening for other viral diseases: Secondary | ICD-10-CM | POA: Diagnosis not present

## 2021-07-06 DIAGNOSIS — Z0000 Encounter for general adult medical examination without abnormal findings: Secondary | ICD-10-CM | POA: Diagnosis not present

## 2021-07-06 DIAGNOSIS — R739 Hyperglycemia, unspecified: Secondary | ICD-10-CM | POA: Diagnosis not present

## 2021-07-06 DIAGNOSIS — I10 Essential (primary) hypertension: Secondary | ICD-10-CM | POA: Diagnosis not present

## 2021-07-06 DIAGNOSIS — E559 Vitamin D deficiency, unspecified: Secondary | ICD-10-CM | POA: Diagnosis not present

## 2021-07-06 DIAGNOSIS — Z114 Encounter for screening for human immunodeficiency virus [HIV]: Secondary | ICD-10-CM | POA: Diagnosis not present

## 2021-07-07 LAB — HIV ANTIBODY (ROUTINE TESTING W REFLEX): HIV Screen 4th Generation wRfx: NONREACTIVE

## 2021-07-07 LAB — HEMOGLOBIN A1C
Est. average glucose Bld gHb Est-mCnc: 120 mg/dL
Hgb A1c MFr Bld: 5.8 % — ABNORMAL HIGH (ref 4.8–5.6)

## 2021-07-07 LAB — COMPREHENSIVE METABOLIC PANEL
ALT: 19 IU/L (ref 0–32)
AST: 17 IU/L (ref 0–40)
Albumin/Globulin Ratio: 1.7 (ref 1.2–2.2)
Albumin: 4.1 g/dL (ref 3.8–4.9)
Alkaline Phosphatase: 89 IU/L (ref 44–121)
BUN/Creatinine Ratio: 12 (ref 9–23)
BUN: 11 mg/dL (ref 6–24)
Bilirubin Total: 0.5 mg/dL (ref 0.0–1.2)
CO2: 22 mmol/L (ref 20–29)
Calcium: 8.8 mg/dL (ref 8.7–10.2)
Chloride: 102 mmol/L (ref 96–106)
Creatinine, Ser: 0.92 mg/dL (ref 0.57–1.00)
Globulin, Total: 2.4 g/dL (ref 1.5–4.5)
Glucose: 100 mg/dL — ABNORMAL HIGH (ref 70–99)
Potassium: 4.1 mmol/L (ref 3.5–5.2)
Sodium: 138 mmol/L (ref 134–144)
Total Protein: 6.5 g/dL (ref 6.0–8.5)
eGFR: 75 mL/min/{1.73_m2} (ref >59)

## 2021-07-07 LAB — LIPID PANEL
Chol/HDL Ratio: 5.3 ratio — ABNORMAL HIGH (ref 0.0–4.4)
Cholesterol, Total: 207 mg/dL — ABNORMAL HIGH (ref 100–199)
HDL: 39 mg/dL — ABNORMAL LOW (ref >39)
LDL Chol Calc (NIH): 144 mg/dL — ABNORMAL HIGH (ref 0–99)
Triglycerides: 132 mg/dL (ref 0–149)
VLDL Cholesterol Cal: 24 mg/dL (ref 5–40)

## 2021-07-07 LAB — HEPATITIS C ANTIBODY: Hep C Virus Ab: 0.1 s/co ratio (ref 0.0–0.9)

## 2021-07-07 LAB — VITAMIN D 25 HYDROXY (VIT D DEFICIENCY, FRACTURES): Vit D, 25-Hydroxy: 34.5 ng/mL (ref 30.0–100.0)

## 2021-07-12 ENCOUNTER — Institutional Professional Consult (permissible substitution): Payer: 59 | Admitting: Neurology

## 2021-07-12 DIAGNOSIS — G4733 Obstructive sleep apnea (adult) (pediatric): Secondary | ICD-10-CM | POA: Diagnosis not present

## 2021-08-01 ENCOUNTER — Other Ambulatory Visit: Payer: Self-pay

## 2021-08-12 DIAGNOSIS — G4733 Obstructive sleep apnea (adult) (pediatric): Secondary | ICD-10-CM | POA: Diagnosis not present

## 2021-09-04 ENCOUNTER — Other Ambulatory Visit: Payer: Self-pay

## 2021-09-04 MED ORDER — FLUCONAZOLE 150 MG PO TABS
ORAL_TABLET | ORAL | 0 refills | Status: DC
  Filled 2021-09-04: qty 1, 1d supply, fill #0

## 2021-09-12 DIAGNOSIS — G4733 Obstructive sleep apnea (adult) (pediatric): Secondary | ICD-10-CM | POA: Diagnosis not present

## 2021-09-24 ENCOUNTER — Ambulatory Visit (INDEPENDENT_AMBULATORY_CARE_PROVIDER_SITE_OTHER): Payer: 59 | Admitting: Neurology

## 2021-09-24 ENCOUNTER — Encounter: Payer: Self-pay | Admitting: Neurology

## 2021-09-24 VITALS — BP 135/85 | HR 84 | Ht 62.0 in | Wt 201.0 lb

## 2021-09-24 DIAGNOSIS — G4733 Obstructive sleep apnea (adult) (pediatric): Secondary | ICD-10-CM | POA: Insufficient documentation

## 2021-09-24 DIAGNOSIS — G4719 Other hypersomnia: Secondary | ICD-10-CM | POA: Diagnosis not present

## 2021-09-24 DIAGNOSIS — Z9989 Dependence on other enabling machines and devices: Secondary | ICD-10-CM

## 2021-09-24 NOTE — Progress Notes (Signed)
SLEEP MEDICINE CLINIC    Provider:  Larey Seat, MD  Primary Care Physician:  Mikey Kirschner, PA-C [address] [place] [address]     Referring Provider: Virginia Crews, [place],  [place] [address]          Chief Complaint according to patient   Patient presents with:     New Patient (Initial Visit)           HISTORY OF PRESENT ILLNESS:  Audrey Koch is a 51 y.o. Caucasian female patient and seen on 09-24-2021; following after HST and CPAP initiation.  I had the pleasure of meeting today again with a meat loaded she underwent a home sleep test following her initial consultation on 05-08-2021.  The patient had endorsed the Epworth sleepiness score at 11 points the calculated AHI or apnea hypopnea index was 14.1/h but during REM sleep more than doubled to 29.7/h non-REM AHI was 9.9/h.  In supine sleep there was also a critical accentuation to 15.7/h.  Snoring was unusually loud the mean volume was measured at 52 dB.  And over 15.6% of the total sleep time there was even a volume over 60 dB. There was no significant hypoxia heart rate varied between 53 and 105 bpm and due to the REM dependent apnea and the elevated BMI we decided to recommend a CPAP titration.  The patient was set up for an autotitrator between 6 and 16 cmH2O pressure window 3 cm expiratory pressure relief with heated humidification for tubing and machine or mask to be fitted.  I have not the pleasure of seeing the compliance report and this patient has been 100% compliant she has used the machine every day of the last 30 days and each of those days over 4 hours with an average of 7 hours 50 minutes minimum pressure is as ordered 6 cmH2O maximum pressure is 16 cmH2O her residual AHI is 0.2/h this is an excellent resolution of apnea the 95th percentile pressure was 9.3 cmH2O so well within the current settings.  The settings have not to change neither does the mask which  does not allow for a lot of air leakage.  But what has been remarkable is a reduction in the Epworth Sleepiness Scale to 4 points when the pre-CPAP score was 11 points.  So there is a significant reduction in sleepiness and and fatigue.  She is going to the bathroom 1-2 times now, not 3-4 times.       Last time she was seen here as a referral by Dr. Rosanna Randy, MD,for a sleep consultation.  Chief concern according to patient :  05-08-2021, Internal referral for snoring. Prior sleep study in 2020 in [place], [place], , slept 57min. Pt tried to have another SS and was unable to get it done and has put it off til now. Reports snoring has gotten worse. Avg sleep about 6-7hr. C/o of fatigue and daytime sleepiness. 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    I have the pleasure of seeing Audrey Koch , a right-handed Caucasian female with a possible sleep disorder.  She  has a past medical history of Hyperlipidemia, Hypertension, and Migraine. Migraines around her menstrual cycle, regular , now 2 times a month. Visual auras, nausea and vomiting in her twenties. Motion sickness and ice cream headaches.    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 Family medical Rachelle Hora  history: father passed away at age 2 with OSA. Social history:  Patient is working as Secretary/administrator and lives in a household with spouse and daughter, Neurosurgeon and dog. Tobacco use: none .  ETOH use none , Caffeine intake in form of Coffee( /) Soda( 12 ounces a day) Tea ( /) or energy drinks. Regular exercise: none .   Hobbies : hiking, paddle boarding, kayaking.       Sleep habits are as follows: The patient's dinner time is between 7 PM. The patient goes to bed at 10.30  Pm and reports no difficulties to initiate sleep but  wakes for 2-4 bathroom breaks. Bedroom is cool, quiet and dark, light locking curtain, no TV,  The preferred sleep position is sideways, prone-, with the support of 1  pillow, flat bed. Dreams are reportedly frequent.  7.15  AM is the usual rise time. The patient wakes up with an alarm at 7 AM.  She reports not feeling refreshed or restored in AM, with symptoms such as dry mouth, morning headaches, and residual fatigue. Naps are taken frequently, lasting from 45 to 60 minutes and are more refreshing than nocturnal sleep.    Review of Systems: Out of a complete 14 system review, the patient complains of only the following symptoms, and all other reviewed systems are negative.:  Fatigue, sleepiness , snoring, fragmented sleep due to nocturia.   Migraines in a cyclic pattern, now only 1-2  days a month.  Slightly reduced on CPAP.    How likely are you to doze in the following situations: 0 = not likely, 1 = slight chance, 2 = moderate chance, 3 = high chance   Sitting and Reading? Watching Television? Sitting inactive in a public place (theater or meeting)? As a passenger in a car for an hour without a break? Lying down in the afternoon when circumstances permit? Sitting and talking to someone? Sitting quietly after lunch without alcohol? In a car, while stopped for a few minutes in traffic?   Total = 4 from pro CPAP 11 / 24 points   FSS endorsed at 35 from 55/ 63 points.   Nocturia reduced to 1-2 times from 3-4 times.   Averages now 7 hours of sleep.   Social History   Socioeconomic History   Marital status: Married    Spouse name: bobby   Number of children: 2   Years of education: Not on file   Highest education level: Master's degree (e.g., MA, MS, MEng, MEd, MSW, MBA)  Occupational History   Not on file  Tobacco Use   Smoking status: Never   Smokeless tobacco: Never  Vaping Use   Vaping Use: Never used  Substance and Sexual Activity   Alcohol use: No   Drug use: No   Sexual activity: Not on file  Other Topics Concern   Not on file  Social History Narrative   Live with husband and daughter Lauren   Right handed   Caffeine: 12oz  of caffeine a day   Social Determinants of Health   Financial Resource Strain: Not on file  Food Insecurity: Not on file  Transportation Needs: Not on file  Physical Activity: Not on file  Stress: Not on file  Social Connections: Not on file    Family History  Problem Relation Age of Onset   Heart attack Mother    Hyperlipidemia Mother    Dementia Maternal Grandmother    Diabetes Maternal Grandfather    Dementia Paternal Grandmother  Diabetes Paternal Grandfather    Esophageal cancer Paternal Grandfather    Diabetes Brother    Breast cancer Neg Hx     Past Medical History:  Diagnosis Date   Hyperlipidemia    Hypertension    Migraine     Past Surgical History:  Procedure Laterality Date   COLONOSCOPY WITH PROPOFOL N/A 06/04/2021   Procedure: COLONOSCOPY WITH PROPOFOL;  Surgeon: Lin Landsman, MD;  Location: [facility] ENDOSCOPY;  Service: Gastroenterology;  Laterality: N/A;   HEMORROIDECTOMY  10/08/2013     Current Outpatient Medications on File Prior to Visit  Medication Sig Dispense Refill   Cholecalciferol (VITAMIN D3) 250 MCG (10000 UT) capsule 1 capsule     ibuprofen (ADVIL) 400 MG tablet 1 tablet with food or milk as needed     Lidocaine-Hydrocortisone Ace 2.8-0.55 % GEL Use rectally BID prn for hemorrhoid irritation 100 g 0   metoprolol succinate (TOPROL-XL) 25 MG 24 hr tablet TAKE 1 TABLET BY MOUTH DAILY 90 tablet 1   mometasone (NASONEX) 50 MCG/ACT nasal spray Place 2 sprays into the nose daily. 1 each 12   Multiple Vitamins-Minerals (VITAMIN D3 COMPLETE PO) Take by mouth.     No current facility-administered medications on file prior to visit.    Allergies  Allergen Reactions   Phentermine Hcl Other (See Comments)   Spironolactone Other (See Comments)   Sulfa Antibiotics Rash    Physical exam:  Today's Vitals   09/24/21 1555  BP: 135/85  Pulse: 84  Weight: 201 lb (91.2 kg)  Height: 5\' 2"  (1.575 m)   Body mass index is 36.76 kg/m.   Wt  Readings from Last 3 Encounters:  09/24/21 201 lb (91.2 kg)  07/05/21 200 lb 14.4 oz (91.1 kg)  06/28/21 198 lb 9.6 oz (90.1 kg)     Ht Readings from Last 3 Encounters:  09/24/21 5\' 2"  (1.575 m)  06/28/21 5\' 2"  (1.575 m)  06/07/21 5\' 2"  (1.575 m)      General: The patient is awake, alert and appears not in acute distress. The patient is well groomed. Head: Normocephalic, atraumatic. Neck is supple. Mallampati 3, scalloped tongue, small lower jaw,  neck circumference:15.5  inches .  Large breasts.  Snoring in all positions.   Nasal airflow patent, but nasal septum is deviated to the left- .  Retrognathia is seen.  Dental status:  Cardiovascular:  Regular rate and cardiac rhythm by pulse,  without distended neck veins. Respiratory: Lungs are clear to auscultation.  Skin:  Without evidence of ankle edema, or rash. Trunk: The patient's posture is erect.   Neurologic exam : The patient is awake and alert, oriented to place and time.   Memory subjective described as intact.  Attention span & concentration ability appears normal.  Speech is fluent,  without  dysarthria, dysphonia or aphasia.  Mood and affect are appropriate.   Cranial nerves: no loss of smell or taste reported  Pupils are equal and briskly reactive to light. Funduscopic exam deferred.  Extraocular movements in vertical and horizontal planes were intact and without nystagmus. No Diplopia. Visual fields by finger perimetry are intact. Hearing was intact to soft voice and finger rubbing. Facial sensation intact to fine touch. Facial motor strength is symmetric and tongue and uvula move midline.  Neck ROM : rotation, tilt and flexion extension were normal for age and shoulder shrug was symmetrical.          After spending a total time of  25  minutes face  to face including additional time for physical and neurologic examination, review of laboratory studies,  personal review of imaging studies, reports and results of  other testing and review of referral information / records as far as provided in visit, I have established the following assessments:  1)  OSA was present:  now controlled on CPAP , well controlled and with clear clinical benefit.   With reduction in Epworth, FSS and nocturia and headaches.   Nasal pillow. Is comfortable.  Low air leakage , high compliance.   My Plan is to proceed with:  Using CPAP at current settings. Exercise regimen to be renewed. Higher protein diet.   I would like to thank Emelia Loron and Virginia Crews, [place],  [place] [address] for allowing me to meet with and to take care of this pleasant patient.   Sleep clinic CPAP follow up through our NP within 12 -14 month.    Electronically signed by: Larey Seat, MD 09/24/2021 4:27 PM  [facility] Board certified by The [employer] of Sleep Medicine and Diplomate of the [employer] of Sleep Medicine. Board certified In Neurology through the [place], Fellow of the [employer] of Neurology. Medical Director of [employer].

## 2021-09-24 NOTE — Patient Instructions (Signed)

## 2021-10-10 DIAGNOSIS — G4733 Obstructive sleep apnea (adult) (pediatric): Secondary | ICD-10-CM | POA: Diagnosis not present

## 2021-10-23 DIAGNOSIS — G4733 Obstructive sleep apnea (adult) (pediatric): Secondary | ICD-10-CM | POA: Diagnosis not present

## 2021-10-31 ENCOUNTER — Other Ambulatory Visit: Payer: Self-pay | Admitting: Family Medicine

## 2021-10-31 ENCOUNTER — Other Ambulatory Visit: Payer: Self-pay

## 2021-10-31 DIAGNOSIS — I10 Essential (primary) hypertension: Secondary | ICD-10-CM

## 2021-10-31 MED ORDER — METOPROLOL SUCCINATE ER 25 MG PO TB24
ORAL_TABLET | Freq: Every day | ORAL | 0 refills | Status: DC
  Filled 2021-10-31: qty 90, 90d supply, fill #0

## 2021-10-31 MED ORDER — OMRON 3 SERIES BP MONITOR DEVI
0 refills | Status: AC
  Filled 2021-10-31: qty 1, 1d supply, fill #0

## 2021-12-14 ENCOUNTER — Encounter: Payer: Self-pay | Admitting: Physician Assistant

## 2021-12-14 ENCOUNTER — Ambulatory Visit (INDEPENDENT_AMBULATORY_CARE_PROVIDER_SITE_OTHER): Payer: 59 | Admitting: Physician Assistant

## 2021-12-14 ENCOUNTER — Other Ambulatory Visit: Payer: Self-pay

## 2021-12-14 VITALS — BP 152/100 | HR 102 | Temp 98.4°F | Resp 16 | Ht 62.0 in | Wt 202.8 lb

## 2021-12-14 DIAGNOSIS — R7303 Prediabetes: Secondary | ICD-10-CM | POA: Diagnosis not present

## 2021-12-14 DIAGNOSIS — E782 Mixed hyperlipidemia: Secondary | ICD-10-CM | POA: Diagnosis not present

## 2021-12-14 DIAGNOSIS — N898 Other specified noninflammatory disorders of vagina: Secondary | ICD-10-CM | POA: Diagnosis not present

## 2021-12-14 DIAGNOSIS — I10 Essential (primary) hypertension: Secondary | ICD-10-CM

## 2021-12-14 DIAGNOSIS — Z6837 Body mass index (BMI) 37.0-37.9, adult: Secondary | ICD-10-CM | POA: Insufficient documentation

## 2021-12-14 DIAGNOSIS — Z113 Encounter for screening for infections with a predominantly sexual mode of transmission: Secondary | ICD-10-CM

## 2021-12-14 DIAGNOSIS — F419 Anxiety disorder, unspecified: Secondary | ICD-10-CM

## 2021-12-14 DIAGNOSIS — E669 Obesity, unspecified: Secondary | ICD-10-CM | POA: Insufficient documentation

## 2021-12-14 MED ORDER — WEGOVY 0.25 MG/0.5ML ~~LOC~~ SOAJ
0.2500 mg | SUBCUTANEOUS | 0 refills | Status: DC
  Filled 2021-12-14: qty 2, 28d supply, fill #0

## 2021-12-14 NOTE — Assessment & Plan Note (Signed)
Advised repeat fasting labs

## 2021-12-14 NOTE — Assessment & Plan Note (Signed)
Elevated in office today. Managed w/ Metoprolol 25 mg once daily. Advised pt to consistently check at home, if elevated > 130/90 to double metoprolol to 50 mg daily. May need additional ace-I or arb. Will follow

## 2021-12-14 NOTE — Assessment & Plan Note (Addendum)
Lengthy discussion over weight loss history.  Previously tried: Phentermine--increased BP CI: Other stimulants d/t elevated BP and underlying anxiety; contrave is CI d/t history of anxiety/depression  Previously seen nutritionists, adhered to different calorie restricted diets and regular exercise.  Pt would like to try wegovy -- discussed this vs metformin for underling pre-dm and insulin resistance. Pt would prefer to try wegovy-- explained MOA, SE, method of administration, likely will need PA.  F/u 3 mo

## 2021-12-14 NOTE — Assessment & Plan Note (Signed)
Nuswab obtained r/o candidia, will also check c/g/trich

## 2021-12-14 NOTE — Progress Notes (Signed)
Established patient visit   Patient: Audrey Koch   DOB: 03-17-1971   51 y.o. Female  MRN: [id number] Visit Date: 12/14/2021  Today's healthcare provider: Mikey Kirschner, PA-C   I,Tiffany J Bragg,acting as a scribe for Mikey Kirschner, PA-C.,have documented all relevant documentation on the behalf of Mikey Kirschner, PA-C,as directed by  Mikey Kirschner, PA-C while in the presence of Mikey Kirschner, PA-C.   Chief Complaint  Patient presents with   Diabetes   Weight Loss    Wants to discuss possible weight loss options.    Vaginal Itching    Patient complains of vaginal itching for 6 weeks.   Subjective    HPI  Weight Management -Pt has tried phentermine in the past and it raised her blood pressure; in general BP has been higher lately. -She has seen a nutritionist in the past for several visits. -Seems to be progressively gaining wait in perimenopause, now pre-diabetic and has struggled with insulin resistance before then -adheres to a calorie restricted diet and regular exercise. -Questions about wegovy  Vaginal itching/anal skin tags -Unsure if she has a year infection.   Did 'a cleanse' and reduce sugar to decrease candidia. Used OTC treatment. Denies vaginal rash.  Possible exposure to STI--thoughts that husband may be having other partners. Requests STI screening.  Has appt coming up for surgical consultation for skin tag removal.  Prediabetes, Follow-up  Lab Results  Component Value Date   HGBA1C 5.8 (H) 07/06/2021   HGBA1C 5.5 11/10/2019   HGBA1C 5.3 06/16/2018   GLUCOSE 100 (H) 07/06/2021   GLUCOSE 97 11/10/2019   GLUCOSE 106 (H) 06/16/2018    Last seen for for this6 months ago.  Management since that visit includes continue to monitor. Current symptoms include hypoglycemia  and have been worsening.  Prior visit with dietician: no Current diet: well balanced Current exercise: weightlifting and PT  Pertinent Labs:    Component Value Date/Time   CHOL  207 (H) 07/06/2021 0834   TRIG 132 07/06/2021 0834   CHOLHDL 5.3 (H) 07/06/2021 0834   CHOLHDL 4.3 06/16/2018 0835   CREATININE 0.92 07/06/2021 0834   CREATININE 0.75 12/10/2011 0922    Wt Readings from Last 3 Encounters:  12/14/21 202 lb 12.8 oz (92 kg)  09/24/21 201 lb (91.2 kg)  07/05/21 200 lb 14.4 oz (91.1 kg)    -----------------------------------------------------------------------------------------   ---------------------------------------------------------------------------------------------------   Medications: Outpatient Medications Prior to Visit  Medication Sig   Blood Pressure Monitoring (OMRON 3 SERIES BP MONITOR) DEVI use to check blood pressure   Cholecalciferol (VITAMIN D3) 250 MCG (10000 UT) capsule 1 capsule   ibuprofen (ADVIL) 400 MG tablet 1 tablet with food or milk as needed   Lidocaine-Hydrocortisone Ace 2.8-0.55 % GEL Use rectally BID prn for hemorrhoid irritation   metoprolol succinate (TOPROL-XL) 25 MG 24 hr tablet TAKE 1 TABLET BY MOUTH DAILY   mometasone (NASONEX) 50 MCG/ACT nasal spray Place 2 sprays into the nose daily.   Multiple Vitamins-Minerals (VITAMIN D3 COMPLETE PO) Take by mouth.   No facility-administered medications prior to visit.    Review of Systems  Constitutional:  Positive for fatigue and unexpected weight change. Negative for fever.  Respiratory:  Negative for cough and shortness of breath.   Cardiovascular:  Negative for chest pain and leg swelling.  Gastrointestinal:  Negative for abdominal pain.  Neurological:  Negative for dizziness and headaches.      Objective    BP (!) 152/100   Pulse Marland Kitchen)  102   Temp 98.4 F (36.9 C) (Oral)   Resp 16   Ht '5\' 2"'$  (1.575 m)   Wt 202 lb 12.8 oz (92 kg)   SpO2 100%   BMI 37.09 kg/m   Physical Exam Vitals reviewed.  Constitutional:      Appearance: She is not ill-appearing.  HENT:     Head: Normocephalic.  Eyes:     Conjunctiva/sclera: Conjunctivae normal.  Cardiovascular:      Rate and Rhythm: Normal rate.  Pulmonary:     Effort: Pulmonary effort is normal. No respiratory distress.  Genitourinary:    Labia:        Right: No rash or lesion.        Left: No rash or lesion.      Comments: Multiple large external skin tags along rectum w/ associated excoriation marks and erythema.  Neurological:     General: No focal deficit present.     Mental Status: She is alert and oriented to person, place, and time.  Psychiatric:        Mood and Affect: Mood normal.        Behavior: Behavior normal.     No results found for any visits on 12/14/21.  Assessment & Plan     Problem List Items Addressed This Visit       Cardiovascular and Mediastinum   Essential hypertension - Primary    Elevated in office today. Managed w/ Metoprolol 25 mg once daily. Advised pt to consistently check at home, if elevated > 130/90 to double metoprolol to 50 mg daily. May need additional ace-I or arb. Will follow       Relevant Orders   Comprehensive Metabolic Panel (CMET)     Genitourinary   Vaginal itching    Nuswab obtained r/o candidia, will also check c/g/trich       Relevant Orders   NuSwab Vaginitis Plus (VG+)     Other   Anxiety    Underlying. Pt states she used to be in therapy and take medication Suspicion that this plays a larger role in her symptoms and rumination.  Will continue to monitor, pt declines therapy referral at this time       BMI 37.0-37.9, adult    Lengthy discussion over weight loss history.  Previously tried: Phentermine--increased BP CI: Other stimulants d/t elevated BP and underlying anxiety; contrave is CI d/t history of anxiety/depression  Previously seen nutritionists, adhered to different calorie restricted diets and regular exercise.  Pt would like to try wegovy -- discussed this vs metformin for underling pre-dm and insulin resistance. Pt would prefer to try wegovy-- explained MOA, SE, method of administration, likely will need PA.   F/u 3 mo        Relevant Medications   Semaglutide-Weight Management (WEGOVY) 0.25 MG/0.5ML SOAJ   Other Relevant Orders   HgB A1c   Comprehensive Metabolic Panel (CMET)   TSH + free T4   Prediabetes    Advised repeat labs       Relevant Orders   HgB A1c   Moderate mixed hyperlipidemia not requiring statin therapy    Advised repeat fasting labs       Relevant Orders   Lipid Profile   Comprehensive Metabolic Panel (CMET)   Other Visit Diagnoses     Routine screening for STI (sexually transmitted infection)       Relevant Orders   RPR       I spoke with and examined the patient discussing  in turn her individual concerns and options for weight loss, weight management history, for 45 minutes.   Return in about 3 months (around 03/16/2022) for weight Management, hypertension.      I, Mikey Kirschner, PA-C have reviewed all documentation for this visit. The documentation on  12/14/2021  for the exam, diagnosis, procedures, and orders are all accurate and complete.  Mikey Kirschner, PA-C [facility] [address] [place], [place], [address] Office: [phone] Fax: [place]

## 2021-12-14 NOTE — Assessment & Plan Note (Signed)
Advised repeat labs

## 2021-12-14 NOTE — Assessment & Plan Note (Addendum)
Underlying. Pt states she used to be in therapy and take medication Suspicion that this plays a larger role in her symptoms and rumination.  Will continue to monitor, pt declines therapy referral at this time

## 2021-12-18 ENCOUNTER — Other Ambulatory Visit: Payer: Self-pay

## 2021-12-18 ENCOUNTER — Other Ambulatory Visit: Payer: Self-pay | Admitting: Physician Assistant

## 2021-12-18 DIAGNOSIS — B9689 Other specified bacterial agents as the cause of diseases classified elsewhere: Secondary | ICD-10-CM

## 2021-12-18 LAB — NUSWAB VAGINITIS PLUS (VG+)
Atopobium vaginae: HIGH Score — AB
BVAB 2: HIGH Score — AB
Candida albicans, NAA: NEGATIVE
Candida glabrata, NAA: NEGATIVE
Chlamydia trachomatis, NAA: NEGATIVE
Megasphaera 1: HIGH Score — AB
Neisseria gonorrhoeae, NAA: NEGATIVE
Trich vag by NAA: NEGATIVE

## 2021-12-18 MED ORDER — METRONIDAZOLE 500 MG PO TABS
500.0000 mg | ORAL_TABLET | Freq: Two times a day (BID) | ORAL | 0 refills | Status: AC
  Filled 2021-12-18: qty 14, 7d supply, fill #0

## 2021-12-20 ENCOUNTER — Encounter: Payer: Self-pay | Admitting: Physician Assistant

## 2021-12-20 ENCOUNTER — Other Ambulatory Visit: Payer: Self-pay

## 2021-12-25 ENCOUNTER — Other Ambulatory Visit: Payer: Self-pay

## 2021-12-25 ENCOUNTER — Encounter: Payer: Self-pay | Admitting: Pharmacist

## 2021-12-25 ENCOUNTER — Other Ambulatory Visit: Payer: Self-pay | Admitting: Physician Assistant

## 2021-12-25 DIAGNOSIS — Z6837 Body mass index (BMI) 37.0-37.9, adult: Secondary | ICD-10-CM

## 2021-12-25 MED ORDER — LIRAGLUTIDE -WEIGHT MANAGEMENT 18 MG/3ML ~~LOC~~ SOPN
0.6000 mg | PEN_INJECTOR | Freq: Every day | SUBCUTANEOUS | 1 refills | Status: DC
  Filled 2021-12-25 – 2021-12-27 (×2): qty 15, 30d supply, fill #0
  Filled 2022-01-21: qty 15, 30d supply, fill #1

## 2021-12-27 ENCOUNTER — Encounter: Payer: Self-pay | Admitting: Physician Assistant

## 2021-12-27 ENCOUNTER — Other Ambulatory Visit: Payer: Self-pay

## 2021-12-27 MED ORDER — UNIFINE PENTIPS 31G X 5 MM MISC
0 refills | Status: DC
  Filled 2021-12-27: qty 100, 90d supply, fill #0

## 2021-12-28 ENCOUNTER — Other Ambulatory Visit: Payer: Self-pay

## 2021-12-28 ENCOUNTER — Encounter: Payer: Self-pay | Admitting: Surgery

## 2021-12-28 ENCOUNTER — Ambulatory Visit (INDEPENDENT_AMBULATORY_CARE_PROVIDER_SITE_OTHER): Payer: 59 | Admitting: Surgery

## 2021-12-28 VITALS — BP 147/87 | HR 86 | Temp 98.5°F | Ht 62.0 in | Wt 204.2 lb

## 2021-12-28 DIAGNOSIS — K644 Residual hemorrhoidal skin tags: Secondary | ICD-10-CM | POA: Diagnosis not present

## 2021-12-28 NOTE — H&P (View-Only) (Signed)
12/28/2021  Reason for Visit: External hemorrhoids with anal skin tags  Requesting Provider: Sherri Sear, MD  History of Present Illness: Audrey Koch is a 51 y.o. female presenting for evaluation of perianal skin tags and external hemorrhoids.  The patient does have a history of hemorrhoid issues in the past and has had prior hemorrhoidectomy of the anterior external and internal column by Dr. Jamal Collin in 2015 and has also had banding of 2 different internal hemorrhoids by Dr. Marius Ditch.  The patient reports that she has issues with constipation and has a bowel movement every few days and she has to strain for that.  She has had chronic issues with constipation for many years.  She reports that she does not do a good job about drinking plenty of fluid or adding fiber to her diet.  The patient reports that more recently she has been having issues with the external hemorrhoids because of hygiene issues.  She reports that because of hygiene issues, she gets itching in the anal area, then she tries to wipe more thoroughly and this is causing skin excoriation and tears which then causes pain and more issues.  She is ready to seek definitive treatment for this.  Patient reports that she has tried sitz bath's, different barrier ointments to help with the excoriation, but reports that nothing seems to be fully helping.  Currently denies any perianal pain or any bleeding.  Past Medical History: Past Medical History:  Diagnosis Date   Hyperlipidemia    Hypertension    Migraine      Past Surgical History: Past Surgical History:  Procedure Laterality Date   COLONOSCOPY WITH PROPOFOL N/A 06/04/2021   Procedure: COLONOSCOPY WITH PROPOFOL;  Surgeon: Lin Landsman, MD;  Location: [facility] ENDOSCOPY;  Service: Gastroenterology;  Laterality: N/A;   HEMORROIDECTOMY  10/08/2013    Home Medications: Prior to Admission medications   Medication Sig Start Date End Date Taking? Authorizing Provider  Blood Pressure  Monitoring (OMRON 3 SERIES BP MONITOR) DEVI use to check blood pressure 10/31/21  Yes Nicks, Ewing Schlein, RPH  Cholecalciferol (VITAMIN D3) 250 MCG (10000 UT) capsule 1 capsule   Yes [provider]  ibuprofen (ADVIL) 400 MG tablet 1 tablet with food or milk as needed   Yes [provider]  Insulin Pen Needle (UNIFINE PENTIPS) 31G X 5 MM MISC Use as directed with Saxenda 12/27/21  Yes Drubel, Ria Comment, PA-C  Lidocaine-Hydrocortisone Ace 2.8-0.55 % GEL Use rectally BID prn for hemorrhoid irritation 03/09/21  Yes Bacigalupo, Dionne Bucy, MD  Liraglutide -Weight Management 18 MG/3ML SOPN Inject 0.6 mg into the skin daily. For one week. Then increase to 1.2 mg daily for one week. Then increase to 1.8 mg daily. 12/25/21  Yes Mikey Kirschner, PA-C  metoprolol succinate (TOPROL-XL) 25 MG 24 hr tablet TAKE 1 TABLET BY MOUTH DAILY 10/31/21 01/30/22 Yes Drubel, Ria Comment, PA-C  mometasone (NASONEX) 50 MCG/ACT nasal spray Place 2 sprays into the nose daily. 05/08/21  Yes Dohmeier, Asencion Partridge, MD  Multiple Vitamins-Minerals (VITAMIN D3 COMPLETE PO) Take by mouth.   Yes [provider]    Allergies: Allergies  Allergen Reactions   Phentermine Hcl Other (See Comments)   Spironolactone Other (See Comments)   Sulfa Antibiotics Rash    Social History:  reports that she has never smoked. She has never used smokeless tobacco. She reports that she does not drink alcohol and does not use drugs.   Family History: Family History  Problem Relation Age of Onset  Heart attack Mother    Hyperlipidemia Mother    Dementia Maternal Grandmother    Diabetes Maternal Grandfather    Dementia Paternal Grandmother    Diabetes Paternal Grandfather    Esophageal cancer Paternal Grandfather    Diabetes Brother    Breast cancer Neg Hx     Review of Systems: Review of Systems  Constitutional:  Negative for chills and fever.  HENT:  Negative for hearing loss.   Respiratory:  Negative for shortness of breath.    Cardiovascular:  Negative for chest pain.  Gastrointestinal:  Positive for constipation. Negative for abdominal pain, nausea and vomiting.  Genitourinary:  Negative for dysuria.  Musculoskeletal:  Negative for myalgias.  Skin:  Positive for itching. Negative for rash.  Neurological:  Negative for dizziness.  Psychiatric/Behavioral:  Negative for depression.    Physical Exam BP (!) 147/87   Pulse 86   Temp 98.5 F (36.9 C) (Oral)   Ht '5\' 2"'$  (1.575 m)   Wt 204 lb 3.2 oz (92.6 kg)   SpO2 98%   BMI 37.35 kg/m  CONSTITUTIONAL: No acute distress, well-nourished HEENT:  Normocephalic, atraumatic, extraocular motion intact. NECK: Trachea is midline, and there is no jugular venous distension.  RESPIRATORY:  Normal respiratory effort without pathologic use of accessory muscles. CARDIOVASCULAR: Regular rhythm and rate. RECTAL: External exam reveals enlarged but noninflamed external hemorrhoids of all 3 columns.  There is associated perianal skin moisture and excoriation as described by the patient.  Currently there are no open wounds or ulcerations.  There is no tenderness on palpation.  On digital rectal exam, the patient may have somewhat enlarged internal component of right posterior and left lateral columns.  No gross blood. MUSCULOSKELETAL:  Normal muscle strength and tone in all four extremities.  No peripheral edema or cyanosis. SKIN: Skin turgor is normal. There are no pathologic skin lesions.  NEUROLOGIC:  Motor and sensation is grossly normal.  Cranial nerves are grossly intact. PSYCH:  Alert and oriented to person, place and time. Affect is normal.  Laboratory Analysis: Labs from 07/06/2021: Sodium 138, potassium 4.1, chloride 102, CO2 22, BUN 11, creatinine 0.92.  LFTs within normal limits.  Imaging: No results found.  Assessment and Plan: This is a 51 y.o. female with enlarged external hemorrhoids/anal skin tags.  - Discussed with the patient how the flareups of her external  hemorrhoids have led to persistent anal skin tags and how these enlarged areas are creating issues with hygiene as the stool can get stuck in between the skin folds of the anal skin tags and prevent appropriate cleaning which would then lead to the itching and the irritation of the perianal skin which has been leading to the moisture and excoriation.  Unfortunately patient has not been able to find a good regimen to help and is now seeking definitive treatment of this area.  Discussed with her that we can certainly assist with surgery to remove the excess tissue and what would be an exam under anesthesia with hemorrhoidectomy of the 3 columns.  Discussed with her that in doing this, we will also likely excise any enlarged internal components.  Discussed with her that unfortunately the surgery does come with significant pain postoperatively due to the exquisite number of pain receptors in the perianal area externally.  However this will improve as the wound heal.  Reviewed with her the surgery at length including the risks of bleeding, infection, injury to surrounding structures, that this an outpatient surgery, postoperative pain control, activity  restrictions, when to return to work, and she is willing to proceed. - Discussed with her also that even now but definitely after surgery, we will need to work on the risk factor for her hemorrhoid which is mainly her constipation.  Discussed with her the importance of increasing the fiber intake in her diet whether is by eating higher content food versus supplementing with Benefiber or Metamucil.  She also would need to drink more fluids she can take MiraLAX once daily to help with constipation.  Ideally would be good to have a bowel movement once daily that is soft without needing to strain much. - We will tentatively schedule her for surgery on 01/16/2022.  She will check at work to make sure that that date works well and let us know if any changes need to be  made.  I spent 80 minutes dedicated to the care of this patient on the date of this encounter to include pre-visit review of records, face-to-face time with the patient discussing diagnosis and management, and any post-visit coordination of care.   Melvyn Neth, [facility]

## 2021-12-28 NOTE — Patient Instructions (Signed)
Please use a Fleets Enema the night before your surgery and 1 hour before your surgery. This can be purchased at [place] or any drug store.  Our surgery scheduler will call you within 24-48 hours to schedule your surgery. Please have the [place] surgery sheet available when speaking with her. Try taking Miralax daily for bowel movements.   Fiber Content in Foods Fiber is a substance that is found in plant foods, such as fruits, vegetables, whole grains, nuts, seeds, and beans. As part of your treatment and recovery plan, your health care provider may recommend that you eat foods that have specific amounts of dietary fiber. Some conditions may require a high-fiber diet while others may require a low-fiber diet. This sheet gives you information about the dietary fiber content of some common foods. Your health care provider will tell you how much fiber you need in your diet. If you have problems or questions, contact your health care provider or dietitian. What foods are high in fiber?  Fruits Blackberries or raspberries (fresh) --  cup (75 g) has 4 g of fiber. Pear (fresh) -- 1 medium (180 g) has 5.5 g of fiber. Prunes (dried) -- 6 to 8 pieces (57-76 g) has 5 g of fiber. Apple with skin -- 1 medium (182 g) has 4.8 g of fiber. Guava -- 1 cup (128 g) has 8.9 g of fiber. Vegetables Peas (frozen) --  cup (80 g) has 4.4 g of fiber. Potato with skin (baked) -- 1 medium (173 g) has 4.4 g of fiber. Pumpkin (canned) --  cup (122 g) has 5 g of fiber. Brussels sprouts (cooked) --  cup (78 g) has 4 g of fiber. Sweet potato --  cup mashed (124 g) has 4 g of fiber. Winter squash -- 1 cup cooked (205 g) has 5.7 g of fiber. Grains Bran cereal --  cup (31 g) has 8.6 g of fiber. Bulgur (cooked) --  cup (70 g) has 4 g of fiber. Quinoa (cooked) -- 1 cup (185 g) has 5.2 g of fiber. Popcorn -- 3 cups (375 g) popped has 5.8 g of fiber. Spaghetti, whole wheat -- 1 cup (140 g) has 6 g of fiber. Meats and other  proteins Pinto beans (cooked) --  cup (90 g) has 7.7 g of fiber. Lentils (cooked) --  cup (90 g) has 7.8 g of fiber. Kidney beans (canned) --  cup (92.5 g) has 5.7 g of fiber. Soybeans (canned, frozen, or fresh) --  cup (92.5 g) has 5.2 g of fiber. Baked beans, plain or vegetarian (canned) --  cup (130 g) has 5.2 g of fiber. Garbanzo beans or chickpeas (canned) --  cup (90 g) has 6.6 g of fiber. Black beans (cooked) --  cup (86 g) has 7.5 g of fiber. White beans or navy beans (cooked) --  cup (91 g) has 9.3 g of fiber. The items listed above may not be a complete list of foods with high fiber. Actual amounts of fiber may be different depending on processing. Contact a dietitian for more information. What foods are moderate in fiber?  Fruits Banana -- 1 medium (126 g) has 3.2 g of fiber. Melon -- 1 cup (155 g) has 1.4 g of fiber. Orange -- 1 small (154 g) has 3.7 g of fiber. Raisins --  cup (40 g) has 1.8 g of fiber. Applesauce, sweetened --  cup (125 g) has 1.5 g of fiber. Blueberries (fresh) --  cup (75 g) has 1.8 g  of fiber. Strawberries (fresh, sliced) -- 1 cup (478 g) has 3 g of fiber. Cherries -- 1 cup (140 g) has 2.9 g of fiber. Vegetables Broccoli (cooked) --  cup (77.5 g) has 2.1 g of fiber. Carrots (cooked) --  cup (77.5 g) has 2.2 g of fiber. Corn (canned or frozen) --  cup (82.5 g) has 2.1 g of fiber. Potatoes, mashed --  cup (105 g) has 1.6 g of fiber. Tomato -- 1 medium (62 g) has 1.5 g of fiber. Green beans (canned) --  cup (83 g) has 2 g of fiber. Squash, winter --  cup (58 g) has 1 g of fiber. Sweet potato, baked -- 1 medium (150 g) has 3 g of fiber. Cauliflower (cooked) -- 1/2 cup (90 g) has 2.3 g of fiber. Grains Long-grain brown rice (cooked) -- 1 cup (196 g) has 3.5 g of fiber. Bagel, plain -- one 4-inch (10 cm) bagel has 2 g of fiber. Instant oatmeal --  cup (120 g) has about 2 g of fiber. Macaroni noodles, enriched (cooked) -- 1 cup (140 g)  has 2.5 g of fiber. Multigrain cereal --  cup (15 g) has about 2-4 g of fiber. Whole-wheat bread -- 1 slice (26 g) has 2 g of fiber. Whole-wheat spaghetti noodles --  cup (70 g) has 3.2 g of fiber. Corn tortilla -- one 6-inch (15 cm) tortilla has 1.5 g of fiber. Meats and other proteins Almonds --  cup or 1 oz (28 g) has 3.5 g of fiber. Sunflower seeds in shell --  cup or  oz (11.5 g) has 1.1 g of fiber. Vegetable or soy patty -- 1 patty (70 g) has 3.4 g of fiber. Walnuts --  cup or 1 oz (30 g) has 2 g of fiber. Flax seed -- 1 Tbsp (7 g) has 2.8 g of fiber. The items listed above may not be a complete list of foods that have moderate amounts of fiber. Actual amounts of fiber may be different depending on processing. Contact a dietitian for more information. What foods are low in fiber?  Low-fiber foods contain less than 1 g of fiber per serving. They include: Fruits Fruit juice --  cup or 4 fl oz (118 mL) has 0.5 g of fiber. Vegetables Lettuce -- 1 cup (35 g) has 0.5 g of fiber. Cucumber (slices) --  cup (60 g) has 0.3 g of fiber. Celery -- 1 stalk (40 g) has 0.1 g of fiber. Grains Flour tortilla -- one 6-inch (15 cm) tortilla has 0.5 g of fiber. White rice (cooked) --  cup (81.5 g) has 0.3 g of fiber. Meats and other proteins Egg -- 1 large (50 g) has 0 g of fiber. Meat, poultry, or fish -- 3 oz (85 g) has 0 g of fiber. Dairy Milk -- 1 cup or 8 fl oz (237 mL) has 0 g of fiber. Yogurt -- 1 cup (245 g) has 0 g of fiber. The items listed above may not be a complete list of foods that are low in fiber. Actual amounts of fiber may be different depending on processing. Contact a dietitian for more information. Summary Fiber is a substance that is found in plant foods, such as fruits, vegetables, whole grains, nuts, seeds, and beans. As part of your treatment and recovery plan, your health care provider may recommend that you eat foods that have specific amounts of dietary  fiber. This information is not intended to replace advice given to you  by your health care provider. Make sure you discuss any questions you have with your health care provider. Document Revised: 11/18/2019 Document Reviewed: 11/18/2019 Elsevier Patient Education  [place].   Constipation, Adult Constipation is when a person has fewer than three bowel movements in a week, has difficulty having a bowel movement, or has stools (feces) that are dry, hard, or larger than normal. Constipation may be caused by an underlying condition. It may become worse with age if a person takes certain medicines and does not take in enough fluids. Follow these instructions at home: Eating and drinking  Eat foods that have a lot of fiber, such as beans, whole grains, and fresh fruits and vegetables. Limit foods that are low in fiber and high in fat and processed sugars, such as fried or sweet foods. These include french fries, hamburgers, cookies, candies, and soda. Drink enough fluid to keep your urine pale yellow. General instructions Exercise regularly or as told by your health care provider. Try to do 150 minutes of moderate exercise each week. Use the bathroom when you have the urge to go. Do not hold it in. Take over-the-counter and prescription medicines only as told by your health care provider. This includes any fiber supplements. During bowel movements: Practice deep breathing while relaxing the lower abdomen. Practice pelvic floor relaxation. Watch your condition for any changes. Let your health care provider know about them. Keep all follow-up visits as told by your health care provider. This is important. Contact a health care provider if: You have pain that gets worse. You have a fever. You do not have a bowel movement after 4 days. You vomit. You are not hungry or you lose weight. You are bleeding from the opening between the buttocks (anus). You have thin, pencil-like stools. Get  help right away if: You have a fever and your symptoms suddenly get worse. You leak stool or have blood in your stool. Your abdomen is bloated. You have severe pain in your abdomen. You feel dizzy or you faint. Summary Constipation is when a person has fewer than three bowel movements in a week, has difficulty having a bowel movement, or has stools (feces) that are dry, hard, or larger than normal. Eat foods that have a lot of fiber, such as beans, whole grains, and fresh fruits and vegetables. Drink enough fluid to keep your urine pale yellow. Take over-the-counter and prescription medicines only as told by your health care provider. This includes any fiber supplements. This information is not intended to replace advice given to you by your health care provider. Make sure you discuss any questions you have with your health care provider. Document Revised: 06/02/2019 Document Reviewed: 06/02/2019 Elsevier Patient Education  [place]. How to Take a [employer] A sitz bath is a warm water bath that may be used to care for your rectum, genital area, or the area between your rectum and genitals (perineum). In a sitz bath, the water only comes up to your hips and covers your buttocks. A sitz bath may be done in a bathtub or with a portable sitz bath that fits over the toilet. Your health care provider may recommend a sitz bath to help: Relieve pain and discomfort after delivering a baby. Relieve pain and itching from hemorrhoids or anal fissures. Relieve pain after certain surgeries. Relax muscles that are sore or tight. How to take a sitz bath Take 3-4 sitz baths a day, or as many as told by your health  care provider. Bathtub sitz bath To take a sitz bath in a bathtub: Partially fill a bathtub with warm water. The water should be deep enough to cover your hips and buttocks when you are sitting in the tub. Follow your health care provider's instructions if you are told to put medicine in  the water. Sit in the water. Open the tub drain a little, and leave it open during your bath. Turn on the warm water again, enough to replace the water that is draining out. Keep the water running throughout your bath. This helps keep the water at the right level and temperature. Soak in the water for 15-20 minutes, or as long as told by your health care provider. When you are done, be careful when you stand up. You may feel dizzy. After the sitz bath, pat yourself dry. Do not rub your skin to dry it.  Over-the-toilet sitz bath To take a sitz bath with an over-the-toilet basin: Follow the manufacturer's instructions. Fill the basin with warm water. Follow your health care provider's instructions if you were told to put medicine in the water. Sit on the seat. Make sure the water covers your buttocks and perineum. Soak in the water for 15-20 minutes, or as long as told by your health care provider. After the sitz bath, pat yourself dry. Do not rub your skin to dry it. Clean and dry the basin between uses. Discard the basin if it cracks, or according to the manufacturer's instructions.  Contact a health care provider if: Your pain or itching gets worse. Do not continue with sitz baths if your symptoms get worse. You have new symptoms. Do not continue with sitz baths until you talk with your health care provider. Summary A sitz bath is a warm water bath in which the water only comes up to your hips and covers your buttocks. A sitz bath may help relieve pain and discomfort after delivering a baby. It also may help with pain and itching from hemorrhoids or anal fissures, or pain after certain surgeries. It can also help to relax muscles that are sore or tight. Take 3-4 sitz baths a day, or as many as told by your health care provider. Soak in the water for 15-20 minutes. Do not continue with sitz baths if your symptoms get worse. This information is not intended to replace advice given to you by  your health care provider. Make sure you discuss any questions you have with your health care provider. Document Revised: 03/28/2020 Document Reviewed: 03/30/2020 Elsevier Patient Education  [place].    Surgical Procedures for Hemorrhoids, Care After This sheet gives you information about how to care for yourself after your procedure. Your health care provider may also give you more specific instructions. If you have problems or questions, contact your health care provider. What can I expect after the procedure? After the procedure, it is common to have: Rectal pain. Pain when you are having a bowel movement. Slight rectal bleeding. This is more likely to happen with the first bowel movement after surgery. Follow these instructions at home: Medicines Take over-the-counter and prescription medicines only as told by your health care provider. If you were prescribed an antibiotic medicine, use it as told by your health care provider. Do not stop using the antibiotic even if your condition improves. Ask your health care provider if the medicine prescribed to you requires you to avoid driving or using heavy machinery. Use a stool softener or a bulk laxative  as told by your health care provider. Eating and drinking Follow instructions from your health care provider about what to eat or drink after your procedure. You may need to take actions to prevent or treat constipation, such as: Drink enough fluid to keep your urine pale yellow. Take over-the-counter or prescription medicines. Eat foods that are high in fiber, such as beans, whole grains, and fresh fruits and vegetables. Limit foods that are high in fat and processed sugars, such as fried or sweet foods. Activity  Rest as told by your health care provider. Avoid sitting for a long time without moving. Get up to take short walks every 1-2 hours. This is important to improve blood flow and breathing. Ask for help if you feel weak  or unsteady. Return to your normal activities as told by your health care provider. Ask your health care provider what activities are safe for you. Do not lift anything that is heavier than 10 lb (4.5 kg), or the limit that you are told, until your health care provider says that it is safe. Do not strain to have a bowel movement. Do not spend a long time sitting on the toilet. General instructions  Take warm sitz baths for 15-20 minutes, 2-3 times a day to relieve soreness or itching and to keep the rectal area clean. Apply ice packs to the area to reduce swelling and pain. Do not drive for 24 hours if you were given a sedative during your procedure. Keep all follow-up visits as told by your health care provider. This is important. Contact a health care provider if: Your pain medicine is not helping. You have a fever or chills. You have bad smelling drainage. You have a lot of swelling. You become constipated. You have trouble passing urine. Get help right away if: You have very bad rectal pain. You have heavy bleeding from your rectum. Summary After the procedure, it is common to have pain and slight rectal bleeding. Take warm sitz baths for 15-20 minutes, 2-3 times a day to relieve soreness or itching and to keep the rectal area clean. Avoid straining when having a bowel movement. Eat foods that are high in fiber, such as beans, whole grains, and fresh fruits and vegetables. Take over-the-counter and prescription medicines only as told by your health care provider. This information is not intended to replace advice given to you by your health care provider. Make sure you discuss any questions you have with your health care provider. Document Revised: 01/24/2021 Document Reviewed: 01/24/2021 Elsevier Patient Education  [place].

## 2021-12-28 NOTE — Progress Notes (Signed)
12/28/2021  Reason for Visit: External hemorrhoids with anal skin tags  Requesting Provider: Sherri Sear, MD  History of Present Illness: Audrey Koch is a 51 y.o. female presenting for evaluation of perianal skin tags and external hemorrhoids.  The patient does have a history of hemorrhoid issues in the past and has had prior hemorrhoidectomy of the anterior external and internal column by Dr. Jamal Collin in 2015 and has also had banding of 2 different internal hemorrhoids by Dr. Marius Ditch.  The patient reports that she has issues with constipation and has a bowel movement every few days and she has to strain for that.  She has had chronic issues with constipation for many years.  She reports that she does not do a good job about drinking plenty of fluid or adding fiber to her diet.  The patient reports that more recently she has been having issues with the external hemorrhoids because of hygiene issues.  She reports that because of hygiene issues, she gets itching in the anal area, then she tries to wipe more thoroughly and this is causing skin excoriation and tears which then causes pain and more issues.  She is ready to seek definitive treatment for this.  Patient reports that she has tried sitz bath's, different barrier ointments to help with the excoriation, but reports that nothing seems to be fully helping.  Currently denies any perianal pain or any bleeding.  Past Medical History: Past Medical History:  Diagnosis Date   Hyperlipidemia    Hypertension    Migraine      Past Surgical History: Past Surgical History:  Procedure Laterality Date   COLONOSCOPY WITH PROPOFOL N/A 06/04/2021   Procedure: COLONOSCOPY WITH PROPOFOL;  Surgeon: Lin Landsman, MD;  Location: [facility];  Service: Gastroenterology;  Laterality: N/A;   HEMORROIDECTOMY  10/08/2013    Home Medications: Prior to Admission medications   Medication Sig Start Date End Date Taking? Authorizing Provider  Blood Pressure  Monitoring (OMRON 3 SERIES BP MONITOR) DEVI use to check blood pressure 10/31/21  Yes Nicks, Ewing Schlein, RPH  Cholecalciferol (VITAMIN D3) 250 MCG (10000 UT) capsule 1 capsule   Yes [provider]  ibuprofen (ADVIL) 400 MG tablet 1 tablet with food or milk as needed   Yes [provider]  Insulin Pen Needle (UNIFINE PENTIPS) 31G X 5 MM MISC Use as directed with Saxenda 12/27/21  Yes Drubel, Ria Comment, PA-C  Lidocaine-Hydrocortisone Ace 2.8-0.55 % GEL Use rectally BID prn for hemorrhoid irritation 03/09/21  Yes Bacigalupo, Dionne Bucy, MD  Liraglutide -Weight Management 18 MG/3ML SOPN Inject 0.6 mg into the skin daily. For one week. Then increase to 1.2 mg daily for one week. Then increase to 1.8 mg daily. 12/25/21  Yes Mikey Kirschner, PA-C  metoprolol succinate (TOPROL-XL) 25 MG 24 hr tablet TAKE 1 TABLET BY MOUTH DAILY 10/31/21 01/30/22 Yes Drubel, Ria Comment, PA-C  mometasone (NASONEX) 50 MCG/ACT nasal spray Place 2 sprays into the nose daily. 05/08/21  Yes Dohmeier, Asencion Partridge, MD  Multiple Vitamins-Minerals (VITAMIN D3 COMPLETE PO) Take by mouth.   Yes [provider]    Allergies: Allergies  Allergen Reactions   Phentermine Hcl Other (See Comments)   Spironolactone Other (See Comments)   Sulfa Antibiotics Rash    Social History:  reports that she has never smoked. She has never used smokeless tobacco. She reports that she does not drink alcohol and does not use drugs.   Family History: Family History  Problem Relation Age of Onset  Heart attack Mother    Hyperlipidemia Mother    Dementia Maternal Grandmother    Diabetes Maternal Grandfather    Dementia Paternal Grandmother    Diabetes Paternal Grandfather    Esophageal cancer Paternal Grandfather    Diabetes Brother    Breast cancer Neg Hx     Review of Systems: Review of Systems  Constitutional:  Negative for chills and fever.  HENT:  Negative for hearing loss.   Respiratory:  Negative for shortness of breath.    Cardiovascular:  Negative for chest pain.  Gastrointestinal:  Positive for constipation. Negative for abdominal pain, nausea and vomiting.  Genitourinary:  Negative for dysuria.  Musculoskeletal:  Negative for myalgias.  Skin:  Positive for itching. Negative for rash.  Neurological:  Negative for dizziness.  Psychiatric/Behavioral:  Negative for depression.    Physical Exam BP (!) 147/87   Pulse 86   Temp 98.5 F (36.9 C) (Oral)   Ht '5\' 2"'$  (1.575 m)   Wt 204 lb 3.2 oz (92.6 kg)   SpO2 98%   BMI 37.35 kg/m  CONSTITUTIONAL: No acute distress, well-nourished HEENT:  Normocephalic, atraumatic, extraocular motion intact. NECK: Trachea is midline, and there is no jugular venous distension.  RESPIRATORY:  Normal respiratory effort without pathologic use of accessory muscles. CARDIOVASCULAR: Regular rhythm and rate. RECTAL: External exam reveals enlarged but noninflamed external hemorrhoids of all 3 columns.  There is associated perianal skin moisture and excoriation as described by the patient.  Currently there are no open wounds or ulcerations.  There is no tenderness on palpation.  On digital rectal exam, the patient may have somewhat enlarged internal component of right posterior and left lateral columns.  No gross blood. MUSCULOSKELETAL:  Normal muscle strength and tone in all four extremities.  No peripheral edema or cyanosis. SKIN: Skin turgor is normal. There are no pathologic skin lesions.  NEUROLOGIC:  Motor and sensation is grossly normal.  Cranial nerves are grossly intact. PSYCH:  Alert and oriented to person, place and time. Affect is normal.  Laboratory Analysis: Labs from 07/06/2021: Sodium 138, potassium 4.1, chloride 102, CO2 22, BUN 11, creatinine 0.92.  LFTs within normal limits.  Imaging: No results found.  Assessment and Plan: This is a 51 y.o. female with enlarged external hemorrhoids/anal skin tags.  - Discussed with the patient how the flareups of her external  hemorrhoids have led to persistent anal skin tags and how these enlarged areas are creating issues with hygiene as the stool can get stuck in between the skin folds of the anal skin tags and prevent appropriate cleaning which would then lead to the itching and the irritation of the perianal skin which has been leading to the moisture and excoriation.  Unfortunately patient has not been able to find a good regimen to help and is now seeking definitive treatment of this area.  Discussed with her that we can certainly assist with surgery to remove the excess tissue and what would be an exam under anesthesia with hemorrhoidectomy of the 3 columns.  Discussed with her that in doing this, we will also likely excise any enlarged internal components.  Discussed with her that unfortunately the surgery does come with significant pain postoperatively due to the exquisite number of pain receptors in the perianal area externally.  However this will improve as the wound heal.  Reviewed with her the surgery at length including the risks of bleeding, infection, injury to surrounding structures, that this an outpatient surgery, postoperative pain control, activity  restrictions, when to return to work, and she is willing to proceed. - Discussed with her also that even now but definitely after surgery, we will need to work on the risk factor for her hemorrhoid which is mainly her constipation.  Discussed with her the importance of increasing the fiber intake in her diet whether is by eating higher content food versus supplementing with Benefiber or Metamucil.  She also would need to drink more fluids she can take MiraLAX once daily to help with constipation.  Ideally would be good to have a bowel movement once daily that is soft without needing to strain much. - We will tentatively schedule her for surgery on 01/16/2022.  She will check at work to make sure that that date works well and let us know if any changes need to be  made.  I spent 80 minutes dedicated to the care of this patient on the date of this encounter to include pre-visit review of records, face-to-face time with the patient discussing diagnosis and management, and any post-visit coordination of care.   Melvyn Neth, [facility]

## 2021-12-31 ENCOUNTER — Telehealth: Payer: Self-pay | Admitting: Surgery

## 2021-12-31 ENCOUNTER — Encounter: Payer: Self-pay | Admitting: Physician Assistant

## 2021-12-31 NOTE — Telephone Encounter (Signed)
Patient has been advised of Pre-Admission date/time, COVID Testing date and Surgery date.  Surgery Date: 01/16/22 Preadmission Testing Date: 01/09/22 (phone 8a-1p) Covid Testing Date: Not needed.   Patient has been made aware to call [phone], between 1-3:00pm the day before surgery, to find out what time to arrive for surgery.

## 2022-01-02 ENCOUNTER — Other Ambulatory Visit: Payer: Self-pay

## 2022-01-02 MED ORDER — NYSTATIN 100000 UNIT/ML MT SUSP
OROMUCOSAL | 0 refills | Status: DC
  Filled 2022-01-02: qty 280, 14d supply, fill #0

## 2022-01-04 ENCOUNTER — Encounter: Payer: Self-pay | Admitting: Physician Assistant

## 2022-01-04 ENCOUNTER — Other Ambulatory Visit: Payer: Self-pay

## 2022-01-04 ENCOUNTER — Ambulatory Visit (INDEPENDENT_AMBULATORY_CARE_PROVIDER_SITE_OTHER): Payer: 59 | Admitting: Physician Assistant

## 2022-01-04 VITALS — BP 157/110 | HR 101 | Ht 62.0 in | Wt 201.5 lb

## 2022-01-04 DIAGNOSIS — R112 Nausea with vomiting, unspecified: Secondary | ICD-10-CM | POA: Diagnosis not present

## 2022-01-04 DIAGNOSIS — K143 Hypertrophy of tongue papillae: Secondary | ICD-10-CM | POA: Diagnosis not present

## 2022-01-04 MED ORDER — ONDANSETRON HCL 4 MG PO TABS
4.0000 mg | ORAL_TABLET | Freq: Three times a day (TID) | ORAL | 0 refills | Status: DC | PRN
  Filled 2022-01-04: qty 20, 7d supply, fill #0

## 2022-01-04 NOTE — Progress Notes (Signed)
I,Sha'taria Tyson,acting as a Neurosurgeon for [employer], PA-C.,have documented all relevant documentation on the behalf of Alfredia Ferguson, PA-C,as directed by  Alfredia Ferguson, PA-C while in the presence of Alfredia Ferguson, PA-C.   Established patient visit   Patient: Audrey Koch   DOB: 11-26-1970   51 y.o. Female  MRN: [id number] Visit Date: 01/04/2022  Today's healthcare provider: Alfredia Ferguson, PA-C   Cc. Nausea, vomiting, diarrhea x 1 week  Subjective     Kaydi is a 51 y/o female who presents today concerned over nausea, vomiting, diarrhea x 1 week. She started Saxenda injections on Friday, and on this Monday, started feeling nauseous that progressed to vomiting. Around the same time she finished a course of Flagyl for BV. She has been experiencing nausea, lack of appetite, vomiting, and diarrhea for the last week. Denies dizziness, chest pain, SOB, hematemesis, bloody BM, abdominal pain. She has increased fluids, went and had a electrolyte IV, and is eating small plain meals. She has also been burping more.  She took pepto bismol a few times, and now has a black tongue.   Medications: Outpatient Medications Prior to Visit  Medication Sig   Blood Pressure Monitoring (OMRON 3 SERIES BP MONITOR) DEVI use to check blood pressure   Cholecalciferol (VITAMIN D3) 250 MCG (10000 UT) capsule 1 capsule   ibuprofen (ADVIL) 400 MG tablet 1 tablet with food or milk as needed   Insulin Pen Needle (UNIFINE PENTIPS) 31G X 5 MM MISC Use as directed with Saxenda   Lidocaine-Hydrocortisone Ace 2.8-0.55 % GEL Use rectally BID prn for hemorrhoid irritation   Liraglutide -Weight Management 18 MG/3ML SOPN Inject 0.6 mg into the skin daily. For one week. Then increase to 1.2 mg daily for one week. Then increase to 1.8 mg daily.   metoprolol succinate (TOPROL-XL) 25 MG 24 hr tablet TAKE 1 TABLET BY MOUTH DAILY   mometasone (NASONEX) 50 MCG/ACT nasal spray Place 2 sprays into the nose daily.   Multiple  Vitamins-Minerals (VITAMIN D3 COMPLETE PO) Take by mouth.   nystatin (MYCOSTATIN) 100000 UNIT/ML suspension 5 ml orally 4 times per day for 14 days. Swish and swallow   No facility-administered medications prior to visit.    Review of Systems  Gastrointestinal:  Positive for diarrhea and vomiting.  Neurological:  Positive for headaches.     Objective    Blood pressure (!) 157/110, pulse (!) 101, height 5\' 2"  (1.575 m), weight 201 lb 8 oz (91.4 kg), SpO2 97 %.   Physical Exam Constitutional:      General: She is awake.     Appearance: She is well-developed.  HENT:     Head: Normocephalic.  Eyes:     Conjunctiva/sclera: Conjunctivae normal.  Cardiovascular:     Rate and Rhythm: Normal rate and regular rhythm.     Heart sounds: Normal heart sounds.  Pulmonary:     Effort: Pulmonary effort is normal.     Breath sounds: Normal breath sounds.  Skin:    General: Skin is warm.  Neurological:     Mental Status: She is alert and oriented to person, place, and time.  Psychiatric:        Attention and Perception: Attention normal.        Mood and Affect: Mood normal.        Speech: Speech normal.        Behavior: Behavior is cooperative.     No results found for any visits on 01/04/22.  Assessment & Plan     Problem List Items Addressed This Visit       Digestive   Nausea and vomiting - Primary    Likely secondary to recent abx use, with start of saxenda.  Advised pt to continue to increase fluids, no etoh, no fatty foods, small, plain meals.  rx zofran for nausea, can continue to use pepto bismol. Advised pepcid/tums otc; famotidine 20 mg BID.   If no improvement in symptoms would recommend d/c saxenda. Pt struggles w/ constipation and may have slower gastric motility that is not compatible w/ saxenda.      Relevant Medications   ondansetron (ZOFRAN) 4 MG tablet    2. Black tongue Secondary to pepto bismol use  No follow-ups on file.      I, Alfredia Ferguson, PA-C  have reviewed all documentation for this visit. The documentation on  01/07/2022  for the exam, diagnosis, procedures, and orders are all accurate and complete.  Alfredia Ferguson, PA-C [facility] [address] [place], [place], [address] Office: [phone] Fax: [phone]   [facility]

## 2022-01-04 NOTE — Patient Instructions (Signed)
Pepcid (famotidine) 20 mg in AM and PM

## 2022-01-07 ENCOUNTER — Encounter: Payer: Self-pay | Admitting: Physician Assistant

## 2022-01-07 DIAGNOSIS — R112 Nausea with vomiting, unspecified: Secondary | ICD-10-CM | POA: Insufficient documentation

## 2022-01-07 NOTE — Assessment & Plan Note (Signed)
Likely secondary to recent abx use, with start of saxenda.  Advised pt to continue to increase fluids, no etoh, no fatty foods, small, plain meals.  rx zofran for nausea, can continue to use pepto bismol. Advised pepcid/tums otc; famotidine 20 mg BID.   If no improvement in symptoms would recommend d/c saxenda. Pt struggles w/ constipation and may have slower gastric motility that is not compatible w/ saxenda.

## 2022-01-08 NOTE — Patient Instructions (Signed)
Your procedure is scheduled on: Wednesday January 16, 2022. Report to Day Surgery inside [facility] 2nd floor, stop by admissions desk before getting on elevator.  To find out your arrival time please call [phone] between 1PM - 3PM on Tuesday January 15, 2022.  Remember: Instructions that are not followed completely may result in serious medical risk,  up to and including death, or upon the discretion of your surgeon and anesthesiologist your  surgery may need to be rescheduled.     _X__ 1. Do not eat food after midnight the night before your procedure.                 No chewing gum or hard candies. You may drink clear liquids up to 2 hours                 before you are scheduled to arrive for your surgery- DO not drink clear                 liquids within 2 hours of the start of your surgery.                 Clear Liquids include:  water, apple juice without pulp, clear Gatorade, G2 or                  Gatorade Zero (avoid Red/Purple/Blue), Black Coffee or Tea (Do not add                 anything to coffee or tea).  __X__2.  On the morning of surgery brush your teeth with toothpaste and water, you                may rinse your mouth with mouthwash if you wish.  Do not swallow any toothpaste or mouthwash.     _X__ 3.  No Alcohol for 24 hours before or after surgery.   _X__ 4.  Do Not Smoke or use e-cigarettes For 24 Hours Prior to Your Surgery.                 Do not use any chewable tobacco products for at least 6 hours prior to                 Surgery.  _X__  5.  Do not use any recreational drugs (marijuana, cocaine, heroin, ecstasy, MDMA or other)                For at least one week prior to your surgery.  Combination of these drugs with anesthesia                May have life threatening results.  ____  6.  Bring all medications with you on the day of surgery if instructed.   __X__  7.  Notify your doctor if there is any change in your medical condition       (cold, fever, infections).     Do not wear jewelry, make-up, hairpins, clips or nail polish. Do not wear lotions, powders, or perfumes. You may wear deodorant. Do not shave 48 hours prior to surgery. Men may shave face and neck. Do not bring valuables to the hospital.    [facility] is not responsible for any belongings or valuables.  Contacts, dentures or bridgework may not be worn into surgery. Leave your suitcase in the car. After surgery it may be brought to your room. For patients admitted to the hospital, discharge time is  determined by your treatment team.   Patients discharged the day of surgery will not be allowed to drive home.   Make arrangements for someone to be with you for the first 24 hours of your Same Day Discharge.   __X__ Take these medicines the morning of surgery with A SIP OF WATER:    1. metoprolol succinate (TOPROL-XL) 25 MG  2.   3.   4.  5.  6.  __x__ Fleet Enema (as directed) (use one the night before procedure and one the morning of surgery as soon as you wake)   __x__ Use CHG Soap (or wipes) as directed  ____ Use Benzoyl Peroxide Gel as instructed  ____ Use inhalers on the day of surgery  ____ Stop metformin 2 days prior to surgery    ____ Take 1/2 of usual insulin dose the night before surgery. No insulin the morning          of surgery.   ____ Call your PCP, cardiologist, or Pulmonologist if taking Coumadin/Plavix/aspirin and ask when to stop before your surgery.   __x__ One Week prior to surgery- Stop Anti-inflammatories such as Ibuprofen, Aleve, Advil, Motrin, meloxicam (MOBIC), diclofenac, etodolac, ketorolac, Toradol, Daypro, piroxicam, Goody's or BC powders. OK TO USE TYLENOL IF NEEDED   __x__ Do not start any vitamins and or supplements until after surgery.    ____ Bring C-Pap to the hospital.    If you have any questions regarding your pre-procedure instructions,  Please call Pre-admit Testing at [phone]

## 2022-01-09 ENCOUNTER — Encounter
Admission: RE | Admit: 2022-01-09 | Discharge: 2022-01-09 | Disposition: A | Discharge status: Home / Self Care | Payer: 59 | Source: Ambulatory Visit | Attending: Surgery | Admitting: Surgery

## 2022-01-09 VITALS — Ht 62.0 in | Wt 197.0 lb

## 2022-01-09 DIAGNOSIS — R7303 Prediabetes: Secondary | ICD-10-CM

## 2022-01-09 DIAGNOSIS — E669 Obesity, unspecified: Secondary | ICD-10-CM

## 2022-01-09 DIAGNOSIS — I10 Essential (primary) hypertension: Secondary | ICD-10-CM

## 2022-01-09 DIAGNOSIS — Z6837 Body mass index (BMI) 37.0-37.9, adult: Secondary | ICD-10-CM

## 2022-01-09 DIAGNOSIS — Z01812 Encounter for preprocedural laboratory examination: Secondary | ICD-10-CM

## 2022-01-09 HISTORY — DX: Anxiety disorder, unspecified: F41.9

## 2022-01-09 HISTORY — DX: Sleep apnea, unspecified: G47.30

## 2022-01-09 HISTORY — DX: Personal history of urinary calculi: Z87.442

## 2022-01-09 HISTORY — DX: Prediabetes: R73.03

## 2022-01-11 ENCOUNTER — Encounter
Admission: RE | Admit: 2022-01-11 | Discharge: 2022-01-11 | Disposition: A | Discharge status: Home / Self Care | Payer: 59 | Source: Ambulatory Visit | Attending: Surgery | Admitting: Surgery

## 2022-01-11 ENCOUNTER — Other Ambulatory Visit: Payer: Self-pay

## 2022-01-11 DIAGNOSIS — Z01818 Encounter for other preprocedural examination: Secondary | ICD-10-CM | POA: Diagnosis not present

## 2022-01-11 DIAGNOSIS — Z6837 Body mass index (BMI) 37.0-37.9, adult: Secondary | ICD-10-CM | POA: Insufficient documentation

## 2022-01-11 DIAGNOSIS — E669 Obesity, unspecified: Secondary | ICD-10-CM | POA: Insufficient documentation

## 2022-01-11 DIAGNOSIS — I10 Essential (primary) hypertension: Secondary | ICD-10-CM | POA: Insufficient documentation

## 2022-01-11 DIAGNOSIS — R7303 Prediabetes: Secondary | ICD-10-CM | POA: Diagnosis not present

## 2022-01-11 LAB — CBC
HCT: 43.7 % (ref 36.0–46.0)
Hemoglobin: 14 g/dL (ref 12.0–15.0)
MCH: 28 pg (ref 26.0–34.0)
MCHC: 32 g/dL (ref 30.0–36.0)
MCV: 87.4 fL (ref 80.0–100.0)
Platelets: 333 10*3/uL (ref 150–400)
RBC: 5 MIL/uL (ref 3.87–5.11)
RDW: 14.2 % (ref 11.5–15.5)
WBC: 7.4 10*3/uL (ref 4.0–10.5)
nRBC: 0 % (ref 0.0–0.2)

## 2022-01-11 LAB — BASIC METABOLIC PANEL
Anion gap: 6 (ref 5–15)
BUN: 11 mg/dL (ref 6–20)
CO2: 26 mmol/L (ref 22–32)
Calcium: 9.2 mg/dL (ref 8.9–10.3)
Chloride: 107 mmol/L (ref 98–111)
Creatinine, Ser: 0.75 mg/dL (ref 0.44–1.00)
GFR, Estimated: >60 mL/min (ref >60)
Glucose, Bld: 93 mg/dL (ref 70–99)
Potassium: 3.7 mmol/L (ref 3.5–5.1)
Sodium: 139 mmol/L (ref 135–145)

## 2022-01-15 MED ORDER — GABAPENTIN 300 MG PO CAPS: 300.0000 mg | ORAL_CAPSULE | ORAL | Status: AC

## 2022-01-15 MED ORDER — FLEET ENEMA 7-19 GM/118ML RE ENEM
1.0000 | ENEMA | Freq: Once | RECTAL | Status: AC
  Administered 2022-01-16: 1 via RECTAL

## 2022-01-15 MED ORDER — FAMOTIDINE 20 MG PO TABS: 20.0000 mg | ORAL_TABLET | Freq: Once | ORAL | Status: AC

## 2022-01-15 MED ORDER — ORAL CARE MOUTH RINSE: 15.0000 mL | Freq: Once | OROMUCOSAL | Status: AC

## 2022-01-15 MED ORDER — SODIUM CHLORIDE 0.9 % IV SOLN
2.0000 g | INTRAVENOUS | Status: AC
  Administered 2022-01-16: 2 g via INTRAVENOUS

## 2022-01-15 MED ORDER — ACETAMINOPHEN 500 MG PO TABS
1000.0000 mg | ORAL_TABLET | ORAL | Status: AC
  Administered 2022-01-16: 1000 mg via ORAL

## 2022-01-15 MED ORDER — FLEET ENEMA 7-19 GM/118ML RE ENEM: 1.0000 | ENEMA | Freq: Once | RECTAL | Status: DC

## 2022-01-15 MED ORDER — CHLORHEXIDINE GLUCONATE CLOTH 2 % EX PADS: 6.0000 | MEDICATED_PAD | Freq: Once | CUTANEOUS | Status: DC

## 2022-01-15 MED ORDER — BUPIVACAINE LIPOSOME 1.3 % IJ SUSP: 20.0000 mL | Freq: Once | INTRAMUSCULAR | Status: DC

## 2022-01-15 MED ORDER — LACTATED RINGERS IV SOLN: INTRAVENOUS | Status: DC

## 2022-01-15 MED ORDER — CHLORHEXIDINE GLUCONATE 0.12 % MT SOLN: 15.0000 mL | Freq: Once | OROMUCOSAL | Status: AC

## 2022-01-15 NOTE — Anesthesia Preprocedure Evaluation (Addendum)
Anesthesia Evaluation  Patient identified by MRN, date of birth, ID band Patient awake    Reviewed: Allergy & Precautions, NPO status , Patient's Chart, lab work & pertinent test results  Airway Mallampati: II  TM Distance: >3 FB Neck ROM: full    Dental  (+) Teeth Intact   Pulmonary sleep apnea and Continuous Positive Airway Pressure Ventilation ,    Pulmonary exam normal breath sounds clear to auscultation       Cardiovascular Exercise Tolerance: Good hypertension, Pt. on medications Normal cardiovascular exam Rhythm:Regular Rate:Normal     Neuro/Psych  Headaches, PSYCHIATRIC DISORDERS Anxiety Depression    GI/Hepatic negative GI ROS, Neg liver ROS,   Endo/Other  Pre-diabetes  Renal/GU negative Renal ROS  negative genitourinary   Musculoskeletal negative musculoskeletal ROS (+)   Abdominal (+) + obese,   Peds negative pediatric ROS (+)  Hematology negative hematology ROS (+)   Anesthesia Other Findings Past Medical History: No date: Hyperlipidemia No date: Hypertension No date: Migraine  Past Surgical History: 10/08/2013: HEMORROIDECTOMY  BMI    Body Mass Index: 35.67 kg/m      Reproductive/Obstetrics negative OB ROS                             Anesthesia Physical  Anesthesia Plan  ASA: 2  Anesthesia Plan: General   Post-op Pain Management: Gabapentin PO (pre-op)*, Tylenol PO (pre-op)* and Toradol IV (intra-op)*   Induction: Intravenous  PONV Risk Score and Plan: 3 and Ondansetron, Dexamethasone and Midazolam  Airway Management Planned: LMA  Additional Equipment:   Intra-op Plan:   Post-operative Plan: Extubation in OR  Informed Consent: I have reviewed the patients History and Physical, chart, labs and discussed the procedure including the risks, benefits and alternatives for the proposed anesthesia with the patient or authorized representative who has  indicated his/her understanding and acceptance.     Dental Advisory Given  Plan Discussed with: CRNA and Surgeon  Anesthesia Plan Comments:       Anesthesia Quick Evaluation

## 2022-01-16 ENCOUNTER — Ambulatory Visit
Admission: RE | Admit: 2022-01-16 | Discharge: 2022-01-16 | Disposition: A | Discharge status: Home / Self Care | Payer: 59 | Source: Ambulatory Visit | Attending: Surgery | Admitting: Surgery

## 2022-01-16 ENCOUNTER — Other Ambulatory Visit: Payer: Self-pay

## 2022-01-16 ENCOUNTER — Ambulatory Visit: Payer: 59 | Admitting: Anesthesiology

## 2022-01-16 ENCOUNTER — Encounter: Payer: Self-pay | Admitting: Surgery

## 2022-01-16 ENCOUNTER — Encounter
Admission: RE | Disposition: A | Discharge status: Home / Self Care | Payer: Self-pay | Source: Ambulatory Visit | Attending: Surgery

## 2022-01-16 ENCOUNTER — Ambulatory Visit: Payer: 59 | Admitting: Urgent Care

## 2022-01-16 DIAGNOSIS — Z9889 Other specified postprocedural states: Secondary | ICD-10-CM | POA: Diagnosis not present

## 2022-01-16 DIAGNOSIS — K5900 Constipation, unspecified: Secondary | ICD-10-CM | POA: Insufficient documentation

## 2022-01-16 DIAGNOSIS — K644 Residual hemorrhoidal skin tags: Secondary | ICD-10-CM

## 2022-01-16 DIAGNOSIS — G4730 Sleep apnea, unspecified: Secondary | ICD-10-CM | POA: Diagnosis not present

## 2022-01-16 DIAGNOSIS — Z79899 Other long term (current) drug therapy: Secondary | ICD-10-CM | POA: Diagnosis not present

## 2022-01-16 DIAGNOSIS — I10 Essential (primary) hypertension: Secondary | ICD-10-CM | POA: Insufficient documentation

## 2022-01-16 DIAGNOSIS — Z01812 Encounter for preprocedural laboratory examination: Secondary | ICD-10-CM

## 2022-01-16 DIAGNOSIS — K648 Other hemorrhoids: Secondary | ICD-10-CM | POA: Diagnosis not present

## 2022-01-16 HISTORY — PX: EVALUATION UNDER ANESTHESIA WITH HEMORRHOIDECTOMY: SHX5624

## 2022-01-16 LAB — POCT PREGNANCY, URINE: Preg Test, Ur: NEGATIVE

## 2022-01-16 SURGERY — EXAM UNDER ANESTHESIA WITH HEMORRHOIDECTOMY
Anesthesia: General | Site: Rectum

## 2022-01-16 MED ORDER — PROMETHAZINE HCL 25 MG/ML IJ SOLN: 6.2500 mg | INTRAMUSCULAR | Status: DC | PRN

## 2022-01-16 MED ORDER — DEXMEDETOMIDINE (PRECEDEX) IN NS 20 MCG/5ML (4 MCG/ML) IV SYRINGE
PREFILLED_SYRINGE | INTRAVENOUS | Status: DC | PRN
  Administered 2022-01-16 (×2): 8 ug via INTRAVENOUS

## 2022-01-16 MED ORDER — BUPIVACAINE LIPOSOME 1.3 % IJ SUSP
INTRAMUSCULAR | Status: AC
  Filled 2022-01-16: qty 20

## 2022-01-16 MED ORDER — KETOROLAC TROMETHAMINE 30 MG/ML IJ SOLN
INTRAMUSCULAR | Status: DC | PRN
  Administered 2022-01-16: 30 mg via INTRAVENOUS

## 2022-01-16 MED ORDER — ACETAMINOPHEN 10 MG/ML IV SOLN: 1000.0000 mg | Freq: Once | INTRAVENOUS | Status: DC | PRN

## 2022-01-16 MED ORDER — DROPERIDOL 2.5 MG/ML IJ SOLN: 0.6250 mg | Freq: Once | INTRAMUSCULAR | Status: DC | PRN

## 2022-01-16 MED ORDER — HYDROMORPHONE HCL 1 MG/ML IJ SOLN
INTRAMUSCULAR | Status: AC
  Filled 2022-01-16: qty 1

## 2022-01-16 MED ORDER — LIDOCAINE 5 % EX OINT
1.0000 | TOPICAL_OINTMENT | Freq: Four times a day (QID) | CUTANEOUS | 0 refills | Status: DC | PRN
  Filled 2022-01-16: qty 35.44, 9d supply, fill #0

## 2022-01-16 MED ORDER — OXYCODONE HCL 5 MG PO TABS
5.0000 mg | ORAL_TABLET | ORAL | 0 refills | Status: DC | PRN
  Filled 2022-01-16: qty 30, 5d supply, fill #0

## 2022-01-16 MED ORDER — IBUPROFEN 800 MG PO TABS
800.0000 mg | ORAL_TABLET | Freq: Three times a day (TID) | ORAL | 1 refills | Status: DC | PRN
  Filled 2022-01-16: qty 60, 20d supply, fill #0

## 2022-01-16 MED ORDER — BUPIVACAINE-EPINEPHRINE (PF) 0.5% -1:200000 IJ SOLN
INTRAMUSCULAR | Status: AC
  Filled 2022-01-16: qty 30

## 2022-01-16 MED ORDER — 0.9 % SODIUM CHLORIDE (POUR BTL) OPTIME
TOPICAL | Status: DC | PRN
  Administered 2022-01-16: 500 mL

## 2022-01-16 MED ORDER — DEXAMETHASONE SODIUM PHOSPHATE 10 MG/ML IJ SOLN
INTRAMUSCULAR | Status: AC
  Filled 2022-01-16: qty 1

## 2022-01-16 MED ORDER — SUGAMMADEX SODIUM 200 MG/2ML IV SOLN
INTRAVENOUS | Status: DC | PRN
  Administered 2022-01-16: 200 mg via INTRAVENOUS

## 2022-01-16 MED ORDER — PROPOFOL 10 MG/ML IV BOLUS
INTRAVENOUS | Status: AC
  Filled 2022-01-16: qty 20

## 2022-01-16 MED ORDER — OXYCODONE HCL 5 MG PO TABS
ORAL_TABLET | ORAL | Status: AC
  Filled 2022-01-16: qty 1

## 2022-01-16 MED ORDER — GELATIN ABSORBABLE 100 EX MISC
CUTANEOUS | Status: DC | PRN
  Administered 2022-01-16: 1 via TOPICAL

## 2022-01-16 MED ORDER — DEXAMETHASONE SODIUM PHOSPHATE 10 MG/ML IJ SOLN
INTRAMUSCULAR | Status: DC | PRN
  Administered 2022-01-16: 10 mg via INTRAVENOUS

## 2022-01-16 MED ORDER — FENTANYL CITRATE (PF) 100 MCG/2ML IJ SOLN
INTRAMUSCULAR | Status: DC | PRN
Start: 2022-01-16 — End: 2022-01-16
  Administered 2022-01-16 (×2): 50 ug via INTRAVENOUS

## 2022-01-16 MED ORDER — GABAPENTIN 300 MG PO CAPS
ORAL_CAPSULE | ORAL | Status: AC
  Administered 2022-01-16: 300 mg via ORAL
  Filled 2022-01-16: qty 1

## 2022-01-16 MED ORDER — SODIUM CHLORIDE 0.9 % IV SOLN
INTRAVENOUS | Status: AC
  Filled 2022-01-16: qty 2

## 2022-01-16 MED ORDER — MIDAZOLAM HCL 2 MG/2ML IJ SOLN
INTRAMUSCULAR | Status: AC
  Filled 2022-01-16: qty 2

## 2022-01-16 MED ORDER — ONDANSETRON HCL 4 MG/2ML IJ SOLN
INTRAMUSCULAR | Status: AC
  Filled 2022-01-16: qty 2

## 2022-01-16 MED ORDER — HYDROMORPHONE HCL 1 MG/ML IJ SOLN
INTRAMUSCULAR | Status: DC | PRN
  Administered 2022-01-16 (×2): .5 mg via INTRAVENOUS

## 2022-01-16 MED ORDER — BUPIVACAINE-EPINEPHRINE 0.5% -1:200000 IJ SOLN
INTRAMUSCULAR | Status: DC | PRN
  Administered 2022-01-16: 50 mL via INTRAMUSCULAR

## 2022-01-16 MED ORDER — PROPOFOL 10 MG/ML IV BOLUS
INTRAVENOUS | Status: DC | PRN
  Administered 2022-01-16: 150 mg via INTRAVENOUS

## 2022-01-16 MED ORDER — FENTANYL CITRATE (PF) 100 MCG/2ML IJ SOLN
INTRAMUSCULAR | Status: AC
  Filled 2022-01-16: qty 2

## 2022-01-16 MED ORDER — CHLORHEXIDINE GLUCONATE 0.12 % MT SOLN
OROMUCOSAL | Status: AC
  Administered 2022-01-16: 15 mL via OROMUCOSAL
  Filled 2022-01-16: qty 15

## 2022-01-16 MED ORDER — ACETAMINOPHEN 500 MG PO TABS
ORAL_TABLET | ORAL | Status: AC
  Filled 2022-01-16: qty 2

## 2022-01-16 MED ORDER — OXYCODONE HCL 5 MG/5ML PO SOLN: 5.0000 mg | Freq: Once | ORAL | Status: AC | PRN

## 2022-01-16 MED ORDER — GELATIN ABSORBABLE 100 CM EX MISC
CUTANEOUS | Status: AC
  Filled 2022-01-16: qty 1

## 2022-01-16 MED ORDER — MIDAZOLAM HCL 2 MG/2ML IJ SOLN
INTRAMUSCULAR | Status: DC | PRN
  Administered 2022-01-16 (×2): 1 mg via INTRAVENOUS

## 2022-01-16 MED ORDER — OXYCODONE HCL 5 MG PO TABS
5.0000 mg | ORAL_TABLET | Freq: Once | ORAL | Status: AC | PRN
  Administered 2022-01-16: 5 mg via ORAL

## 2022-01-16 MED ORDER — ROCURONIUM BROMIDE 100 MG/10ML IV SOLN
INTRAVENOUS | Status: DC | PRN
  Administered 2022-01-16: 50 mg via INTRAVENOUS

## 2022-01-16 MED ORDER — ONDANSETRON HCL 4 MG/2ML IJ SOLN
INTRAMUSCULAR | Status: DC | PRN
  Administered 2022-01-16: 4 mg via INTRAVENOUS

## 2022-01-16 MED ORDER — LIDOCAINE HCL (CARDIAC) PF 100 MG/5ML IV SOSY
PREFILLED_SYRINGE | INTRAVENOUS | Status: DC | PRN
  Administered 2022-01-16: 80 mg via INTRAVENOUS

## 2022-01-16 MED ORDER — LIDOCAINE HCL (PF) 2 % IJ SOLN
INTRAMUSCULAR | Status: AC
  Filled 2022-01-16: qty 5

## 2022-01-16 MED ORDER — FENTANYL CITRATE (PF) 100 MCG/2ML IJ SOLN: 25.0000 ug | INTRAMUSCULAR | Status: DC | PRN

## 2022-01-16 MED ORDER — FAMOTIDINE 20 MG PO TABS
ORAL_TABLET | ORAL | Status: AC
  Administered 2022-01-16: 20 mg via ORAL
  Filled 2022-01-16: qty 1

## 2022-01-16 MED ORDER — ACETAMINOPHEN 500 MG PO TABS: 1000.0000 mg | ORAL_TABLET | Freq: Four times a day (QID) | ORAL | Status: AC | PRN

## 2022-01-16 SURGICAL SUPPLY — 33 items
DRAPE PERI LITHO V/GYN (MISCELLANEOUS) ×2 IMPLANT
DRAPE UNDER BUTTOCK W/FLU (DRAPES) ×2 IMPLANT
DRSG GAUZE FLUFF 36X18 (GAUZE/BANDAGES/DRESSINGS) ×2 IMPLANT
ELECT CAUTERY BLADE TIP 2.5 (TIP) ×2
ELECT REM PT RETURN 9FT ADLT (ELECTROSURGICAL) ×2
ELECTRODE CAUTERY BLDE TIP 2.5 (TIP) ×1 IMPLANT
ELECTRODE REM PT RTRN 9FT ADLT (ELECTROSURGICAL) ×1 IMPLANT
GAUZE 4X4 16PLY ~~LOC~~+RFID DBL (SPONGE) ×2 IMPLANT
GLOVE SURG SYN 7.0 (GLOVE) ×2 IMPLANT
GLOVE SURG SYN 7.0 PF PI (GLOVE) ×1 IMPLANT
GLOVE SURG SYN 7.5  E (GLOVE) ×1
GLOVE SURG SYN 7.5 E (GLOVE) ×1 IMPLANT
GLOVE SURG SYN 7.5 PF PI (GLOVE) ×1 IMPLANT
GOWN STRL REUS W/ TWL LRG LVL3 (GOWN DISPOSABLE) ×2 IMPLANT
GOWN STRL REUS W/TWL LRG LVL3 (GOWN DISPOSABLE) ×2
KIT TURNOVER KIT A (KITS) ×2 IMPLANT
LABEL OR SOLS (LABEL) ×2 IMPLANT
MANIFOLD NEPTUNE II (INSTRUMENTS) ×2 IMPLANT
NEEDLE HYPO 22GX1.5 SAFETY (NEEDLE) ×2 IMPLANT
NS IRRIG 500ML POUR BTL (IV SOLUTION) ×2 IMPLANT
PACK BASIN MINOR ARMC (MISCELLANEOUS) ×2 IMPLANT
PAD OB MATERNITY 4.3X12.25 (PERSONAL CARE ITEMS) ×2 IMPLANT
PAD PREP 24X41 OB/GYN DISP (PERSONAL CARE ITEMS) ×2 IMPLANT
PANTS MESH DISP 2XL (UNDERPADS AND DIAPERS) ×2 IMPLANT
SHEARS HARMONIC 9CM CVD (BLADE) ×2 IMPLANT
SOL PREP PVP 2OZ (MISCELLANEOUS) ×2
SOLUTION PREP PVP 2OZ (MISCELLANEOUS) ×1 IMPLANT
SURGILUBE 2OZ TUBE FLIPTOP (MISCELLANEOUS) ×2 IMPLANT
SUT VIC AB 2-0 SH 27 (SUTURE) ×2
SUT VIC AB 2-0 SH 27XBRD (SUTURE) ×2 IMPLANT
SYR 10ML LL (SYRINGE) ×2 IMPLANT
SYR BULB IRRIG 60ML STRL (SYRINGE) ×2 IMPLANT
WATER STERILE IRR 500ML POUR (IV SOLUTION) ×2 IMPLANT

## 2022-01-16 NOTE — Transfer of Care (Signed)
Immediate Anesthesia Transfer of Care Note  Patient: Audrey Koch  Procedure(s) Performed: EXAM UNDER ANESTHESIA WITH HEMORRHOIDECTOMY of 2 + columns (Rectum)  Patient Location: PACU  Anesthesia Type:General  Level of Consciousness: drowsy  Airway & Oxygen Therapy: Patient Spontanous Breathing and Patient connected to face mask oxygen  Post-op Assessment: Report given to RN and Post -op Vital signs reviewed and stable  Post vital signs: Reviewed and stable  Last Vitals:  Vitals Value Taken Time  BP 108/58 01/16/22 1032  Temp 36.9 C 01/16/22 1030  Pulse 98 01/16/22 1032  Resp 12 01/16/22 1032  SpO2 97 % 01/16/22 1032  Vitals shown include unvalidated device data.  Last Pain:  Vitals:   01/16/22 0833  TempSrc: Temporal  PainSc: 2          Complications: No notable events documented.

## 2022-01-16 NOTE — Discharge Instructions (Signed)

## 2022-01-16 NOTE — Op Note (Signed)
Procedure Date:  01/16/2022  Pre-operative Diagnosis:  Enlarged internal and external hemorrhoids  Post-operative Diagnosis: Enlarged internal and external hemorrhoids  Procedure:  Exam under Anesthesia, Hemorrhoidectomy of three columns  Surgeon:  Melvyn Neth, MD  Anesthesia:  General endotracheal  Estimated Blood Loss:  10 ml  Specimens: Right anterior column Right posterior column Left lateral column  Complications:  None  Indications for Procedure:  This is a 51 y.o. female with a long history of enlarged internal and external hemorrhoids, which have been given her difficulties with hygiene resulting in skin excoriation and pruritus ani.  She presents now for hemorrhoidectomy.  The risks of bleeding, infection, bowel injury, and need for further procedures were all discussed with the patient and she was willing to proceed.   Description of Procedure: The patient was correctly identified in the preoperative area and brought into the operating room.  The patient was placed supine with VTE prophylaxis in place.  Appropriate time-outs were performed.  Anesthesia was induced and the patient was intubated.  Appropriate antibiotics were infused.  The patient was then placed in high lithotomy position.   The perianal area was prepped and draped in usual sterile fashion.  The sphincter was digitally dilated.  Then the anoscope was inserted and the anal canal was evaluated, revealing enlarged internal and external hemorrhoids of all three columns.  The bivalve retractor was then inserted, and we proceeded with excision, starring with the left lateral column.  The skin of the external tissue was incised with cautery and dissected down to the sphincter muscle, with careful attention to preserve the muscle.  Then, Harmonic was used to take down the internal component down to the origin of the pedicle.  In same fashion, the right posterior column was taken, followed by the right anterior column.   No injury to muscle was noted.  Cautery was used to control any oozing.  The anal canal was irrigated and 50 ml of Exparel solution was infiltrated into the perianal area and as bilateral pudendal blocks.  A large gelfoam gauze was rolled and inserted into the anal canal for further hemostasis.  The perianal area was cleaned and dressed with fluffed gauze and mesh underwear.  The patient was emerged from anesthesia and extubated and brought to the recovery room for further management.  The patient tolerated the procedure well and all counts were correct at the end of the case.   Melvyn Neth, MD

## 2022-01-16 NOTE — Anesthesia Postprocedure Evaluation (Signed)
Anesthesia Post Note  Patient: Audrey Koch  Procedure(s) Performed: EXAM UNDER ANESTHESIA WITH HEMORRHOIDECTOMY of 2 + columns (Rectum)  Patient location during evaluation: PACU Anesthesia Type: General Level of consciousness: awake and alert Pain management: pain level controlled Vital Signs Assessment: post-procedure vital signs reviewed and stable Respiratory status: spontaneous breathing, nonlabored ventilation and respiratory function stable Cardiovascular status: blood pressure returned to baseline and stable Postop Assessment: no apparent nausea or vomiting Anesthetic complications: no   No notable events documented.   Last Vitals:  Vitals:   01/16/22 1100 01/16/22 1122  BP: 135/76 (!) 141/91  Pulse: 93 99  Resp: 13 16  Temp: 36.4 C (!) 36.3 C  SpO2: 96% 96%    Last Pain:  Vitals:   01/16/22 1122  TempSrc: Temporal  PainSc: 0-No pain                 [place] Ouch

## 2022-01-16 NOTE — Anesthesia Procedure Notes (Addendum)
Procedure Name: Intubation Date/Time: 01/16/2022 9:14 AM  Performed by: Loletha Grayer, CRNAPre-anesthesia Checklist: Patient identified, Patient being monitored, Timeout performed, Emergency Drugs available and Suction available Patient Re-evaluated:Patient Re-evaluated prior to induction Oxygen Delivery Method: Circle system utilized Preoxygenation: Pre-oxygenation with 100% oxygen Induction Type: IV induction Ventilation: Mask ventilation without difficulty Laryngoscope Size: Miller and 2 Grade View: Grade I Tube type: Oral Tube size: 7.0 mm Number of attempts: 1 Airway Equipment and Method: Stylet and LTA kit utilized Placement Confirmation: ETT inserted through vocal cords under direct vision, positive ETCO2 and breath sounds checked- equal and bilateral Secured at: 21 cm Tube secured with: Tape Dental Injury: Teeth and Oropharynx as per pre-operative assessment

## 2022-01-16 NOTE — Interval H&P Note (Signed)
History and Physical Interval Note:  01/16/2022 8:59 AM  Audrey Koch  has presented today for surgery, with the diagnosis of External hemorrhoids.  The various methods of treatment have been discussed with the patient and family. After consideration of risks, benefits and other options for treatment, the patient has consented to  Procedure(s): EXAM UNDER ANESTHESIA WITH HEMORRHOIDECTOMY of 2 + columns (N/A) as a surgical intervention.  The patient's history has been reviewed, patient examined, no change in status, stable for surgery.  I have reviewed the patient's chart and labs.  Questions were answered to the patient's satisfaction.     Roselle Norton

## 2022-01-17 LAB — SURGICAL PATHOLOGY

## 2022-01-18 DIAGNOSIS — G4733 Obstructive sleep apnea (adult) (pediatric): Secondary | ICD-10-CM | POA: Diagnosis not present

## 2022-01-21 ENCOUNTER — Other Ambulatory Visit: Payer: Self-pay

## 2022-01-21 ENCOUNTER — Other Ambulatory Visit: Payer: Self-pay | Admitting: Physician Assistant

## 2022-01-21 DIAGNOSIS — Z85828 Personal history of other malignant neoplasm of skin: Secondary | ICD-10-CM | POA: Diagnosis not present

## 2022-01-21 DIAGNOSIS — I10 Essential (primary) hypertension: Secondary | ICD-10-CM

## 2022-01-21 DIAGNOSIS — L720 Epidermal cyst: Secondary | ICD-10-CM | POA: Diagnosis not present

## 2022-01-21 DIAGNOSIS — D225 Melanocytic nevi of trunk: Secondary | ICD-10-CM | POA: Diagnosis not present

## 2022-01-21 DIAGNOSIS — D2271 Melanocytic nevi of right lower limb, including hip: Secondary | ICD-10-CM | POA: Diagnosis not present

## 2022-01-21 MED ORDER — METOPROLOL SUCCINATE ER 25 MG PO TB24
ORAL_TABLET | Freq: Every day | ORAL | 1 refills | Status: DC
  Filled 2022-01-21: qty 90, 90d supply, fill #0
  Filled 2022-04-25: qty 90, 90d supply, fill #1

## 2022-01-30 ENCOUNTER — Encounter: Payer: Self-pay | Admitting: Surgery

## 2022-01-30 ENCOUNTER — Ambulatory Visit (INDEPENDENT_AMBULATORY_CARE_PROVIDER_SITE_OTHER): Payer: 59 | Admitting: Surgery

## 2022-01-30 VITALS — BP 131/84 | HR 105 | Temp 98.6°F | Ht 62.0 in | Wt 196.0 lb

## 2022-01-30 DIAGNOSIS — Z09 Encounter for follow-up examination after completed treatment for conditions other than malignant neoplasm: Secondary | ICD-10-CM

## 2022-01-30 DIAGNOSIS — K644 Residual hemorrhoidal skin tags: Secondary | ICD-10-CM

## 2022-01-30 NOTE — Patient Instructions (Addendum)
Continue with the sitz baths 2-3 times a day for comfort and healing, especially after bowel movements.  You may take Ibuprofen for any pain/discomfort. You may use the Anusol to the area if you would like to help with inflammation.   Continue to take your Miralax daily to keep your stools soft.    Follow up here in 3 weeks.

## 2022-01-30 NOTE — Progress Notes (Signed)
01/30/2022  HPI: Audrey Koch is a 51 y.o. female s/p exam under anesthesia and hemorrhoidectomy of all three columns on 01/16/22.  Patient presents today for follow up.  She reports her pain intensified after the Exparel wore off, and more recently has been getting easier.  She has been doing Sitz baths and taking Miralax, though she forgot it one day and resulted in some more pain with her BM.  Vital signs: BP 131/84   Pulse (!) 105   Temp 98.6 F (37 C)   Ht '5\' 2"'$  (1.575 m)   Wt 196 lb (88.9 kg)   LMP 01/04/2022 (Approximate)   SpO2 98%   BMI 35.85 kg/m    Physical Exam: Constitutional: No acute distress Rectal:  external exam reveals healing tissue in the left and posterior columns, with some swelling of the anterior component, but healing all around.  There's more soreness in the anterior component which is consistent with the swelling noted.  Assessment/Plan: This is a 51 y.o. female s/p EUA and hemorrhoidectomy  --Discussed with the patient that the tissue is healing appropriately, with an area of swelling anteriorly that could be related to the time she had to strain more without the MiraLax.  Recommended that she continue with the Sitz baths to help soothe the tissue, can use the Lidocaine ointment, and can start applying Anusol ointment over the anterior swollen area to help with the inflammation and pain from it.   --Follow up in 3 weeks.   Melvyn Neth, [facility]

## 2022-02-11 ENCOUNTER — Telehealth: Payer: Self-pay | Admitting: *Deleted

## 2022-02-11 NOTE — Telephone Encounter (Signed)
Faxed FMLA to [employer] at [phone]

## 2022-02-20 ENCOUNTER — Ambulatory Visit (INDEPENDENT_AMBULATORY_CARE_PROVIDER_SITE_OTHER): Payer: 59 | Admitting: Surgery

## 2022-02-20 ENCOUNTER — Encounter: Payer: Self-pay | Admitting: Surgery

## 2022-02-20 VITALS — BP 129/86 | HR 85 | Temp 98.0°F | Ht 62.0 in | Wt 195.0 lb

## 2022-02-20 DIAGNOSIS — Z09 Encounter for follow-up examination after completed treatment for conditions other than malignant neoplasm: Secondary | ICD-10-CM

## 2022-02-20 DIAGNOSIS — K648 Other hemorrhoids: Secondary | ICD-10-CM

## 2022-02-20 DIAGNOSIS — K644 Residual hemorrhoidal skin tags: Secondary | ICD-10-CM

## 2022-02-20 NOTE — Patient Instructions (Addendum)
If you have any concerns or questions, please feel free to call our office. Follow up as needed.   Surgical Procedures for Hemorrhoids, Care After This sheet gives you information about how to care for yourself after your procedure. Your health care provider may also give you more specific instructions. If you have problems or questions, contact your health care provider. What can I expect after the procedure? After the procedure, it is common to have: Rectal pain. Pain when you are having a bowel movement. Slight rectal bleeding. This is more likely to happen with the first bowel movement after surgery. Follow these instructions at home: Medicines Take over-the-counter and prescription medicines only as told by your health care provider. If you were prescribed an antibiotic medicine, use it as told by your health care provider. Do not stop using the antibiotic even if your condition improves. Ask your health care provider if the medicine prescribed to you requires you to avoid driving or using heavy machinery. Use a stool softener or a bulk laxative as told by your health care provider. Eating and drinking Follow instructions from your health care provider about what to eat or drink after your procedure. You may need to take actions to prevent or treat constipation, such as: Drink enough fluid to keep your urine pale yellow. Take over-the-counter or prescription medicines. Eat foods that are high in fiber, such as beans, whole grains, and fresh fruits and vegetables. Limit foods that are high in fat and processed sugars, such as fried or sweet foods. Activity  Rest as told by your health care provider. Avoid sitting for a long time without moving. Get up to take short walks every 1-2 hours. This is important to improve blood flow and breathing. Ask for help if you feel weak or unsteady. Return to your normal activities as told by your health care provider. Ask your health care provider  what activities are safe for you. Do not lift anything that is heavier than 10 lb (4.5 kg), or the limit that you are told, until your health care provider says that it is safe. Do not strain to have a bowel movement. Do not spend a long time sitting on the toilet. General instructions  Take warm sitz baths for 15-20 minutes, 2-3 times a day to relieve soreness or itching and to keep the rectal area clean. Apply ice packs to the area to reduce swelling and pain. Do not drive for 24 hours if you were given a sedative during your procedure. Keep all follow-up visits as told by your health care provider. This is important. Contact a health care provider if: Your pain medicine is not helping. You have a fever or chills. You have bad smelling drainage. You have a lot of swelling. You become constipated. You have trouble passing urine. Get help right away if: You have very bad rectal pain. You have heavy bleeding from your rectum. Summary After the procedure, it is common to have pain and slight rectal bleeding. Take warm sitz baths for 15-20 minutes, 2-3 times a day to relieve soreness or itching and to keep the rectal area clean. Avoid straining when having a bowel movement. Eat foods that are high in fiber, such as beans, whole grains, and fresh fruits and vegetables. Take over-the-counter and prescription medicines only as told by your health care provider. This information is not intended to replace advice given to you by your health care provider. Make sure you discuss any questions you have with your  health care provider. Document Revised: 01/24/2021 Document Reviewed: 01/24/2021 Elsevier Patient Education  [place].

## 2022-02-20 NOTE — Progress Notes (Signed)
02/20/2022  HPI: Audrey Koch is a 51 y.o. female s/p EUA and hemorrhoidectomy of 3 columns on 01/16/22.  Patient presents for follow up.  She reports that the discomfort with bowel movements is improving.  Sometimes she's having more stool urgency that she has to rush to a bathroom.  She's been alternating between MiraLax and a magnesium supplement for her bowel regimen.  Has not been adding fiber yet.  She also notes some drainage on the pad, but this has also been improving.  Vital signs: BP 129/86   Pulse 85   Temp 98 F (36.7 C) (Oral)   Ht '5\' 2"'$  (1.575 m)   Wt 195 lb (88.5 kg)   SpO2 95%   BMI 35.67 kg/m    Physical Exam: Constitutional:  No acute distress. Rectal:  External exam reveals healing tissue throughout all columns.  There's very shallow open wounds still, but without any evidence of infection or inflammation.  Digital rectal exam does not reveal any masses, but there's some mild tightness of the anal tone.  Assessment/Plan: This is a 51 y.o. female s/p EUA and hemorrhoidectomy.  --Discussed with the patient that the discomfort may be related to still having the open wounds, though they continue to heal and are much more shallow compared to the last visit.  It could also be some some scarring that's forming, which should improve as the healing continues.  Advised that she start adding fiber supplement to her diet to see if this helps with the stool so it's less loose so hopefully there will be less urgency. --For now, ok to follow up as needed, but return precautions given.   Melvyn Neth, [facility]

## 2022-03-05 ENCOUNTER — Other Ambulatory Visit: Payer: Self-pay | Admitting: Physician Assistant

## 2022-03-05 ENCOUNTER — Other Ambulatory Visit: Payer: Self-pay

## 2022-03-05 DIAGNOSIS — Z6837 Body mass index (BMI) 37.0-37.9, adult: Secondary | ICD-10-CM

## 2022-03-06 NOTE — Telephone Encounter (Signed)
Requested medications are due for refill today.  unsure  Requested medications are on the active medications list.  yes  Last refill. 12/25/2021 !5 mL 1 refill  Future visit scheduled.   yes  Notes to clinic.  Please update Sig.    Requested Prescriptions  Pending Prescriptions Disp Refills   Liraglutide -Weight Management (SAXENDA) 18 MG/3ML SOPN 15 mL 1    Sig: Inject 0.6 mg into the skin daily. For one week. Then increase to 1.2 mg daily for one week. Then increase to 1.8 mg daily.     Endocrinology:  Diabetes - GLP-1 Receptor Agonists Failed - 03/05/2022  5:44 PM      Failed - HBA1C is between 0 and 7.9 and within 180 days    Hgb A1c MFr Bld  Date Value Ref Range Status  07/06/2021 5.8 (H) 4.8 - 5.6 % Final    Comment:             Prediabetes: 5.7 - 6.4          Diabetes: >6.4          Glycemic control for adults with diabetes: <7.0          Passed - Valid encounter within last 6 months    Recent Outpatient Visits           2 months ago Nausea and vomiting, unspecified vomiting type   [facility] Mikey Kirschner, PA-C   2 months ago Essential hypertension   [facility] Thedore Mins, [place], PA-C   8 months ago Annual physical exam   [facility] [place], Dionne Bucy, MD   10 months ago [facility] Jerrol Banana., MD   12 months ago External hemorrhoid   [facility] [place], Dionne Bucy, MD       Future Appointments             In 1 week Thedore Mins, Ria Comment, PA-C [employer], [place]   In 4 months Bacigalupo, Dionne Bucy, MD [facility], [place]

## 2022-03-07 ENCOUNTER — Other Ambulatory Visit: Payer: Self-pay

## 2022-03-07 MED ORDER — SAXENDA 18 MG/3ML ~~LOC~~ SOPN
PEN_INJECTOR | SUBCUTANEOUS | 1 refills | Status: DC
  Filled 2022-03-07: qty 15, 30d supply, fill #0

## 2022-03-14 NOTE — Progress Notes (Unsigned)
I,Sulibeya S Dimas,acting as a Education administrator for [employer], PA-C.,have documented all relevant documentation on the behalf of Mikey Kirschner, PA-C,as directed by  Mikey Kirschner, PA-C while in the presence of Mikey Kirschner, PA-C.   Established patient visit   Patient: Audrey Koch   DOB: Feb 08, 1971   51 y.o. Female  MRN: [id number] Visit Date: 03/15/2022  Today's healthcare provider: Mikey Kirschner, PA-C   Chief Complaint  Patient presents with   Obesity   Hypertension   Subjective    HPI  Diet Not seeing changes Advvised trying another 3 mo  Cant see benefit to switch to wegovy Follow up for weight management  The patient was last seen for this 3 months ago. Changes made at last visit include start [facility].  Wegovy not in stock. Patient started on Saxenda.     For one week. Then increase to 1.2 mg daily for one week.      Then increase to 1.8 mg daily.   She reports excellent compliance with treatment. Patient reports using Saxenda 63m daily.  She feels that condition is Unchanged. She is not having side effects.   Wt Readings from Last 3 Encounters:  03/15/22 198 lb (89.8 kg)  02/20/22 195 lb (88.5 kg)  01/30/22 196 lb (88.9 kg)   Gas bloating indigestion  ----------------------------------------------------------------------------------------- Hypertension, follow-up  BP Readings from Last 3 Encounters:  03/15/22 124/73  02/20/22 129/86  01/30/22 131/84   Wt Readings from Last 3 Encounters:  03/15/22 198 lb (89.8 kg)  02/20/22 195 lb (88.5 kg)  01/30/22 196 lb (88.9 kg)     She was last seen for hypertension 3 months ago.  BP at that visit was 152/100. Management since that visit includes no changes. Metoprolol 25 mg once daily. Advised pt to consistently check at home, if elevated > 130/90 to double metoprolol to 50 mg daily. May need additional ace-I or arb.  She reports excellent compliance with treatment. Patient reports she has had to take  metoprolol '50mg'$  at least twice since last office visit.  She is not having side effects.  She is following a Low Sodium diet. She is exercising. She does not smoke.  Use of agents associated with hypertension: none.   Outside blood pressures are stable 120-140/78-90s. Symptoms: No chest pain No chest pressure  No palpitations No syncope  No dyspnea No orthopnea  No paroxysmal nocturnal dyspnea Yes lower extremity edema   Pertinent labs Lab Results  Component Value Date   CHOL 207 (H) 07/06/2021   HDL 39 (L) 07/06/2021   LDLCALC 144 (H) 07/06/2021   TRIG 132 07/06/2021   CHOLHDL 5.3 (H) 07/06/2021   Lab Results  Component Value Date   NA 139 01/11/2022   K 3.7 01/11/2022   CREATININE 0.75 01/11/2022   GFRNONAA >60 01/11/2022   GLUCOSE 93 01/11/2022   TSH 3.400 11/10/2019     The 10-year ASCVD risk score (Arnett DK, et al., 2019) is: 2.9%  ---------------------------------------------------------------------------------------------------   Medications: Outpatient Medications Prior to Visit  Medication Sig   acetaminophen (TYLENOL) 500 MG tablet Take 2 tablets (1,000 mg total) by mouth every 6 (six) hours as needed for mild pain.   Blood Pressure Monitoring (OMRON 3 SERIES BP MONITOR) DEVI use to check blood pressure   Cholecalciferol (VITAMIN D3) 250 MCG (10000 UT) capsule 1 capsule   ibuprofen (ADVIL) 400 MG tablet 1 tablet with food or milk as needed   Insulin Pen Needle (UNIFINE PENTIPS) 31G X  5 MM MISC Use as directed with Saxenda   lidocaine (XYLOCAINE) 5 % ointment Apply 1 Application topically 4 (four) times daily as needed for mild pain.   Liraglutide -Weight Management (SAXENDA) 18 MG/3ML SOPN For one week. Then increase to 1.2 mg daily for one week. Then increase to 1.8 mg daily.   metoprolol succinate (TOPROL-XL) 25 MG 24 hr tablet TAKE 1 TABLET BY MOUTH DAILY   mometasone (NASONEX) 50 MCG/ACT nasal spray Place 2 sprays into the nose daily.   Multiple  Vitamins-Minerals (VITAMIN D3 COMPLETE PO) Take by mouth.   No facility-administered medications prior to visit.    Review of Systems  Constitutional:  Positive for fatigue. Negative for appetite change.  Respiratory:  Negative for chest tightness and shortness of breath.   Cardiovascular:  Positive for leg swelling. Negative for chest pain and palpitations.  Gastrointestinal:  Positive for nausea. Negative for abdominal pain.        Objective    BP 124/73 (BP Location: Left Arm, Patient Position: Sitting, Cuff Size: Large)   Pulse 99   Temp 98.4 F (36.9 C) (Oral)   Resp 16   Ht '5\' 2"'$  (1.575 m)   Wt 198 lb (89.8 kg)   LMP 03/08/2022 (Exact Date)   BMI 36.21 kg/m  BP Readings from Last 3 Encounters:  03/15/22 124/73  02/20/22 129/86  01/30/22 131/84   Wt Readings from Last 3 Encounters:  03/15/22 198 lb (89.8 kg)  02/20/22 195 lb (88.5 kg)  01/30/22 196 lb (88.9 kg)      Physical Exam  ***  No results found for any visits on 03/15/22.  Assessment & Plan     ***  No follow-ups on file.      {provider attestation***:1}   Mikey Kirschner, PA-C  [facility] [phone] (phone) [phone] (fax)  [place]

## 2022-03-15 ENCOUNTER — Encounter: Payer: Self-pay | Admitting: Physician Assistant

## 2022-03-15 ENCOUNTER — Other Ambulatory Visit: Payer: Self-pay

## 2022-03-15 ENCOUNTER — Ambulatory Visit: Payer: 59 | Admitting: Physician Assistant

## 2022-03-15 VITALS — BP 124/73 | HR 99 | Temp 98.4°F | Resp 16 | Ht 62.0 in | Wt 198.0 lb

## 2022-03-15 DIAGNOSIS — R7303 Prediabetes: Secondary | ICD-10-CM

## 2022-03-15 DIAGNOSIS — I10 Essential (primary) hypertension: Secondary | ICD-10-CM | POA: Diagnosis not present

## 2022-03-15 DIAGNOSIS — Z1231 Encounter for screening mammogram for malignant neoplasm of breast: Secondary | ICD-10-CM | POA: Diagnosis not present

## 2022-03-15 DIAGNOSIS — E663 Overweight: Secondary | ICD-10-CM | POA: Diagnosis not present

## 2022-03-15 DIAGNOSIS — E782 Mixed hyperlipidemia: Secondary | ICD-10-CM

## 2022-03-15 DIAGNOSIS — Z6837 Body mass index (BMI) 37.0-37.9, adult: Secondary | ICD-10-CM

## 2022-03-15 MED ORDER — SAXENDA 18 MG/3ML ~~LOC~~ SOPN
3.0000 mg | PEN_INJECTOR | Freq: Every day | SUBCUTANEOUS | 1 refills | Status: DC
  Filled 2022-03-15 (×2): qty 15, 30d supply, fill #0

## 2022-03-15 MED ORDER — UNIFINE PENTIPS 31G X 5 MM MISC
0 refills | Status: DC
  Filled 2022-03-15: qty 100, fill #0
  Filled 2022-03-15: qty 100, 90d supply, fill #0

## 2022-03-15 NOTE — Assessment & Plan Note (Addendum)
Starting weight 202 lbs 11/2021 today 198 lbs Now on saxenda 3 mg daily for the last 5 weeks. Long discussion about diet, glucose, insulin. Advised she change the frequency and portion size of her meals. She was unable to exercise 2/2 surgery but recommended adding back activity

## 2022-03-15 NOTE — Assessment & Plan Note (Signed)
In range.  Takes metop 25 mg and occasionally 50 mg daily. Ordered cmp

## 2022-03-18 ENCOUNTER — Encounter: Payer: Self-pay | Admitting: Physician Assistant

## 2022-03-18 NOTE — Assessment & Plan Note (Signed)
Pt requests a fasting insulin blood draw, will comply but advised it will not change our plan

## 2022-03-19 DIAGNOSIS — I10 Essential (primary) hypertension: Secondary | ICD-10-CM | POA: Diagnosis not present

## 2022-03-19 DIAGNOSIS — E782 Mixed hyperlipidemia: Secondary | ICD-10-CM | POA: Diagnosis not present

## 2022-03-19 DIAGNOSIS — E663 Overweight: Secondary | ICD-10-CM | POA: Diagnosis not present

## 2022-03-19 DIAGNOSIS — R7303 Prediabetes: Secondary | ICD-10-CM | POA: Diagnosis not present

## 2022-03-20 LAB — COMPREHENSIVE METABOLIC PANEL
ALT: 20 IU/L (ref 0–32)
AST: 15 IU/L (ref 0–40)
Albumin/Globulin Ratio: 1.8 (ref 1.2–2.2)
Albumin: 4.2 g/dL (ref 3.8–4.9)
Alkaline Phosphatase: 91 IU/L (ref 44–121)
BUN/Creatinine Ratio: 14 (ref 9–23)
BUN: 13 mg/dL (ref 6–24)
Bilirubin Total: 0.5 mg/dL (ref 0.0–1.2)
CO2: 20 mmol/L (ref 20–29)
Calcium: 9 mg/dL (ref 8.7–10.2)
Chloride: 105 mmol/L (ref 96–106)
Creatinine, Ser: 0.9 mg/dL (ref 0.57–1.00)
Globulin, Total: 2.4 g/dL (ref 1.5–4.5)
Glucose: 98 mg/dL (ref 70–99)
Potassium: 4.3 mmol/L (ref 3.5–5.2)
Sodium: 141 mmol/L (ref 134–144)
Total Protein: 6.6 g/dL (ref 6.0–8.5)
eGFR: 77 mL/min/{1.73_m2} (ref >59)

## 2022-03-20 LAB — LIPID PANEL
Chol/HDL Ratio: 4.9 ratio — ABNORMAL HIGH (ref 0.0–4.4)
Cholesterol, Total: 229 mg/dL — ABNORMAL HIGH (ref 100–199)
HDL: 47 mg/dL (ref >39)
LDL Chol Calc (NIH): 163 mg/dL — ABNORMAL HIGH (ref 0–99)
Triglycerides: 105 mg/dL (ref 0–149)
VLDL Cholesterol Cal: 19 mg/dL (ref 5–40)

## 2022-03-20 LAB — HEMOGLOBIN A1C
Est. average glucose Bld gHb Est-mCnc: 114 mg/dL
Hgb A1c MFr Bld: 5.6 % (ref 4.8–5.6)

## 2022-03-26 LAB — INSULIN, FREE AND TOTAL
Free Insulin: 29 uU/mL — ABNORMAL HIGH
Total Insulin: 29 uU/mL

## 2022-03-27 ENCOUNTER — Encounter: Payer: Self-pay | Admitting: Physician Assistant

## 2022-03-27 ENCOUNTER — Other Ambulatory Visit: Payer: Self-pay

## 2022-03-28 ENCOUNTER — Other Ambulatory Visit: Payer: Self-pay

## 2022-03-28 ENCOUNTER — Other Ambulatory Visit: Payer: Self-pay | Admitting: Family Medicine

## 2022-03-28 DIAGNOSIS — E782 Mixed hyperlipidemia: Secondary | ICD-10-CM

## 2022-03-28 DIAGNOSIS — E663 Overweight: Secondary | ICD-10-CM

## 2022-03-28 DIAGNOSIS — I10 Essential (primary) hypertension: Secondary | ICD-10-CM

## 2022-03-28 DIAGNOSIS — R7303 Prediabetes: Secondary | ICD-10-CM

## 2022-03-28 MED ORDER — SEMAGLUTIDE-WEIGHT MANAGEMENT 0.25 MG/0.5ML ~~LOC~~ SOAJ
0.2500 mg | SUBCUTANEOUS | 0 refills | Status: DC
  Filled 2022-03-28: qty 2, 28d supply, fill #0

## 2022-04-22 ENCOUNTER — Other Ambulatory Visit: Payer: Self-pay | Admitting: Physician Assistant

## 2022-04-22 ENCOUNTER — Other Ambulatory Visit: Payer: Self-pay

## 2022-04-22 ENCOUNTER — Encounter: Payer: Self-pay | Admitting: Physician Assistant

## 2022-04-22 DIAGNOSIS — E669 Obesity, unspecified: Secondary | ICD-10-CM

## 2022-04-22 MED ORDER — SEMAGLUTIDE-WEIGHT MANAGEMENT 1 MG/0.5ML ~~LOC~~ SOAJ
1.0000 mg | SUBCUTANEOUS | 0 refills | Status: AC
  Filled 2022-04-22: qty 2, fill #0

## 2022-04-22 MED ORDER — SEMAGLUTIDE-WEIGHT MANAGEMENT 0.5 MG/0.5ML ~~LOC~~ SOAJ
0.5000 mg | SUBCUTANEOUS | 0 refills | Status: AC
  Filled 2022-04-22: qty 2, 28d supply, fill #0

## 2022-04-25 ENCOUNTER — Other Ambulatory Visit: Payer: Self-pay

## 2022-05-16 ENCOUNTER — Other Ambulatory Visit: Payer: Self-pay

## 2022-05-22 DIAGNOSIS — G4733 Obstructive sleep apnea (adult) (pediatric): Secondary | ICD-10-CM | POA: Diagnosis not present

## 2022-05-27 ENCOUNTER — Encounter (INDEPENDENT_AMBULATORY_CARE_PROVIDER_SITE_OTHER): Payer: Self-pay

## 2022-05-27 ENCOUNTER — Ambulatory Visit
Admission: RE | Admit: 2022-05-27 | Discharge: 2022-05-27 | Disposition: A | Discharge status: Home / Self Care | Payer: 59 | Source: Ambulatory Visit | Attending: Physician Assistant | Admitting: Physician Assistant

## 2022-05-27 DIAGNOSIS — Z1231 Encounter for screening mammogram for malignant neoplasm of breast: Secondary | ICD-10-CM | POA: Insufficient documentation

## 2022-06-18 ENCOUNTER — Ambulatory Visit (INDEPENDENT_AMBULATORY_CARE_PROVIDER_SITE_OTHER): Payer: 59 | Admitting: Physician Assistant

## 2022-06-18 ENCOUNTER — Encounter: Payer: Self-pay | Admitting: Physician Assistant

## 2022-06-18 VITALS — BP 112/74 | HR 85 | Ht 63.0 in | Wt 200.7 lb

## 2022-06-18 DIAGNOSIS — E669 Obesity, unspecified: Secondary | ICD-10-CM

## 2022-06-18 NOTE — Progress Notes (Signed)
      I,Sha'taria Tyson,acting as a Education administrator for [employer], PA-C.,have documented all relevant documentation on the behalf of Mikey Kirschner, PA-C,as directed by  Mikey Kirschner, PA-C while in the presence of Mikey Kirschner, PA-C.    Established patient visit   Patient: Audrey Koch   DOB: 08/25/1970   51 y.o. Female  MRN: [id number] Visit Date: 06/18/2022  Today's healthcare provider: Mikey Kirschner, PA-C   No chief complaint on file.  Subjective    HPI  -Received flu through [facility]  Follow up for weight management The patient was last seen for this 3 months ago. Changes made at last visit include on saxenda 3 mg daily for the last 5 weeks. Long discussion about diet, glucose, insulin. Advised she change the frequency and portion size of her meals. She was unable to exercise 2/2 surgery but recommended adding back activity.  She reports poor compliance with treatment. She feels that condition is Unchanged. She is not having side effects  -----------------------------------------------------------------------------------------   Medications: Outpatient Medications Prior to Visit  Medication Sig  . acetaminophen (TYLENOL) 500 MG tablet Take 2 tablets (1,000 mg total) by mouth every 6 (six) hours as needed for mild pain.  . Blood Pressure Monitoring (OMRON 3 SERIES BP MONITOR) DEVI use to check blood pressure  . Cholecalciferol (VITAMIN D3) 250 MCG (10000 UT) capsule 1 capsule  . ibuprofen (ADVIL) 400 MG tablet 1 tablet with food or milk as needed  . Insulin Pen Needle (UNIFINE PENTIPS) 31G X 5 MM MISC Use as directed with Saxenda  . lidocaine (XYLOCAINE) 5 % ointment Apply 1 Application topically 4 (four) times daily as needed for mild pain.  . metoprolol succinate (TOPROL-XL) 25 MG 24 hr tablet TAKE 1 TABLET BY MOUTH DAILY  . mometasone (NASONEX) 50 MCG/ACT nasal spray Place 2 sprays into the nose daily.  . Multiple Vitamins-Minerals (VITAMIN D3 COMPLETE PO) Take  by mouth.  . Semaglutide-Weight Management 0.25 MG/0.5ML SOAJ Inject 0.25 mg into the skin once a week.   No facility-administered medications prior to visit.    Review of Systems  {Labs  Heme  Chem  Endocrine  Serology  Results Review (optional):23779}   Objective    LMP 05/06/2022  {Show previous vital signs (optional):23777}  Physical Exam  ***  No results found for any visits on 06/18/22.  Assessment & Plan     ***  No follow-ups on file.      {provider attestation***:1}   Mikey Kirschner, PA-C  [facility] [phone] (phone) [phone] (fax)  [place]

## 2022-06-19 ENCOUNTER — Encounter: Payer: Self-pay | Admitting: Physician Assistant

## 2022-06-19 NOTE — Assessment & Plan Note (Signed)
Advised at this point there are no available medications for her for weight loss.  H/o of phentermine, caused HTN.  D/t underlying anxiety and h/o depression, contrave not ideal. Pt had significant nausea and vomiting on saxenda. Wegovy not available. She has seen nutritionists and been on different calorie restricted diets.   Advised at this point she needs to focus on a 'natural' approach of a balanced diet high in protein, good fats, fiber, low in carbs and sugars; and regular intention activity/exercise. Suggested gym partner or personal trainer.  Advised mounjaro is being anticipated for FDA approval for weight loss; when this happens this is an avenue we can try.

## 2022-07-08 ENCOUNTER — Encounter: Payer: Self-pay | Admitting: Family Medicine

## 2022-07-08 ENCOUNTER — Ambulatory Visit (INDEPENDENT_AMBULATORY_CARE_PROVIDER_SITE_OTHER): Payer: 59 | Admitting: Family Medicine

## 2022-07-08 VITALS — BP 129/82 | HR 88 | Temp 98.2°F | Resp 16 | Ht 62.0 in | Wt 200.4 lb

## 2022-07-08 DIAGNOSIS — E669 Obesity, unspecified: Secondary | ICD-10-CM

## 2022-07-08 DIAGNOSIS — Z0000 Encounter for general adult medical examination without abnormal findings: Secondary | ICD-10-CM

## 2022-07-08 NOTE — Assessment & Plan Note (Signed)
Discussed importance of healthy weight management Discussed diet and exercise  

## 2022-07-08 NOTE — Progress Notes (Signed)
I,Sulibeya S Dimas,acting as a Education administrator for Lavon Paganini, MD.,have documented all relevant documentation on the behalf of Lavon Paganini, MD,as directed by  Lavon Paganini, MD while in the presence of Lavon Paganini, MD.    Complete physical exam   Patient: Audrey Koch   DOB: 1970-11-28   51 y.o. Female  MRN: [id number] Visit Date: 07/08/2022  Today's healthcare provider: Lavon Paganini, MD   Chief Complaint  Patient presents with   Annual Exam   Subjective    Audrey Koch is a 51 y.o. female who presents today for a complete physical exam.  She reports consuming a general diet. Home exercise routine includes weight training. She generally feels well. She reports sleeping well. She does not have additional problems to discuss today.  HPI    Past Medical History:  Diagnosis Date   Anxiety    History of kidney stones    Hyperlipidemia    Hypertension    Migraine    Pre-diabetes    Sleep apnea    Past Surgical History:  Procedure Laterality Date   COLONOSCOPY WITH PROPOFOL N/A 06/04/2021   Procedure: COLONOSCOPY WITH PROPOFOL;  Surgeon: Lin Landsman, MD;  Location: [facility];  Service: Gastroenterology;  Laterality: N/A;   EVALUATION UNDER ANESTHESIA WITH HEMORRHOIDECTOMY N/A 01/16/2022   Procedure: EXAM UNDER ANESTHESIA WITH HEMORRHOIDECTOMY of 2 + columns;  Surgeon: Olean Ree, MD;  Location: [facility];  Service: General;  Laterality: N/A;   FINGER FRACTURE SURGERY Right 2016   ring finger   HEMORROIDECTOMY  10/08/2013   Social History   Socioeconomic History   Marital status: Married    Spouse name: Audrey Koch   Number of children: 2   Years of education: Not on file   Highest education level: Master's degree (e.g., MA, MS, MEng, MEd, MSW, MBA)  Occupational History   Not on file  Tobacco Use   Smoking status: Never   Smokeless tobacco: Never  Vaping Use   Vaping Use: Never used  Substance and Sexual Activity   Alcohol use: No    Drug use: No   Sexual activity: Not on file  Other Topics Concern   Not on file  Social History Narrative   Live with husband and daughter Audrey Koch   Right handed   Caffeine: 12oz of caffeine a day   Social Determinants of Health   Financial Resource Strain: Not on file  Food Insecurity: Not on file  Transportation Needs: Not on file  Physical Activity: Not on file  Stress: Not on file  Social Connections: Not on file  Intimate Partner Violence: Not on file   Family Status  Relation Name Status   Mother  Alive   Father  Deceased   MGM  Alive   MGF  Alive   [place]  Alive   PGF  Alive   Brother 1 Alive   Brother 2 Alive   Family History  Problem Relation Age of Onset   Breast cancer Mother    Heart attack Mother    Hyperlipidemia Mother    Dementia Maternal Grandmother    Diabetes Maternal Grandfather    Dementia Paternal Grandmother    Diabetes Paternal Grandfather    Esophageal cancer Paternal Grandfather    Diabetes Brother    Allergies  Allergen Reactions   Phentermine Hcl Other (See Comments)    Elevated B/P   Spironolactone Rash   Sulfa Antibiotics Rash    Patient Care Team: Mikey Kirschner, PA-C as PCP -  General (Physician Assistant) Margarita Rana, MD as Referring Physician (Family Medicine) Christene Lye, MD (General Surgery)   Medications: Outpatient Medications Prior to Visit  Medication Sig   acetaminophen (TYLENOL) 500 MG tablet Take 2 tablets (1,000 mg total) by mouth every 6 (six) hours as needed for mild pain.   Blood Pressure Monitoring (OMRON 3 SERIES BP MONITOR) DEVI use to check blood pressure   Cholecalciferol (VITAMIN D3) 250 MCG (10000 UT) capsule 1 capsule   ibuprofen (ADVIL) 400 MG tablet 1 tablet with food or milk as needed   lidocaine (XYLOCAINE) 5 % ointment Apply 1 Application topically 4 (four) times daily as needed for mild pain.   metoprolol succinate (TOPROL-XL) 25 MG 24 hr tablet TAKE 1 TABLET BY MOUTH DAILY    mometasone (NASONEX) 50 MCG/ACT nasal spray Place 2 sprays into the nose daily.   Multiple Vitamins-Minerals (VITAMIN D3 COMPLETE PO) Take by mouth.   No facility-administered medications prior to visit.    Review of Systems  Musculoskeletal:  Positive for back pain.  All other systems reviewed and are negative.   Last CBC Lab Results  Component Value Date   WBC 7.4 01/11/2022   HGB 14.0 01/11/2022   HCT 43.7 01/11/2022   MCV 87.4 01/11/2022   MCH 28.0 01/11/2022   RDW 14.2 01/11/2022   PLT 333 08/67/6195   Last metabolic panel Lab Results  Component Value Date   GLUCOSE 98 03/19/2022   NA 141 03/19/2022   K 4.3 03/19/2022   CL 105 03/19/2022   CO2 20 03/19/2022   BUN 13 03/19/2022   CREATININE 0.90 03/19/2022   EGFR 77 03/19/2022   CALCIUM 9.0 03/19/2022   PROT 6.6 03/19/2022   ALBUMIN 4.2 03/19/2022   LABGLOB 2.4 03/19/2022   AGRATIO 1.8 03/19/2022   BILITOT 0.5 03/19/2022   ALKPHOS 91 03/19/2022   AST 15 03/19/2022   ALT 20 03/19/2022   ANIONGAP 6 01/11/2022   Last lipids Lab Results  Component Value Date   CHOL 229 (H) 03/19/2022   HDL 47 03/19/2022   LDLCALC 163 (H) 03/19/2022   TRIG 105 03/19/2022   CHOLHDL 4.9 (H) 03/19/2022   Last hemoglobin A1c Lab Results  Component Value Date   HGBA1C 5.6 03/19/2022   Last thyroid functions Lab Results  Component Value Date   TSH 3.400 11/10/2019   T4TOTAL 7.9 07/19/2016   Last vitamin D Lab Results  Component Value Date   VD25OH 34.5 07/06/2021   Last vitamin B12 and Folate Lab Results  Component Value Date   VITAMINB12 384 07/19/2016      Objective    BP 129/82 (BP Location: Left Arm, Patient Position: Sitting, Cuff Size: Large)   Pulse 88   Temp 98.2 F (36.8 C) (Oral)   Resp 16   Ht _0  (1.575 m)   Wt 200 lb 6.4 oz (90.9 kg)   LMP 05/30/2022 (Approximate)   BMI 36.65 kg/m  BP Readings from Last 3 Encounters:  07/08/22 129/82  06/18/22 112/74  03/15/22 124/73   Wt Readings  from Last 3 Encounters:  07/08/22 200 lb 6.4 oz (90.9 kg)  06/18/22 200 lb 11.2 oz (91 kg)  03/15/22 198 lb (89.8 kg)      Physical Exam Vitals reviewed.  Constitutional:      General: She is not in acute distress.    Appearance: Normal appearance. She is well-developed. She is not diaphoretic.  HENT:     Head: Normocephalic and atraumatic.  Right Ear: Tympanic membrane, ear canal and external ear normal.     Left Ear: Tympanic membrane, ear canal and external ear normal.     Nose: Nose normal.     Mouth/Throat:     Mouth: Mucous membranes are moist.     Pharynx: Oropharynx is clear. No oropharyngeal exudate.  Eyes:     General: No scleral icterus.    Conjunctiva/sclera: Conjunctivae normal.     Pupils: Pupils are equal, round, and reactive to light.  Neck:     Thyroid: No thyromegaly.  Cardiovascular:     Rate and Rhythm: Normal rate and regular rhythm.     Pulses: Normal pulses.     Heart sounds: Normal heart sounds. No murmur heard. Pulmonary:     Effort: Pulmonary effort is normal. No respiratory distress.     Breath sounds: Normal breath sounds. No wheezing or rales.  Abdominal:     General: There is no distension.     Palpations: Abdomen is soft.     Tenderness: There is no abdominal tenderness.  Musculoskeletal:        General: No deformity.     Cervical back: Neck supple.     Right lower leg: No edema.     Left lower leg: No edema.  Lymphadenopathy:     Cervical: No cervical adenopathy.  Skin:    General: Skin is warm and dry.     Findings: No rash.  Neurological:     Mental Status: She is alert and oriented to person, place, and time. Mental status is at baseline.     Sensory: No sensory deficit.     Motor: No weakness.     Gait: Gait normal.  Psychiatric:        Mood and Affect: Mood normal.        Behavior: Behavior normal.        Thought Content: Thought content normal.       Last depression screening scores    07/08/2022    3:25 PM  06/18/2022    4:24 PM 12/14/2021    3:09 PM  PHQ 2/9 Scores  PHQ - 2 Score 0 0 1  PHQ- 9 Score _0 Last fall risk screening    06/18/2022    4:23 PM  [place] in the past year? 0  Number falls in past yr: 0  Injury with Fall? 0  Risk for fall due to : No Fall Risks  Follow up Falls evaluation completed   Last Audit-C alcohol use screening    06/18/2022    4:23 PM  Alcohol Use Disorder Test (AUDIT)  1. How often do you have a drink containing alcohol? 0  2. How many drinks containing alcohol do you have on a typical day when you are drinking? 0  3. How often do you have six or more drinks on one occasion? 0  AUDIT-C Score 0   A score of 3 or more in women, and 4 or more in men indicates increased risk for alcohol abuse, EXCEPT if all of the points are from question 1   No results found for any visits on 07/08/22.  Assessment & Plan    Routine Health Maintenance and Physical Exam  Exercise Activities and Dietary recommendations  Goals   None     Immunization History  Administered Date(s) Administered   DTaP 02/12/1974, 03/11/1988, 10/31/1998   Hepatitis B 05/16/1992, 06/30/1992, 11/17/1992   IPV 05/12/2006  Influenza Whole 05/06/2017   Influenza,inj,Quad PF,6+ Mos 04/28/2022   Influenza-Unspecified 04/08/2018, 04/28/2019, 03/29/2021   PFIZER(Purple Top)SARS-COV-2 Vaccination 07/26/2019, 08/16/2019, 08/24/2019   Td 03/11/1988   Tdap 07/05/2021   Typhoid Live 05/12/2006    Health Maintenance  Topic Date Due   Zoster Vaccines- Shingrix (1 of 2) Never done   COVID-19 Vaccine (4 - 2023-24 season) 03/29/2022   MAMMOGRAM  05/28/2023   PAP SMEAR-Modifier  11/07/2024   COLONOSCOPY (Pts 45-76yr Insurance coverage will need to be confirmed)  06/04/2028   DTaP/Tdap/Td (5 - Td or Tdap) 07/06/2031   INFLUENZA VACCINE  Completed   Hepatitis C Screening  Completed   HIV Screening  Completed   HPV VACCINES  Aged Out    Discussed health benefits of  physical activity, and encouraged her to engage in regular exercise appropriate for her age and condition.  Problem List Items Addressed This Visit       Other   Obesity (BMI 30-39.9)    Discussed importance of healthy weight management Discussed diet and exercise       Other Visit Diagnoses     Encounter for annual physical exam    -  Primary        Return in about 1 year (around 07/09/2023) for CPE, With PCP.     I, ALavon Paganini MD, have reviewed all documentation for this visit. The documentation on 07/08/22 for the exam, diagnosis, procedures, and orders are all accurate and complete.   Aubryn Spinola, ADionne Bucy MD, MPH [facility]

## 2022-07-18 ENCOUNTER — Other Ambulatory Visit: Payer: Self-pay | Admitting: Physician Assistant

## 2022-07-18 ENCOUNTER — Other Ambulatory Visit: Payer: Self-pay

## 2022-07-18 ENCOUNTER — Encounter: Payer: Self-pay | Admitting: Physician Assistant

## 2022-07-18 DIAGNOSIS — E669 Obesity, unspecified: Secondary | ICD-10-CM

## 2022-07-18 MED ORDER — TIRZEPATIDE-WEIGHT MANAGEMENT 2.5 MG/0.5ML ~~LOC~~ SOAJ
2.5000 mg | SUBCUTANEOUS | 0 refills | Status: DC
  Filled 2022-07-18 – 2022-07-30 (×6): qty 2, 28d supply, fill #0

## 2022-07-19 ENCOUNTER — Other Ambulatory Visit: Payer: Self-pay

## 2022-07-22 ENCOUNTER — Other Ambulatory Visit: Payer: Self-pay | Admitting: Physician Assistant

## 2022-07-22 ENCOUNTER — Other Ambulatory Visit: Payer: Self-pay

## 2022-07-22 DIAGNOSIS — I10 Essential (primary) hypertension: Secondary | ICD-10-CM

## 2022-07-23 ENCOUNTER — Other Ambulatory Visit: Payer: Self-pay

## 2022-07-23 MED ORDER — METOPROLOL SUCCINATE ER 25 MG PO TB24
ORAL_TABLET | Freq: Every day | ORAL | 1 refills | Status: DC
  Filled 2022-07-23: qty 90, 90d supply, fill #0
  Filled 2022-11-20: qty 90, 90d supply, fill #1

## 2022-07-24 ENCOUNTER — Other Ambulatory Visit: Payer: Self-pay

## 2022-07-25 ENCOUNTER — Other Ambulatory Visit: Payer: Self-pay

## 2022-07-26 ENCOUNTER — Other Ambulatory Visit: Payer: Self-pay

## 2022-07-28 ENCOUNTER — Other Ambulatory Visit: Payer: Self-pay

## 2022-07-30 ENCOUNTER — Other Ambulatory Visit: Payer: Self-pay

## 2022-08-20 ENCOUNTER — Other Ambulatory Visit: Payer: Self-pay | Admitting: Physician Assistant

## 2022-08-20 ENCOUNTER — Other Ambulatory Visit: Payer: Self-pay

## 2022-08-20 DIAGNOSIS — E669 Obesity, unspecified: Secondary | ICD-10-CM

## 2022-08-20 MED ORDER — ZEPBOUND 2.5 MG/0.5ML ~~LOC~~ SOAJ
2.5000 mg | SUBCUTANEOUS | 0 refills | Status: DC
  Filled 2022-08-20 – 2022-08-23 (×2): qty 2, 28d supply, fill #0

## 2022-08-21 ENCOUNTER — Other Ambulatory Visit: Payer: Self-pay

## 2022-08-23 ENCOUNTER — Other Ambulatory Visit: Payer: Self-pay

## 2022-08-23 ENCOUNTER — Encounter: Payer: Self-pay | Admitting: Physician Assistant

## 2022-08-26 ENCOUNTER — Other Ambulatory Visit: Payer: Self-pay | Admitting: Physician Assistant

## 2022-08-26 ENCOUNTER — Other Ambulatory Visit: Payer: Self-pay

## 2022-08-26 ENCOUNTER — Encounter: Payer: Self-pay | Admitting: Physician Assistant

## 2022-08-26 DIAGNOSIS — E669 Obesity, unspecified: Secondary | ICD-10-CM

## 2022-08-26 MED ORDER — ZEPBOUND 5 MG/0.5ML ~~LOC~~ SOAJ
5.0000 mg | SUBCUTANEOUS | 1 refills | Status: DC
  Filled 2022-08-26 – 2022-08-27 (×4): qty 2, 28d supply, fill #0
  Filled 2022-09-20 – 2022-09-26 (×5): qty 2, 28d supply, fill #1

## 2022-08-27 ENCOUNTER — Other Ambulatory Visit: Payer: Self-pay

## 2022-09-19 NOTE — Patient Instructions (Addendum)

## 2022-09-19 NOTE — Progress Notes (Signed)
PATIENT: Audrey Koch DOB: [birth year]  REASON FOR VISIT: follow up HISTORY FROM: patient  Chief Complaint  Patient presents with   Follow-up    Pt in room 1, here for cpap follow up. Pt reports doing well on cpap.     HISTORY OF PRESENT ILLNESS:  09/24/22 ALL:  Audrey Koch is a 52 y.o. female here today for follow up for OSA on CPAP.  She continues to do well on therapy. She does note benefit of using therapy. She is using therapy every night for about 7 hours. She denies concerns with machine or supplies. She continues to work on [employer]. She continues Zepbound injections. She is followed closely by PCP.     HISTORY: (copied from Dr Dohmeier's previous note)  Audrey Koch is a 52 y.o. Caucasian female patient and seen on 09-24-2021; following after HST and CPAP initiation.  I had the pleasure of meeting today again with a meat loaded she underwent a home sleep test following her initial consultation on 05-08-2021.  The patient had endorsed the Epworth sleepiness score at 11 points the calculated AHI or apnea hypopnea index was 14.1/h but during REM sleep more than doubled to 29.7/h non-REM AHI was 9.9/h.  In supine sleep there was also a critical accentuation to 15.7/h.  Snoring was unusually loud the mean volume was measured at 52 dB.  And over 15.6% of the total sleep time there was even a volume over 60 dB. There was no significant hypoxia heart rate varied between 53 and 105 bpm and due to the REM dependent apnea and the elevated BMI we decided to recommend a CPAP titration.  The patient was set up for an autotitrator between 6 and 16 cmH2O pressure window 3 cm expiratory pressure relief with heated humidification for tubing and machine or mask to be fitted.  I have not the pleasure of seeing the compliance report and this patient has been 100% compliant she has used the machine every day of the last 30 days and each of those days over 4 hours with an average of 7 hours 50  minutes minimum pressure is as ordered 6 cmH2O maximum pressure is 16 cmH2O her residual AHI is 0.2/h this is an excellent resolution of apnea the 95th percentile pressure was 9.3 cmH2O so well within the current settings.  The settings have not to change neither does the mask which does not allow for a lot of air leakage.  But what has been remarkable is a reduction in the Epworth Sleepiness Scale to 4 points when the pre-CPAP score was 11 points.  So there is a significant reduction in sleepiness and and fatigue.  She is going to the bathroom 1-2 times now, not 3-4 times.   REVIEW OF SYSTEMS: Out of a complete 14 system review of symptoms, the patient complains only of the following symptoms, none and all other reviewed systems are negative.  ESS: 3/24  ALLERGIES: Allergies  Allergen Reactions   Phentermine Hcl Other (See Comments)    Elevated B/P   Spironolactone Rash   Sulfa Antibiotics Rash    HOME MEDICATIONS: Outpatient Medications Prior to Visit  Medication Sig Dispense Refill   acetaminophen (TYLENOL) 500 MG tablet Take 2 tablets (1,000 mg total) by mouth every 6 (six) hours as needed for mild pain.     Blood Pressure Monitoring (OMRON 3 SERIES BP MONITOR) DEVI use to check blood pressure 1 each 0   Cholecalciferol (VITAMIN D3) 250  MCG (10000 UT) capsule 1 capsule     ibuprofen (ADVIL) 400 MG tablet 1 tablet with food or milk as needed     lidocaine (XYLOCAINE) 5 % ointment Apply 1 Application topically 4 (four) times daily as needed for mild pain. 35.44 g 0   metoprolol succinate (TOPROL-XL) 25 MG 24 hr tablet TAKE 1 TABLET BY MOUTH DAILY 90 tablet 1   mometasone (NASONEX) 50 MCG/ACT nasal spray Place 2 sprays into the nose daily. 1 each 12   Multiple Vitamins-Minerals (VITAMIN D3 COMPLETE PO) Take by mouth.     tirzepatide (ZEPBOUND) 5 MG/0.5ML Pen Inject 5 mg into the skin once a week. 2 mL 1   No facility-administered medications prior to visit.    PAST MEDICAL  HISTORY: Past Medical History:  Diagnosis Date   Anxiety    History of kidney stones    Hyperlipidemia    Hypertension    Migraine    Pre-diabetes    Sleep apnea     PAST SURGICAL HISTORY: Past Surgical History:  Procedure Laterality Date   COLONOSCOPY WITH PROPOFOL N/A 06/04/2021   Procedure: COLONOSCOPY WITH PROPOFOL;  Surgeon: Lin Landsman, MD;  Location: [facility];  Service: Gastroenterology;  Laterality: N/A;   EVALUATION UNDER ANESTHESIA WITH HEMORRHOIDECTOMY N/A 01/16/2022   Procedure: EXAM UNDER ANESTHESIA WITH HEMORRHOIDECTOMY of 2 + columns;  Surgeon: Olean Ree, MD;  Location: [facility];  Service: General;  Laterality: N/A;   FINGER FRACTURE SURGERY Right 2016   ring finger   HEMORROIDECTOMY  10/08/2013    FAMILY HISTORY: Family History  Problem Relation Age of Onset   Breast cancer Mother    Heart attack Mother    Hyperlipidemia Mother    Dementia Maternal Grandmother    Diabetes Maternal Grandfather    Dementia Paternal Grandmother    Diabetes Paternal Grandfather    Esophageal cancer Paternal Grandfather    Diabetes Brother     SOCIAL HISTORY: Social History   Socioeconomic History   Marital status: Married    Spouse name: Audrey Koch   Number of children: 2   Years of education: Not on file   Highest education level: Master's degree (e.g., MA, MS, MEng, MEd, MSW, MBA)  Occupational History   Not on file  Tobacco Use   Smoking status: Never   Smokeless tobacco: Never  Vaping Use   Vaping Use: Never used  Substance and Sexual Activity   Alcohol use: No   Drug use: No   Sexual activity: Not on file  Other Topics Concern   Not on file  Social History Narrative   Live with husband and daughter Lauren   Right handed   Caffeine: 12oz of caffeine a day   Social Determinants of Health   Financial Resource Strain: Not on file  Food Insecurity: Not on file  Transportation Needs: Not on file  Physical Activity: Not on file  Stress: Not  on file  Social Connections: Not on file  Intimate Partner Violence: Not on file     PHYSICAL EXAM  Vitals:   09/24/22 1432  BP: 118/84  Pulse: 71  Weight: 197 lb (89.4 kg)  Height: '5\' 2"'$  (1.575 m)   Body mass index is 36.03 kg/m.  Generalized: Well developed, in no acute distress  Cardiology: normal rate and rhythm, no murmur noted Respiratory: clear to auscultation bilaterally  Neurological examination  Mentation: Alert oriented to time, place, history taking. Follows all commands speech and language fluent Cranial nerve II-XII: Pupils were  equal round reactive to light. Extraocular movements were full, visual field were full  Motor: The motor testing reveals 5 over 5 strength of all 4 extremities. Good symmetric motor tone is noted throughout.  Gait and station: Gait is normal.    DIAGNOSTIC DATA (LABS, IMAGING, TESTING) - I reviewed patient records, labs, notes, testing and imaging myself where available.      No data to display           Lab Results  Component Value Date   WBC 7.4 01/11/2022   HGB 14.0 01/11/2022   HCT 43.7 01/11/2022   MCV 87.4 01/11/2022   PLT 333 01/11/2022      Component Value Date/Time   NA 141 03/19/2022 0810   NA 142 12/10/2011 0922   K 4.3 03/19/2022 0810   K 3.7 12/10/2011 0922   CL 105 03/19/2022 0810   CL 108 (H) 12/10/2011 0922   CO2 20 03/19/2022 0810   CO2 26 12/10/2011 0922   GLUCOSE 98 03/19/2022 0810   GLUCOSE 93 01/11/2022 0928   GLUCOSE 80 12/10/2011 0922   BUN 13 03/19/2022 0810   BUN 15 12/10/2011 0922   CREATININE 0.90 03/19/2022 0810   CREATININE 0.75 12/10/2011 0922   CALCIUM 9.0 03/19/2022 0810   CALCIUM 8.2 (L) 12/10/2011 0922   PROT 6.6 03/19/2022 0810   PROT 7.0 12/10/2011 0922   ALBUMIN 4.2 03/19/2022 0810   ALBUMIN 3.8 12/10/2011 0922   AST 15 03/19/2022 0810   AST 14 (L) 12/10/2011 0922   ALT 20 03/19/2022 0810   ALT 20 12/10/2011 0922   ALKPHOS 91 03/19/2022 0810   ALKPHOS 67 12/10/2011  0922   BILITOT 0.5 03/19/2022 0810   BILITOT 0.6 12/10/2011 0922   GFRNONAA >60 01/11/2022 0928   GFRNONAA >60 12/10/2011 0922   GFRAA 82 11/10/2019 0807   GFRAA >60 12/10/2011 0922   Lab Results  Component Value Date   CHOL 229 (H) 03/19/2022   HDL 47 03/19/2022   LDLCALC 163 (H) 03/19/2022   TRIG 105 03/19/2022   CHOLHDL 4.9 (H) 03/19/2022   Lab Results  Component Value Date   HGBA1C 5.6 03/19/2022   Lab Results  Component Value Date   VITAMINB12 384 07/19/2016   Lab Results  Component Value Date   TSH 3.400 11/10/2019     ASSESSMENT AND PLAN 52 y.o. year old female  has a past medical history of Anxiety, History of kidney stones, Hyperlipidemia, Hypertension, Migraine, Pre-diabetes, and Sleep apnea. here with     ICD-10-CM   1. OSA on CPAP  G47.33 For home use only DME continuous positive airway pressure (CPAP)       Chiana T Ely is doing well on CPAP therapy. Compliance report reveals excellent compliance. She was encouraged to continue using CPAP nightly and for greater than 4 hours each night. We will update supply orders as indicated. Risks of untreated sleep apnea review and education materials provided. Healthy lifestyle habits encouraged. She will follow up in 1 year, sooner if needed. She verbalizes understanding and agreement with this plan.    Orders Placed This Encounter  Procedures   For home use only DME continuous positive airway pressure (CPAP)    Supplies    Order Specific Question:   Length of Need    Answer:   Lifetime    Order Specific Question:   Patient has OSA or probable OSA    Answer:   Yes    Order Specific Question:  Is the patient currently using CPAP in the home    Answer:   Yes    Order Specific Question:   Settings    Answer:   Other see comments    Order Specific Question:   CPAP supplies needed    Answer:   Mask, headgear, cushions, filters, heated tubing and water chamber     No orders of the defined types were placed in  this encounter.     Micheale Scanlin, FNP-C 09/24/2022, 3:16 PM [facility] [address] [place], [place] [address] [phone]

## 2022-09-20 ENCOUNTER — Other Ambulatory Visit: Payer: Self-pay

## 2022-09-24 ENCOUNTER — Ambulatory Visit (INDEPENDENT_AMBULATORY_CARE_PROVIDER_SITE_OTHER): Payer: 59 | Admitting: Family Medicine

## 2022-09-24 ENCOUNTER — Encounter: Payer: Self-pay | Admitting: Family Medicine

## 2022-09-24 VITALS — BP 118/84 | HR 71 | Ht 62.0 in | Wt 197.0 lb

## 2022-09-24 DIAGNOSIS — G4733 Obstructive sleep apnea (adult) (pediatric): Secondary | ICD-10-CM | POA: Diagnosis not present

## 2022-09-25 ENCOUNTER — Other Ambulatory Visit: Payer: Self-pay

## 2022-09-26 ENCOUNTER — Other Ambulatory Visit: Payer: Self-pay

## 2022-09-30 ENCOUNTER — Other Ambulatory Visit: Payer: Self-pay

## 2022-10-07 ENCOUNTER — Encounter: Payer: Self-pay | Admitting: Physician Assistant

## 2022-10-08 ENCOUNTER — Other Ambulatory Visit: Payer: Self-pay | Admitting: Physician Assistant

## 2022-10-08 ENCOUNTER — Other Ambulatory Visit: Payer: Self-pay

## 2022-10-08 DIAGNOSIS — R110 Nausea: Secondary | ICD-10-CM

## 2022-10-08 MED ORDER — ONDANSETRON HCL 4 MG PO TABS
4.0000 mg | ORAL_TABLET | Freq: Three times a day (TID) | ORAL | 0 refills | Status: AC | PRN
  Filled 2022-10-08: qty 20, 7d supply, fill #0

## 2022-10-11 ENCOUNTER — Other Ambulatory Visit: Payer: Self-pay

## 2022-10-18 ENCOUNTER — Other Ambulatory Visit: Payer: Self-pay | Admitting: Physician Assistant

## 2022-10-18 ENCOUNTER — Other Ambulatory Visit: Payer: Self-pay

## 2022-10-18 DIAGNOSIS — E669 Obesity, unspecified: Secondary | ICD-10-CM

## 2022-10-18 MED ORDER — ZEPBOUND 5 MG/0.5ML ~~LOC~~ SOAJ
5.0000 mg | SUBCUTANEOUS | 1 refills | Status: DC
  Filled 2022-10-18: qty 2, 28d supply, fill #0
  Filled 2022-11-18 (×2): qty 2, 28d supply, fill #1

## 2022-10-20 ENCOUNTER — Other Ambulatory Visit: Payer: Self-pay

## 2022-10-21 ENCOUNTER — Other Ambulatory Visit: Payer: Self-pay

## 2022-11-18 ENCOUNTER — Other Ambulatory Visit: Payer: Self-pay

## 2022-11-18 DIAGNOSIS — T887XXA Unspecified adverse effect of drug or medicament, initial encounter: Secondary | ICD-10-CM | POA: Diagnosis not present

## 2022-11-18 DIAGNOSIS — E663 Overweight: Secondary | ICD-10-CM | POA: Diagnosis not present

## 2022-11-19 ENCOUNTER — Other Ambulatory Visit: Payer: Self-pay

## 2022-11-20 ENCOUNTER — Other Ambulatory Visit: Payer: Self-pay

## 2022-11-23 DIAGNOSIS — G4733 Obstructive sleep apnea (adult) (pediatric): Secondary | ICD-10-CM | POA: Diagnosis not present

## 2022-12-23 DIAGNOSIS — G4733 Obstructive sleep apnea (adult) (pediatric): Secondary | ICD-10-CM | POA: Diagnosis not present

## 2022-12-27 ENCOUNTER — Other Ambulatory Visit: Payer: Self-pay

## 2022-12-30 ENCOUNTER — Other Ambulatory Visit: Payer: Self-pay

## 2022-12-30 ENCOUNTER — Other Ambulatory Visit: Payer: Self-pay | Admitting: Physician Assistant

## 2022-12-30 DIAGNOSIS — E669 Obesity, unspecified: Secondary | ICD-10-CM

## 2022-12-30 MED FILL — Tirzepatide (Weight Mngmt) Soln Auto-Injector 5 MG/0.5ML: SUBCUTANEOUS | 28 days supply | Qty: 2 | Fill #0 | Status: CN

## 2022-12-31 ENCOUNTER — Other Ambulatory Visit: Payer: Self-pay

## 2023-01-01 ENCOUNTER — Other Ambulatory Visit: Payer: Self-pay

## 2023-01-01 ENCOUNTER — Other Ambulatory Visit: Payer: Self-pay | Admitting: Physician Assistant

## 2023-01-01 DIAGNOSIS — E669 Obesity, unspecified: Secondary | ICD-10-CM

## 2023-01-01 MED ORDER — ZEPBOUND 7.5 MG/0.5ML ~~LOC~~ SOAJ
7.5000 mg | SUBCUTANEOUS | 1 refills | Status: DC
  Filled 2023-01-01: qty 2, 28d supply, fill #0

## 2023-01-23 DIAGNOSIS — G4733 Obstructive sleep apnea (adult) (pediatric): Secondary | ICD-10-CM | POA: Diagnosis not present

## 2023-01-24 DIAGNOSIS — Z08 Encounter for follow-up examination after completed treatment for malignant neoplasm: Secondary | ICD-10-CM | POA: Diagnosis not present

## 2023-01-24 DIAGNOSIS — D2271 Melanocytic nevi of right lower limb, including hip: Secondary | ICD-10-CM | POA: Diagnosis not present

## 2023-01-24 DIAGNOSIS — D2261 Melanocytic nevi of right upper limb, including shoulder: Secondary | ICD-10-CM | POA: Diagnosis not present

## 2023-01-24 DIAGNOSIS — L814 Other melanin hyperpigmentation: Secondary | ICD-10-CM | POA: Diagnosis not present

## 2023-01-24 DIAGNOSIS — D2272 Melanocytic nevi of left lower limb, including hip: Secondary | ICD-10-CM | POA: Diagnosis not present

## 2023-01-24 DIAGNOSIS — Z85828 Personal history of other malignant neoplasm of skin: Secondary | ICD-10-CM | POA: Diagnosis not present

## 2023-01-24 DIAGNOSIS — D225 Melanocytic nevi of trunk: Secondary | ICD-10-CM | POA: Diagnosis not present

## 2023-01-24 DIAGNOSIS — D2262 Melanocytic nevi of left upper limb, including shoulder: Secondary | ICD-10-CM | POA: Diagnosis not present

## 2023-02-04 ENCOUNTER — Encounter: Payer: Self-pay | Admitting: Physician Assistant

## 2023-02-05 ENCOUNTER — Other Ambulatory Visit: Payer: Self-pay | Admitting: Physician Assistant

## 2023-02-05 DIAGNOSIS — I8390 Asymptomatic varicose veins of unspecified lower extremity: Secondary | ICD-10-CM

## 2023-02-26 ENCOUNTER — Other Ambulatory Visit: Payer: Self-pay | Admitting: Family Medicine

## 2023-02-26 ENCOUNTER — Other Ambulatory Visit: Payer: Self-pay

## 2023-02-26 DIAGNOSIS — I10 Essential (primary) hypertension: Secondary | ICD-10-CM

## 2023-02-27 ENCOUNTER — Other Ambulatory Visit: Payer: Self-pay | Admitting: Family Medicine

## 2023-02-27 ENCOUNTER — Other Ambulatory Visit: Payer: Self-pay

## 2023-02-27 DIAGNOSIS — I10 Essential (primary) hypertension: Secondary | ICD-10-CM

## 2023-02-27 NOTE — Telephone Encounter (Signed)
Requested medication (s) are due for refill today: -  Requested medication (s) are on the active medication list:yes but expired 02/18/23  Last refill:  07/23/22  Future visit scheduled:yes  Notes to clinic:  expired med   Requested Prescriptions  Pending Prescriptions Disp Refills   metoprolol succinate (TOPROL-XL) 25 MG 24 hr tablet 90 tablet 1    Sig: TAKE 1 TABLET BY MOUTH DAILY     Cardiovascular:  Beta Blockers Failed - 02/26/2023  5:07 PM      Failed - Valid encounter within last 6 months    Recent Outpatient Visits           7 months ago Encounter for annual physical exam   [facility] Erasmo Downer, MD   8 months ago Obesity (BMI 30-39.9)   [facility] Alfredia Ferguson, [place]   11 months ago Essential hypertension   [facility] Alfredia Ferguson, [place]   1 year ago Nausea and vomiting, unspecified vomiting type   [facility] Alfredia Ferguson, PA-C   1 year ago Essential hypertension    [facility] Alfredia Ferguson, PA-C       Future Appointments             In 4 months Drubel, [facility]            Passed - Last BP in normal range    BP Readings from Last 1 Encounters:  09/24/22 118/84         Passed - Last Heart Rate in normal range    Pulse Readings from Last 1 Encounters:  09/24/22 71

## 2023-02-28 ENCOUNTER — Other Ambulatory Visit: Payer: Self-pay

## 2023-02-28 NOTE — Telephone Encounter (Signed)
Requested medication (s) are due for refill today:   Rx expired 02/18/2023  Requested medication (s) are on the active medication list:   Yes  Future visit scheduled:   No  Last seen by Dr. Beryle Flock   Last ordered: 02/27/2023 #90,1 by Merita Norton, FNP  Got a duplicate request however Robynn Pane ordered this on 02/27/2023 yesterday.      Requested Prescriptions  Pending Prescriptions Disp Refills   metoprolol succinate (TOPROL-XL) 25 MG 24 hr tablet 90 tablet 1    Sig: TAKE 1 TABLET BY MOUTH DAILY     Cardiovascular:  Beta Blockers Failed - 02/27/2023  2:50 PM      Failed - Valid encounter within last 6 months    Recent Outpatient Visits           7 months ago Encounter for annual physical exam   [facility] Erasmo Downer, [place]   8 months ago Obesity (BMI 30-39.9)   [facility] Alfredia Ferguson, [place]   11 months ago Essential hypertension   [facility] Alfredia Ferguson, [place]   1 year ago Nausea and vomiting, unspecified vomiting type   [facility] Alfredia Ferguson, PA-C   1 year ago Essential hypertension   [facility] Alfredia Ferguson, PA-C       Future Appointments             In 4 months Drubel, [facility]            Passed - Last BP in normal range    BP Readings from Last 1 Encounters:  09/24/22 118/84         Passed - Last Heart Rate in normal range    Pulse Readings from Last 1 Encounters:  09/24/22 71

## 2023-03-02 ENCOUNTER — Other Ambulatory Visit: Payer: Self-pay

## 2023-03-03 ENCOUNTER — Other Ambulatory Visit: Payer: Self-pay

## 2023-03-03 DIAGNOSIS — G4733 Obstructive sleep apnea (adult) (pediatric): Secondary | ICD-10-CM | POA: Diagnosis not present

## 2023-03-07 ENCOUNTER — Other Ambulatory Visit: Payer: Self-pay

## 2023-03-07 NOTE — Telephone Encounter (Signed)
Pt is calling to check on the status of her mediation refill that was denied. Please advise CB- [phone]

## 2023-03-08 NOTE — Progress Notes (Signed)
MRN : [id number]  Audrey Koch is a 52 y.o. ([birth year]) female who presents with chief complaint of varicose veins hurt.  History of Present Illness:  The patient is seen for evaluation of symptomatic varicose veins. The patient relates burning and stinging which worsened steadily throughout the course of the day, particularly with standing. The patient also notes an aching and throbbing pain over the varicosities, particularly with prolonged dependent positions. The symptoms are significantly improved with elevation.  The patient also notes that during hot weather the symptoms are greatly intensified. The patient states the pain from the varicose veins interferes with work, daily exercise, shopping and household maintenance. At this point, the symptoms are persistent and severe enough that they're having a negative impact on lifestyle and are interfering with daily activities.  There is no history of DVT, PE or superficial thrombophlebitis. There is no history of ulceration or hemorrhage. The patient denies a significant family history of varicose veins.  The patient has been wearing graduated compression on a daily bsis for years. At the present time the patient has been been using over-the-counter analgesics but these do not help.  She has also been elevating on a routine basis but again finds this helpful only when she is able to do it as soon as she is back on her feet her symptoms recur.  There is no history of prior surgical intervention or sclerotherapy.    No outpatient medications have been marked as taking for the 03/10/23 encounter (Appointment) with Gilda Crease, Latina Craver, MD.    Past Medical History:  Diagnosis Date   Anxiety    History of kidney stones    Hyperlipidemia    Hypertension    Migraine    Pre-diabetes    Sleep apnea     Past Surgical History:  Procedure Laterality Date   COLONOSCOPY WITH PROPOFOL N/A 06/04/2021   Procedure: COLONOSCOPY WITH  PROPOFOL;  Surgeon: Toney Reil, MD;  Location: [facility] ENDOSCOPY;  Service: Gastroenterology;  Laterality: N/A;   EVALUATION UNDER ANESTHESIA WITH HEMORRHOIDECTOMY N/A 01/16/2022   Procedure: EXAM UNDER ANESTHESIA WITH HEMORRHOIDECTOMY of 2 + columns;  Surgeon: Henrene Dodge, MD;  Location: [facility] ORS;  Service: General;  Laterality: N/A;   FINGER FRACTURE SURGERY Right 2016   ring finger   HEMORROIDECTOMY  10/08/2013    Social History Social History   Tobacco Use   Smoking status: Never   Smokeless tobacco: Never  Vaping Use   Vaping status: Never Used  Substance Use Topics   Alcohol use: No   Drug use: No    Family History Family History  Problem Relation Age of Onset   Breast cancer Mother    Heart attack Mother    Hyperlipidemia Mother    Dementia Maternal Grandmother    Diabetes Maternal Grandfather    Dementia Paternal Grandmother    Diabetes Paternal Grandfather    Esophageal cancer Paternal Grandfather    Diabetes Brother     Allergies  Allergen Reactions   Phentermine Hcl Other (See Comments)    Elevated B/P   Spironolactone Rash   Sulfa Antibiotics Rash     REVIEW OF SYSTEMS (Negative unless checked)  Constitutional: [] Weight loss  [] Fever  [] Chills Cardiac: [] Chest pain   [] Chest pressure   [] Palpitations   [] Shortness of breath when laying flat   [] Shortness of breath with exertion. Vascular:  [] Pain in legs with walking   [  x]Pain in legs with standing  [] History of DVT   [] Phlebitis   [] Swelling in legs   [x] Varicose veins   [] Non-healing ulcers Pulmonary:   [] Uses home oxygen   [] Productive cough   [] Hemoptysis   [] Wheeze  [] COPD   [] Asthma Neurologic:  [] Dizziness   [] Seizures   [] History of stroke   [] History of TIA  [] Aphasia   [] Vissual changes   [] Weakness or numbness in arm   [] Weakness or numbness in leg Musculoskeletal:   [] Joint swelling   [] Joint pain   [] Low back pain Hematologic:  [] Easy bruising  [] Easy bleeding   [] Hypercoagulable  state   [] Anemic Gastrointestinal:  [] Diarrhea   [] Vomiting  [] Gastroesophageal reflux/heartburn   [] Difficulty swallowing. Genitourinary:  [] Chronic kidney disease   [] Difficult urination  [] Frequent urination   [] Blood in urine Skin:  [] Rashes   [] Ulcers  Psychological:  [] History of anxiety   []  History of major depression.  Physical Examination  There were no vitals filed for this visit. There is no height or weight on file to calculate BMI. Gen: WD/WN, NAD Head: [place]/AT, No temporalis wasting.  Ear/Nose/Throat: Hearing grossly intact, nares w/o erythema or drainage, pinna without lesions Eyes: PER, EOMI, sclera nonicteric.  Neck: Supple, no gross masses.  No JVD.  Pulmonary:  Good air movement, no audible wheezing, no use of accessory muscles.  Cardiac: RRR, precordium not hyperdynamic. Vascular:  Large varicosities present, greater than 10 mm bilaterally.  Veins are tender to palpation  Mild venous stasis changes to the legs bilaterally.  Trace soft pitting edema CEAP C3sEpAsPr Vessel Right Left  Radial Palpable Palpable  Gastrointestinal: soft, non-distended. No guarding/no peritoneal signs.  Musculoskeletal: M/S 5/5 throughout.  No deformity.  Neurologic: CN 2-12 intact. Pain and light touch intact in extremities.  Symmetrical.  Speech is fluent. Motor exam as listed above. Psychiatric: Judgment intact, Mood & affect appropriate for pt's clinical situation. Dermatologic: Venous rashes no ulcers noted.  No changes consistent with cellulitis. Lymph : No lichenification or skin changes of chronic lymphedema.  CBC Lab Results  Component Value Date   WBC 7.4 01/11/2022   HGB 14.0 01/11/2022   HCT 43.7 01/11/2022   MCV 87.4 01/11/2022   PLT 333 01/11/2022    BMET    Component Value Date/Time   NA 141 03/19/2022 0810   NA 142 12/10/2011 0922   K 4.3 03/19/2022 0810   K 3.7 12/10/2011 0922   CL 105 03/19/2022 0810   CL 108 (H) 12/10/2011 0922   CO2 20 03/19/2022 0810    CO2 26 12/10/2011 0922   GLUCOSE 98 03/19/2022 0810   GLUCOSE 93 01/11/2022 0928   GLUCOSE 80 12/10/2011 0922   BUN 13 03/19/2022 0810   BUN 15 12/10/2011 0922   CREATININE 0.90 03/19/2022 0810   CREATININE 0.75 12/10/2011 0922   CALCIUM 9.0 03/19/2022 0810   CALCIUM 8.2 (L) 12/10/2011 0922   GFRNONAA >60 01/11/2022 0928   GFRNONAA >60 12/10/2011 0922   GFRAA 82 11/10/2019 0807   GFRAA >60 12/10/2011 0922   CrCl cannot be calculated (Patient's most recent lab result is older than the maximum 21 days allowed.).  COAG No results found for: "INR", "PROTIME"  Radiology No results found.   Assessment/Plan 1. Varicose veins of leg with pain, bilateral  Recommend:  The patient is complaining of symptomatic varicose veins.  The patient describes them as painful, associated with swelling of both feet and ankles.  The patient notes  this is interfering with daily  activities and lifestyle.  I have had a long discussion with the patient regarding  varicose veins and why they cause symptoms.  Patient will continue to wear graduated compression stockings class 1 on a daily basis, beginning first thing in the morning and removing them in the evening. The patient is instructed specifically not to sleep in the stockings.    The patient  will also continue using over-the-counter analgesics such as Motrin 600 mg po TID to help control the symptoms.    In addition, behavioral modification including elevation during the day will be continued.  The patient is also instructed to continue exercising, such as walking, 4-5 times per week.  Pending the results of these changes the  patient will be reevaluated in 1 month at which time an  ultrasound of the venous system can be obtained if the symptoms are persistent.  Further plans will be based on the benefits of conservative therapy and the ultrasound results.  - VAS [place] LOWER EXTREMITY VENOUS REFLUX; Future  2. Essential hypertension Continue  antihypertensive medications as already ordered, these medications have been reviewed and there are no changes at this time.  3. Moderate mixed hyperlipidemia not requiring statin therapy Continue statin as ordered and reviewed, no changes at this time    Levora Dredge, MD  03/08/2023 2:04 PM

## 2023-03-10 ENCOUNTER — Other Ambulatory Visit: Payer: Self-pay | Admitting: Family Medicine

## 2023-03-10 ENCOUNTER — Other Ambulatory Visit: Payer: Self-pay | Admitting: Physician Assistant

## 2023-03-10 ENCOUNTER — Encounter (INDEPENDENT_AMBULATORY_CARE_PROVIDER_SITE_OTHER): Payer: Self-pay | Admitting: Vascular Surgery

## 2023-03-10 ENCOUNTER — Ambulatory Visit (INDEPENDENT_AMBULATORY_CARE_PROVIDER_SITE_OTHER): Payer: 59 | Admitting: Vascular Surgery

## 2023-03-10 ENCOUNTER — Other Ambulatory Visit: Payer: Self-pay

## 2023-03-10 VITALS — BP 122/80 | HR 71 | Resp 16 | Ht 62.0 in | Wt 193.8 lb

## 2023-03-10 DIAGNOSIS — I10 Essential (primary) hypertension: Secondary | ICD-10-CM

## 2023-03-10 DIAGNOSIS — E782 Mixed hyperlipidemia: Secondary | ICD-10-CM | POA: Diagnosis not present

## 2023-03-10 DIAGNOSIS — I83813 Varicose veins of bilateral lower extremities with pain: Secondary | ICD-10-CM

## 2023-03-10 NOTE — Telephone Encounter (Signed)
Medication Refill - Medication: metoprolol succinate (TOPROL-XL) 25 MG 24 hr tablet   Has the patient contacted their pharmacy? Yes.   Pharmacy stated she needed to contact provider per it was rejected  Preferred Pharmacy (with phone number or street name):  [facility] Phone: [phone]  Fax: [phone]     Has the patient been seen for an appointment in the last year OR does the patient have an upcoming appointment? Yes.    Agent: Please be advised that RX refills may take up to 3 business days. We ask that you follow-up with your pharmacy.  Patient stated she has gone the weekend without her medication and have none for the today

## 2023-03-11 ENCOUNTER — Other Ambulatory Visit: Payer: Self-pay | Admitting: Family Medicine

## 2023-03-11 ENCOUNTER — Other Ambulatory Visit: Payer: Self-pay

## 2023-03-11 DIAGNOSIS — I10 Essential (primary) hypertension: Secondary | ICD-10-CM

## 2023-03-11 MED FILL — Metoprolol Succinate Tab ER 24HR 25 MG (Tartrate Equiv): ORAL | 30 days supply | Qty: 30 | Fill #0 | Status: AC

## 2023-03-11 NOTE — Telephone Encounter (Signed)
Requested Prescriptions  Refused Prescriptions Disp Refills   metoprolol succinate (TOPROL-XL) 25 MG 24 hr tablet 90 tablet 1    Sig: TAKE 1 TABLET BY MOUTH DAILY     Cardiovascular:  Beta Blockers Failed - 03/10/2023  9:00 AM      Failed - Valid encounter within last 6 months    Recent Outpatient Visits           8 months ago Encounter for annual physical exam   [facility] Erasmo Downer, MD   8 months ago Obesity (BMI 30-39.9)   [facility] Alfredia Ferguson, [place]   12 months ago Essential hypertension   [facility] Alfredia Ferguson, [place]   1 year ago Nausea and vomiting, unspecified vomiting type   [facility] Alfredia Ferguson, PA-C   1 year ago Essential hypertension   [facility] Alfredia Ferguson, PA-C       Future Appointments             In 4 months Drubel, Lou Cal [facility]            Passed - Last BP in normal range    BP Readings from Last 1 Encounters:  03/10/23 122/80         Passed - Last Heart Rate in normal range    Pulse Readings from Last 1 Encounters:  03/10/23 71

## 2023-03-13 ENCOUNTER — Ambulatory Visit: Payer: Commercial Managed Care - PPO | Admitting: Occupational Therapy

## 2023-03-16 ENCOUNTER — Encounter (INDEPENDENT_AMBULATORY_CARE_PROVIDER_SITE_OTHER): Payer: Self-pay | Admitting: Vascular Surgery

## 2023-03-21 ENCOUNTER — Ambulatory Visit (INDEPENDENT_AMBULATORY_CARE_PROVIDER_SITE_OTHER): Payer: 59

## 2023-03-21 DIAGNOSIS — I83813 Varicose veins of bilateral lower extremities with pain: Secondary | ICD-10-CM | POA: Diagnosis not present

## 2023-03-27 ENCOUNTER — Ambulatory Visit (INDEPENDENT_AMBULATORY_CARE_PROVIDER_SITE_OTHER): Payer: 59 | Admitting: Vascular Surgery

## 2023-03-27 ENCOUNTER — Encounter (INDEPENDENT_AMBULATORY_CARE_PROVIDER_SITE_OTHER): Payer: Self-pay | Admitting: Vascular Surgery

## 2023-03-27 VITALS — BP 119/78 | HR 79 | Resp 18 | Ht 62.0 in | Wt 195.6 lb

## 2023-03-27 DIAGNOSIS — E782 Mixed hyperlipidemia: Secondary | ICD-10-CM

## 2023-03-27 DIAGNOSIS — I10 Essential (primary) hypertension: Secondary | ICD-10-CM

## 2023-03-27 DIAGNOSIS — I83813 Varicose veins of bilateral lower extremities with pain: Secondary | ICD-10-CM | POA: Diagnosis not present

## 2023-03-30 ENCOUNTER — Encounter (INDEPENDENT_AMBULATORY_CARE_PROVIDER_SITE_OTHER): Payer: Self-pay | Admitting: Vascular Surgery

## 2023-03-30 NOTE — Progress Notes (Signed)
MRN : [id number]  Audrey Koch is a 52 y.o. (Mar 27, 1971) female who presents with chief complaint of varicose veins hurt.  History of Present Illness:   The patient returns for followup evaluation many months after the initial visit. The patient continues to have pain in the lower extremities with dependency. The pain is lessened with elevation. Graduated compression stockings, Class I (20-30 mmHg), have been worn but the stockings do not eliminate the leg pain. Over-the-counter analgesics do not improve the symptoms. The degree of discomfort continues to interfere with daily activities. The patient notes the pain in the legs is causing problems with daily exercise, at the workplace and even with household activities and maintenance such as standing in the kitchen preparing meals and doing dishes.   Venous ultrasound shows normal deep venous system, no evidence of acute or chronic DVT.  Superficial reflux is present in the left great saphenous vein  Current Meds  Medication Sig   acetaminophen (TYLENOL) 500 MG tablet Take 2 tablets (1,000 mg total) by mouth every 6 (six) hours as needed for mild pain.   Blood Pressure Monitoring (OMRON 3 SERIES BP MONITOR) DEVI use to check blood pressure   Cholecalciferol (VITAMIN D3) 250 MCG (10000 UT) capsule 1 capsule   ibuprofen (ADVIL) 400 MG tablet 1 tablet with food or milk as needed   lidocaine (XYLOCAINE) 5 % ointment Apply 1 Application topically 4 (four) times daily as needed for mild pain.   metoprolol succinate (TOPROL-XL) 25 MG 24 hr tablet Take 1 tablet (25 mg total) by mouth daily.   mometasone (NASONEX) 50 MCG/ACT nasal spray Place 2 sprays into the nose daily.   Multiple Vitamins-Minerals (VITAMIN D3 COMPLETE PO) Take by mouth.   ondansetron (ZOFRAN) 4 MG tablet Take 1 tablet (4 mg total) by mouth every 8 (eight) hours as needed for nausea or vomiting.    Past Medical History:  Diagnosis Date   Anxiety    History of  kidney stones    Hyperlipidemia    Hypertension    Migraine    Pre-diabetes    Sleep apnea     Past Surgical History:  Procedure Laterality Date   COLONOSCOPY WITH PROPOFOL N/A 06/04/2021   Procedure: COLONOSCOPY WITH PROPOFOL;  Surgeon: Toney Reil, MD;  Location: [facility] ENDOSCOPY;  Service: Gastroenterology;  Laterality: N/A;   EVALUATION UNDER ANESTHESIA WITH HEMORRHOIDECTOMY N/A 01/16/2022   Procedure: EXAM UNDER ANESTHESIA WITH HEMORRHOIDECTOMY of 2 + columns;  Surgeon: Henrene Dodge, MD;  Location: [facility] ORS;  Service: General;  Laterality: N/A;   FINGER FRACTURE SURGERY Right 2016   ring finger   HEMORROIDECTOMY  10/08/2013    Social History Social History   Tobacco Use   Smoking status: Never   Smokeless tobacco: Never  Vaping Use   Vaping status: Never Used  Substance Use Topics   Alcohol use: No   Drug use: No    Family History Family History  Problem Relation Age of Onset   Breast cancer Mother    Heart attack Mother    Hyperlipidemia Mother    Dementia Maternal Grandmother    Diabetes Maternal Grandfather    Dementia Paternal Grandmother    Diabetes Paternal Grandfather    Esophageal cancer Paternal Grandfather    Diabetes Brother     Allergies  Allergen Reactions   Phentermine Hcl Other (See Comments)    Elevated B/P  Other Reaction(s): rapid  heart rate   Spironolactone Rash   Sulfa Antibiotics Rash     REVIEW OF SYSTEMS (Negative unless checked)  Constitutional: [] Weight loss  [] Fever  [] Chills Cardiac: [] Chest pain   [] Chest pressure   [] Palpitations   [] Shortness of breath when laying flat   [] Shortness of breath with exertion. Vascular:  [] Pain in legs with walking   [x] Pain in legs with standing  [] History of DVT   [] Phlebitis   [] Swelling in legs   [x] Varicose veins   [] Non-healing ulcers Pulmonary:   [] Uses home oxygen   [] Productive cough   [] Hemoptysis   [] Wheeze  [] COPD   [] Asthma Neurologic:  [] Dizziness   [] Seizures    [] History of stroke   [] History of TIA  [] Aphasia   [] Vissual changes   [] Weakness or numbness in arm   [] Weakness or numbness in leg Musculoskeletal:   [] Joint swelling   [] Joint pain   [] Low back pain Hematologic:  [] Easy bruising  [] Easy bleeding   [] Hypercoagulable state   [] Anemic Gastrointestinal:  [] Diarrhea   [] Vomiting  [] Gastroesophageal reflux/heartburn   [] Difficulty swallowing. Genitourinary:  [] Chronic kidney disease   [] Difficult urination  [] Frequent urination   [] Blood in urine Skin:  [] Rashes   [] Ulcers  Psychological:  [] History of anxiety   []  History of major depression.  Physical Examination  Vitals:   03/27/23 1549  BP: 119/78  Pulse: 79  Resp: 18  Weight: 195 lb 9.6 oz (88.7 kg)  Height: 5\' 2"  (1.575 m)   Body mass index is 35.78 kg/m. Gen: WD/WN, NAD Head: [place]/AT, No temporalis wasting.  Ear/Nose/Throat: Hearing grossly intact, nares w/o erythema or drainage, pinna without lesions Eyes: PER, EOMI, sclera nonicteric.  Neck: Supple, no gross masses.  No JVD.  Pulmonary:  Good air movement, no audible wheezing, no use of accessory muscles.  Cardiac: RRR, precordium not hyperdynamic. Vascular:  Large varicosities present, greater than 10 mm left.  Veins are tender to palpation  Mild venous stasis changes to the legs bilaterally.  Trace soft pitting edema CEAP C3sEpAsPr Vessel Right Left  Radial Palpable Palpable  Gastrointestinal: soft, non-distended. No guarding/no peritoneal signs.  Musculoskeletal: M/S 5/5 throughout.  No deformity.  Neurologic: CN 2-12 intact. Pain and light touch intact in extremities.  Symmetrical.  Speech is fluent. Motor exam as listed above. Psychiatric: Judgment intact, Mood & affect appropriate for pt's clinical situation. Dermatologic: Venous rashes no ulcers noted.  No changes consistent with cellulitis. Lymph : No lichenification or skin changes of chronic lymphedema.  CBC Lab Results  Component Value Date   WBC 7.4 01/11/2022    HGB 14.0 01/11/2022   HCT 43.7 01/11/2022   MCV 87.4 01/11/2022   PLT 333 01/11/2022    BMET    Component Value Date/Time   NA 141 03/19/2022 0810   NA 142 12/10/2011 0922   K 4.3 03/19/2022 0810   K 3.7 12/10/2011 0922   CL 105 03/19/2022 0810   CL 108 (H) 12/10/2011 0922   CO2 20 03/19/2022 0810   CO2 26 12/10/2011 0922   GLUCOSE 98 03/19/2022 0810   GLUCOSE 93 01/11/2022 0928   GLUCOSE 80 12/10/2011 0922   BUN 13 03/19/2022 0810   BUN 15 12/10/2011 0922   CREATININE 0.90 03/19/2022 0810   CREATININE 0.75 12/10/2011 0922   CALCIUM 9.0 03/19/2022 0810   CALCIUM 8.2 (L) 12/10/2011 0922   GFRNONAA >60 01/11/2022 0928   GFRNONAA >60 12/10/2011 0922   GFRAA 82 11/10/2019 0807   GFRAA >60 12/10/2011  8119   CrCl cannot be calculated (Patient's most recent lab result is older than the maximum 21 days allowed.).  COAG No results found for: "INR", "PROTIME"  Radiology VAS [place] LOWER EXTREMITY VENOUS REFLUX  Result Date: 03/24/2023  Lower Venous Reflux Study Patient Name:  TESNEEM DRIVER  Date of Exam:   03/21/2023 Medical Rec #: [id number]     Accession #:    [id number] Date of Birth: 02-16-1971      Patient Gender: F Patient Age:   47 years Exam Location:  [facility] Procedure:      VAS [place] LOWER EXTREMITY VENOUS REFLUX Referring Phys: Levora Dredge --------------------------------------------------------------------------------  Indications: Varicosities.  Comparison Study: 2020 - negative study Performing Technologist: Salvadore Farber RVT  Examination Guidelines: A complete evaluation includes B-mode imaging, spectral Doppler, color Doppler, and power Doppler as needed of all accessible portions of each vessel. Bilateral testing is considered an integral part of a complete examination. Limited examinations for reoccurring indications may be performed as noted. The reflux portion of the exam is performed with the patient in reverse Trendelenburg. Significant venous reflux is  defined as >500 ms in the superficial venous system, and >1 second in the deep venous system.  +--------------+---------+------+-----------+------------+--------+ LEFT          Reflux NoRefluxReflux TimeDiameter cmsComments                         Yes                                  +--------------+---------+------+-----------+------------+--------+ GSV at SFJ              yes    >500 ms      .49              +--------------+---------+------+-----------+------------+--------+ GSV prox thigh          yes    >500 ms      .35              +--------------+---------+------+-----------+------------+--------+ GSV mid thigh           yes    >500 ms      .51              +--------------+---------+------+-----------+------------+--------+ GSV dist thigh          yes    >500 ms      .51              +--------------+---------+------+-----------+------------+--------+ GSV at knee   no                                             +--------------+---------+------+-----------+------------+--------+ GSV prox calf no                                             +--------------+---------+------+-----------+------------+--------+   Summary: Bilateral: - No evidence of deep vein thrombosis seen in the lower extremities, bilaterally, from the common femoral through the popliteal veins. - No evidence of superficial venous thrombosis in the lower extremities, bilaterally.  Right: - There is no evidence of venous reflux seen in the right lower extremity.  Left: - Venous  reflux is noted in the left sapheno-femoral junction. - Venous reflux is noted in the left greater saphenous vein in the thigh.  *See table(s) above for measurements and observations. Electronically signed by Levora Dredge MD on 03/24/2023 at 12:22:19 PM.    Final      Assessment/Plan 1. Varicose veins of leg with pain, bilateral Recommend  I have reviewed my previous  discussion with the patient regarding   varicose veins and why they cause symptoms. Patient will continue  wearing graduated compression stockings class 1 on a daily basis, beginning first thing in the morning and removing them in the evening.  The patient is CEAP C3sEpAsPr.  The patient has been wearing compression for more than 12 weeks with no or little benefit.  The patient has been exercising daily for more than 12 weeks. The patient has been elevating and taking OTC pain medications for more than 12 weeks.  None of these have have eliminated the pain related to the varicose veins and venous reflux or the discomfort regarding venous congestion.    In addition, behavioral modification including elevation during the day was again discussed and this will continue.  The patient has utilized over the counter pain medications and has been exercising.  However, at this time conservative therapy has not alleviated the patient's symptoms of leg pain and swelling  Recommend: laser ablation of the left great saphenous vein to eliminate the symptoms of pain and swelling of the lower extremities caused by the severe superficial venous reflux disease.   2. Moderate mixed hyperlipidemia not requiring statin therapy Continue statin as ordered and reviewed, no changes at this time  3. Essential hypertension Continue antihypertensive medications as already ordered, these medications have been reviewed and there are no changes at this time.    Levora Dredge, MD  03/30/2023 3:53 PM

## 2023-04-03 ENCOUNTER — Other Ambulatory Visit: Payer: Self-pay

## 2023-04-03 ENCOUNTER — Other Ambulatory Visit: Payer: Self-pay | Admitting: Family Medicine

## 2023-04-03 DIAGNOSIS — G4733 Obstructive sleep apnea (adult) (pediatric): Secondary | ICD-10-CM | POA: Diagnosis not present

## 2023-04-03 DIAGNOSIS — I10 Essential (primary) hypertension: Secondary | ICD-10-CM

## 2023-04-03 MED ORDER — METOPROLOL SUCCINATE ER 25 MG PO TB24
25.0000 mg | ORAL_TABLET | Freq: Every day | ORAL | 0 refills | Status: DC
  Filled 2023-04-03: qty 30, 30d supply, fill #0

## 2023-04-04 ENCOUNTER — Other Ambulatory Visit: Payer: Self-pay

## 2023-04-21 ENCOUNTER — Other Ambulatory Visit: Payer: Self-pay

## 2023-05-03 ENCOUNTER — Other Ambulatory Visit: Payer: Self-pay | Admitting: Family Medicine

## 2023-05-03 DIAGNOSIS — I10 Essential (primary) hypertension: Secondary | ICD-10-CM

## 2023-05-03 DIAGNOSIS — G4733 Obstructive sleep apnea (adult) (pediatric): Secondary | ICD-10-CM | POA: Diagnosis not present

## 2023-05-04 ENCOUNTER — Other Ambulatory Visit: Payer: Self-pay | Admitting: Family Medicine

## 2023-05-04 ENCOUNTER — Other Ambulatory Visit: Payer: Self-pay

## 2023-05-04 DIAGNOSIS — I10 Essential (primary) hypertension: Secondary | ICD-10-CM

## 2023-05-05 NOTE — Telephone Encounter (Signed)
Requested medication (s) are due for refill today: routing for review  Requested medication (s) are on the active medication list: yes  Last refill:  04/03/23  Future visit scheduled: no  Notes to clinic:  Unable to refill per protocol, last refill by another provider, no longer at this practice.     Requested Prescriptions  Pending Prescriptions Disp Refills   metoprolol succinate (TOPROL-XL) 25 MG 24 hr tablet 30 tablet 0    Sig: Take 1 tablet (25 mg total) by mouth daily.     Cardiovascular:  Beta Blockers Failed - 05/04/2023  9:30 PM      Failed - Valid encounter within last 6 months    Recent Outpatient Visits           10 months ago Encounter for annual physical exam   [place] [facility] [place], Marzella Schlein, [place]   10 months ago Obesity (BMI 30-39.9)   [facility] Alfredia Ferguson, [place]   1 year ago Essential hypertension    [facility] Alfredia Ferguson, [place]   1 year ago Nausea and vomiting, unspecified vomiting type   [facility] Alfredia Ferguson, PA-C   1 year ago Essential hypertension    [facility] Alfredia Ferguson, PA-C       Future Appointments             In 2 months Drubel, Lou Cal [facility]            Passed - Last BP in normal range    BP Readings from Last 1 Encounters:  03/27/23 119/78         Passed - Last Heart Rate in normal range    Pulse Readings from Last 1 Encounters:  03/27/23 79

## 2023-05-06 ENCOUNTER — Telehealth: Payer: Self-pay

## 2023-05-06 ENCOUNTER — Other Ambulatory Visit: Payer: Self-pay

## 2023-05-06 MED FILL — Metoprolol Succinate Tab ER 24HR 25 MG (Tartrate Equiv): ORAL | 30 days supply | Qty: 30 | Fill #0 | Status: AC

## 2023-05-06 NOTE — Telephone Encounter (Signed)
Copied from CRM [id number]. Topic: General - Other >> May 06, 2023  9:49 AM [name] wrote: [name], I apologize I could not hold any longer.  I rescheduled Audrey Koch with Dr B, same date/same time, but I told her you would call her if she could or could not be her primary care provider.  Also, she would like a call regarding medication, she stated she never had to come in with [name] for medication evaluations & can she just wait until her physical in December to come in.

## 2023-05-07 NOTE — Telephone Encounter (Signed)
LVMTCB. CRM created. Ok for [facility] to advise/clarify.

## 2023-06-02 ENCOUNTER — Other Ambulatory Visit: Payer: Self-pay | Admitting: Family Medicine

## 2023-06-02 DIAGNOSIS — I10 Essential (primary) hypertension: Secondary | ICD-10-CM

## 2023-06-02 DIAGNOSIS — G4733 Obstructive sleep apnea (adult) (pediatric): Secondary | ICD-10-CM | POA: Diagnosis not present

## 2023-06-03 ENCOUNTER — Other Ambulatory Visit: Payer: Self-pay | Admitting: Family Medicine

## 2023-06-03 ENCOUNTER — Other Ambulatory Visit: Payer: Self-pay

## 2023-06-03 DIAGNOSIS — I10 Essential (primary) hypertension: Secondary | ICD-10-CM

## 2023-06-04 ENCOUNTER — Other Ambulatory Visit: Payer: Self-pay

## 2023-06-04 MED FILL — Metoprolol Succinate Tab ER 24HR 25 MG (Tartrate Equiv): ORAL | 30 days supply | Qty: 30 | Fill #0 | Status: AC

## 2023-06-04 NOTE — Telephone Encounter (Signed)
Requested medication (s) are due for refill today:yes  Requested medication (s) are on the active medication list: yes  Last refill:  05/06/23 #30  Future visit scheduled: yes  Notes to clinic:  chart indicates no PCP- was not sure who to use as cosigner   Requested Prescriptions  Pending Prescriptions Disp Refills   metoprolol succinate (TOPROL-XL) 25 MG 24 hr tablet 30 tablet 0    Sig: Take 1 tablet (25 mg total) by mouth daily.     Cardiovascular:  Beta Blockers Failed - 06/03/2023  9:08 AM      Failed - Valid encounter within last 6 months    Recent Outpatient Visits           11 months ago Encounter for annual physical exam   [place] [facility] Erasmo Downer, MD   11 months ago Obesity (BMI 30-39.9)   [place] [facility] Alfredia Ferguson, [place]   1 year ago Essential hypertension   [place] [facility] Alfredia Ferguson, [place]   1 year ago Nausea and vomiting, unspecified vomiting type   [facility] Alfredia Ferguson, PA-C   1 year ago Essential hypertension    [facility] Alfredia Ferguson, PA-C       Future Appointments             In 1 month Bacigalupo, Marzella Schlein, MD [facility]            Passed - Last BP in normal range    BP Readings from Last 1 Encounters:  03/27/23 119/78         Passed - Last Heart Rate in normal range    Pulse Readings from Last 1 Encounters:  03/27/23 79

## 2023-06-06 ENCOUNTER — Other Ambulatory Visit: Payer: Self-pay

## 2023-06-29 ENCOUNTER — Other Ambulatory Visit: Payer: Self-pay

## 2023-06-29 ENCOUNTER — Other Ambulatory Visit: Payer: Self-pay | Admitting: Family Medicine

## 2023-06-29 DIAGNOSIS — I10 Essential (primary) hypertension: Secondary | ICD-10-CM

## 2023-06-30 ENCOUNTER — Other Ambulatory Visit: Payer: Self-pay

## 2023-07-01 ENCOUNTER — Other Ambulatory Visit: Payer: Self-pay

## 2023-07-02 ENCOUNTER — Other Ambulatory Visit: Payer: Self-pay | Admitting: Family Medicine

## 2023-07-02 DIAGNOSIS — G4733 Obstructive sleep apnea (adult) (pediatric): Secondary | ICD-10-CM | POA: Diagnosis not present

## 2023-07-02 DIAGNOSIS — Z1231 Encounter for screening mammogram for malignant neoplasm of breast: Secondary | ICD-10-CM

## 2023-07-04 ENCOUNTER — Other Ambulatory Visit: Payer: Self-pay | Admitting: Family Medicine

## 2023-07-04 ENCOUNTER — Other Ambulatory Visit: Payer: Self-pay

## 2023-07-04 DIAGNOSIS — I10 Essential (primary) hypertension: Secondary | ICD-10-CM

## 2023-07-04 NOTE — Telephone Encounter (Signed)
Medication Refill -  Most Recent Primary Care Visit:  Provider: Erasmo Downer  Department: [facility]  Visit Type: PHYSICAL  Date: 07/08/2022  Medication: metoprolol succinate (TOPROL-XL) 25 MG 24 hr tablet [[id number]]   Has the patient contacted their pharmacy? Yes  (Agent: If yes, when and what did the pharmacy advise?) Contact PCP   Is this the correct pharmacy for this prescription? Yes  This is the patient's preferred pharmacy:    [facility] [address] [place] [address] Phone: [phone] Fax: [phone]   Has the prescription been filled recently? Yes  Is the patient out of the medication? Yes  Has the patient been seen for an appointment in the last year OR does the patient have an upcoming appointment? Yes  Can we respond through MyChart? Yes  Agent: Please be advised that Rx refills may take up to 3 business days. We ask that you follow-up with your pharmacy.  Pt's request was denied due to needing an appointment but pt has an appointment scheduled for 07/14/23, and is requesting a short supply of the medication until the appointment date.

## 2023-07-07 ENCOUNTER — Other Ambulatory Visit: Payer: Self-pay | Admitting: Family Medicine

## 2023-07-07 ENCOUNTER — Other Ambulatory Visit: Payer: Self-pay

## 2023-07-07 DIAGNOSIS — I10 Essential (primary) hypertension: Secondary | ICD-10-CM

## 2023-07-07 MED ORDER — METOPROLOL SUCCINATE ER 25 MG PO TB24
25.0000 mg | ORAL_TABLET | Freq: Every day | ORAL | 0 refills | Status: DC
  Filled 2023-07-07: qty 30, 30d supply, fill #0

## 2023-07-07 NOTE — Telephone Encounter (Signed)
Requested medication (s) are due for refill today: yes  Requested medication (s) are on the active medication list: yes  Last refill:  06/04/23 #30  Future visit scheduled: yes  Notes to clinic:  pt has been given 2 30 day refills already-    Requested Prescriptions  Pending Prescriptions Disp Refills   metoprolol succinate (TOPROL-XL) 25 MG 24 hr tablet 30 tablet 0    Sig: Take 1 tablet (25 mg total) by mouth daily.     Cardiovascular:  Beta Blockers Failed - 07/04/2023  1:43 PM      Failed - Valid encounter within last 6 months    Recent Outpatient Visits           12 months ago Encounter for annual physical exam   [place] [facility] [place], Marzella Schlein, MD   1 year ago Obesity (BMI 30-39.9)   [place] [facility] Alfredia Ferguson, [place]   1 year ago Essential hypertension   [place] [facility] Alfredia Ferguson, [place]   1 year ago Nausea and vomiting, unspecified vomiting type   [facility] Alfredia Ferguson, PA-C   1 year ago Essential hypertension   [facility] Alfredia Ferguson, PA-C       Future Appointments             In 1 week Bacigalupo, Marzella Schlein, MD [facility]            Passed - Last BP in normal range    BP Readings from Last 1 Encounters:  03/27/23 119/78         Passed - Last Heart Rate in normal range    Pulse Readings from Last 1 Encounters:  03/27/23 79

## 2023-07-14 ENCOUNTER — Encounter: Payer: Self-pay | Admitting: Family Medicine

## 2023-07-14 ENCOUNTER — Ambulatory Visit (INDEPENDENT_AMBULATORY_CARE_PROVIDER_SITE_OTHER): Payer: 59 | Admitting: Family Medicine

## 2023-07-14 ENCOUNTER — Encounter: Payer: Commercial Managed Care - PPO | Admitting: Physician Assistant

## 2023-07-14 ENCOUNTER — Other Ambulatory Visit: Payer: Self-pay

## 2023-07-14 VITALS — BP 119/82 | HR 71 | Ht 62.0 in | Wt 204.9 lb

## 2023-07-14 DIAGNOSIS — N951 Menopausal and female climacteric states: Secondary | ICD-10-CM

## 2023-07-14 DIAGNOSIS — G4733 Obstructive sleep apnea (adult) (pediatric): Secondary | ICD-10-CM

## 2023-07-14 DIAGNOSIS — Z0000 Encounter for general adult medical examination without abnormal findings: Secondary | ICD-10-CM | POA: Diagnosis not present

## 2023-07-14 DIAGNOSIS — I10 Essential (primary) hypertension: Secondary | ICD-10-CM | POA: Diagnosis not present

## 2023-07-14 DIAGNOSIS — B353 Tinea pedis: Secondary | ICD-10-CM | POA: Diagnosis not present

## 2023-07-14 DIAGNOSIS — E782 Mixed hyperlipidemia: Secondary | ICD-10-CM | POA: Diagnosis not present

## 2023-07-14 DIAGNOSIS — R7303 Prediabetes: Secondary | ICD-10-CM | POA: Diagnosis not present

## 2023-07-14 MED ORDER — CLOTRIMAZOLE-BETAMETHASONE 1-0.05 % EX CREA
1.0000 | TOPICAL_CREAM | Freq: Every day | CUTANEOUS | 1 refills | Status: AC
  Filled 2023-07-14: qty 30, 30d supply, fill #0
  Filled 2023-10-31: qty 30, 30d supply, fill #1

## 2023-07-14 MED ORDER — METOPROLOL SUCCINATE ER 25 MG PO TB24
25.0000 mg | ORAL_TABLET | Freq: Every day | ORAL | 3 refills | Status: DC
  Filled 2023-07-14 – 2023-08-02 (×2): qty 90, 90d supply, fill #0
  Filled 2023-10-31: qty 90, 90d supply, fill #1
  Filled 2024-01-14 – 2024-01-22 (×2): qty 90, 90d supply, fill #2
  Filled 2024-04-29: qty 90, 90d supply, fill #3

## 2023-07-14 NOTE — Assessment & Plan Note (Signed)
Blood pressure well-controlled on metoprolol. Discussed issues with prescription refills due to transition periods. Patient prefers 90-day supply to avoid frequent pharmacy visits. - Prescribe metoprolol 90-day supply with three refills.

## 2023-07-14 NOTE — Assessment & Plan Note (Signed)
Last A1c was normal. Discussed previous use of Zepbound for weight loss and its impact on A1c and back pain. Patient discontinued Zepbound due to cost and insufficient weight loss. - Order A1c test.

## 2023-07-14 NOTE — Assessment & Plan Note (Signed)
Patient continues to use CPAP regularly with no issues. - Continue CPAP use.

## 2023-07-14 NOTE — Patient Instructions (Signed)
   The CDC recommends two doses of Shingrix (the shingles vaccine) separated by 2 to 6 months for adults age 53 years and older. I recommend checking with your insurance plan regarding coverage for this vaccine.   

## 2023-07-14 NOTE — Assessment & Plan Note (Signed)
Reviewed last lipid panel Not currently on a statin Recheck FLP and CMP Discussed diet and exercise  

## 2023-07-14 NOTE — Progress Notes (Signed)
Complete physical exam   Patient: Audrey Koch   DOB: July 29, 1971   52 y.o. Female  MRN: [id number] Visit Date: 07/14/2023  Today's healthcare provider: Shirlee Latch, MD   Chief Complaint  Patient presents with   Annual Exam    Last CPE 07/08/22. Patient reports consuming a general well balanced diet. She reports participating in exercise two to three times a week for about thirty to forty five minutes. She reports feeling well and sleeping fairly well. Patient has no concerns to report.    Subjective    Audrey Koch is a 52 y.o. female who presents today for a complete physical exam.     Discussed the use of AI scribe software for clinical note transcription with the patient, who gave verbal consent to proceed.  History of Present Illness   The patient, with a history of prediabetes and high blood pressure, presents for a routine physical examination. She reports no new or worsening symptoms. She is due for a mammogram in a few weeks, a colonoscopy in 2029, and a Pap smear in 2026. She has received her flu shot for the year and is considering a shingles vaccination.  The patient has been managing her blood pressure with metoprolol, which she reports taking regularly. She also uses a CPAP machine daily for sleep apnea, which she finds helpful. She has a history of prediabetes, with her last A1c being normal. However, she expresses concern about potential changes since discontinuing a weight loss medication, Zepbound, due to insurance coverage issues. She reports a weight loss of 15 pounds while on the medication and improvement in back pain, but she found the cost prohibitive without insurance coverage.  The patient also reports a persistent issue with athlete's foot, which has been resistant to over-the-counter treatments. She expresses interest in a stronger prescription treatment.        Last depression screening scores    07/08/2022    3:25 PM 06/18/2022    4:24 PM  12/14/2021    3:09 PM  PHQ 2/9 Scores  PHQ - 2 Score 0 0 1  PHQ- 9 Score 2 1 3    Last fall risk screening    06/18/2022    4:23 PM  Fall Risk   Falls in the past year? 0  Number falls in past yr: 0  Injury with Fall? 0  Risk for fall due to : No Fall Risks  Follow up Falls evaluation completed        Medications: Outpatient Medications Prior to Visit  Medication Sig   acetaminophen (TYLENOL) 500 MG tablet Take 2 tablets (1,000 mg total) by mouth every 6 (six) hours as needed for mild pain.   Blood Pressure Monitoring (OMRON 3 SERIES BP MONITOR) DEVI use to check blood pressure   Cholecalciferol (VITAMIN D3) 250 MCG (10000 UT) capsule 1 capsule   ibuprofen (ADVIL) 400 MG tablet 1 tablet with food or milk as needed   lidocaine (XYLOCAINE) 5 % ointment Apply 1 Application topically 4 (four) times daily as needed for mild pain.   mometasone (NASONEX) 50 MCG/ACT nasal spray Place 2 sprays into the nose daily.   Multiple Vitamins-Minerals (VITAMIN D3 COMPLETE PO) Take by mouth.   ondansetron (ZOFRAN) 4 MG tablet Take 1 tablet (4 mg total) by mouth every 8 (eight) hours as needed for nausea or vomiting.   [DISCONTINUED] metoprolol succinate (TOPROL-XL) 25 MG 24 hr tablet Take 1 tablet (25 mg total) by mouth daily.   [  DISCONTINUED] tirzepatide (ZEPBOUND) 7.5 MG/0.5ML Pen Inject 7.5 mg into the skin once a week. (Patient not taking: Reported on 07/14/2023)   No facility-administered medications prior to visit.    Review of Systems    Objective    BP 119/82 (BP Location: Left Arm, Patient Position: Sitting, Cuff Size: Large)   Pulse 71   Ht 5\' 2"  (1.575 m)   Wt 204 lb 14.4 oz (92.9 kg)   LMP 07/08/2023   SpO2 94%   BMI 37.48 kg/m    Physical Exam Vitals reviewed.  Constitutional:      General: She is not in acute distress.    Appearance: Normal appearance. She is well-developed. She is not diaphoretic.  HENT:     Head: Normocephalic and atraumatic.     Right Ear:  Tympanic membrane, ear canal and external ear normal.     Left Ear: Tympanic membrane, ear canal and external ear normal.     Nose: Nose normal.     Mouth/Throat:     Mouth: Mucous membranes are moist.     Pharynx: Oropharynx is clear. No oropharyngeal exudate.  Eyes:     General: No scleral icterus.    Conjunctiva/sclera: Conjunctivae normal.     Pupils: Pupils are equal, round, and reactive to light.  Neck:     Thyroid: No thyromegaly.  Cardiovascular:     Rate and Rhythm: Normal rate and regular rhythm.     Heart sounds: Normal heart sounds. No murmur heard. Pulmonary:     Effort: Pulmonary effort is normal. No respiratory distress.     Breath sounds: Normal breath sounds. No wheezing or rales.  Abdominal:     General: There is no distension.     Palpations: Abdomen is soft.     Tenderness: There is no abdominal tenderness.  Musculoskeletal:        General: No deformity.     Cervical back: Neck supple.     Right lower leg: No edema.     Left lower leg: No edema.  Lymphadenopathy:     Cervical: No cervical adenopathy.  Skin:    General: Skin is warm and dry.     Findings: No rash.  Neurological:     Mental Status: She is alert and oriented to person, place, and time. Mental status is at baseline.     Gait: Gait normal.  Psychiatric:        Mood and Affect: Mood normal.        Behavior: Behavior normal.        Thought Content: Thought content normal.      No results found for any visits on 07/14/23.  Assessment & Plan    Routine Health Maintenance and Physical Exam  Exercise Activities and Dietary recommendations  Goals   None     Immunization History  Administered Date(s) Administered   DTaP 02/12/1974, 03/11/1988, 10/31/1998   Hepatitis B 05/16/1992, 06/30/1992, 11/17/1992   IPV 05/12/2006   Influenza Whole 05/06/2017   Influenza,inj,Quad PF,6+ Mos 04/28/2022   Influenza-Unspecified 04/08/2018, 04/28/2019, 03/29/2021, 03/31/2023   PFIZER(Purple  Top)SARS-COV-2 Vaccination 07/26/2019, 08/16/2019, 08/24/2019   Td 03/11/1988   Tdap 07/05/2021   Typhoid Live 05/12/2006    Health Maintenance  Topic Date Due   Zoster Vaccines- Shingrix (1 of 2) Never done   COVID-19 Vaccine (4 - 2024-25 season) 03/30/2023   MAMMOGRAM  05/28/2023   Cervical Cancer Screening (HPV/Pap Cotest)  11/07/2024   Colonoscopy  06/04/2028   DTaP/Tdap/Td (5 - Td  or Tdap) 07/06/2031   INFLUENZA VACCINE  Completed   Hepatitis C Screening  Completed   HIV Screening  Completed   HPV VACCINES  Aged Out    Discussed health benefits of physical activity, and encouraged her to engage in regular exercise appropriate for her age and condition.  Problem List Items Addressed This Visit       Cardiovascular and Mediastinum   Essential hypertension   Blood pressure well-controlled on metoprolol. Discussed issues with prescription refills due to transition periods. Patient prefers 90-day supply to avoid frequent pharmacy visits. - Prescribe metoprolol 90-day supply with three refills.      Relevant Medications   metoprolol succinate (TOPROL-XL) 25 MG 24 hr tablet   Other Relevant Orders   Comprehensive metabolic panel     Respiratory   OSA on CPAP   Patient continues to use CPAP regularly with no issues. - Continue CPAP use.        Other   Prediabetes   Last A1c was normal. Discussed previous use of Zepbound for weight loss and its impact on A1c and back pain. Patient discontinued Zepbound due to cost and insufficient weight loss. - Order A1c test.      Relevant Orders   Hemoglobin A1c   Moderate mixed hyperlipidemia not requiring statin therapy   Reviewed last lipid panel Not currently on a statin Recheck FLP and CMP Discussed diet and exercise       Relevant Medications   metoprolol succinate (TOPROL-XL) 25 MG 24 hr tablet   Other Relevant Orders   Comprehensive metabolic panel   Lipid panel   Other Visit Diagnoses       Encounter for  annual physical exam    -  Primary   Relevant Orders   Comprehensive metabolic panel   Lipid panel   Hemoglobin A1c     Perimenopausal         Tinea pedis of both feet       Relevant Medications   clotrimazole-betamethasone (LOTRISONE) cream           Perimenopause Irregular periods and spotting, likely due to perimenopause. Discussed that a full year without a period is required to diagnose menopause. - Monitor menstrual cycle.  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 - Prescribe stronger prescription topical antifungal with steroid.  General Health Maintenance Routine physical examination. Discussed the importance of staying up to date with screenings and vaccinations. Discussed shingles vaccine, including risks (pain and swelling at injection site, flu-like symptoms after second shot), benefits (95% effectiveness), and alternatives (no vaccine). - Continue with scheduled mammogram in two weeks. - Next colonoscopy due in 2029. - Next Pap smear due in 2026. - Administer flu shot annually. - Consider shingles vaccine; discussed benefits and side effects.  Follow-up - Order lipid panel, CMP, and A1c. - Schedule follow-up appointment for next year. - Send lab slip for fasting blood work.        Return in about 1 year (around 07/13/2024) for CPE.     Shirlee Latch, MD  [facility] [phone] (phone) [phone] (fax)  [facility]

## 2023-07-16 DIAGNOSIS — E782 Mixed hyperlipidemia: Secondary | ICD-10-CM | POA: Diagnosis not present

## 2023-07-16 DIAGNOSIS — I10 Essential (primary) hypertension: Secondary | ICD-10-CM | POA: Diagnosis not present

## 2023-07-16 DIAGNOSIS — R7303 Prediabetes: Secondary | ICD-10-CM | POA: Diagnosis not present

## 2023-07-16 DIAGNOSIS — Z0000 Encounter for general adult medical examination without abnormal findings: Secondary | ICD-10-CM | POA: Diagnosis not present

## 2023-07-17 LAB — COMPREHENSIVE METABOLIC PANEL
ALT: 19 [IU]/L (ref 0–32)
AST: 15 [IU]/L (ref 0–40)
Albumin: 4.4 g/dL (ref 3.8–4.9)
Alkaline Phosphatase: 97 [IU]/L (ref 44–121)
BUN/Creatinine Ratio: 14 (ref 9–23)
BUN: 13 mg/dL (ref 6–24)
Bilirubin Total: 0.4 mg/dL (ref 0.0–1.2)
CO2: 19 mmol/L — ABNORMAL LOW (ref 20–29)
Calcium: 9.2 mg/dL (ref 8.7–10.2)
Chloride: 104 mmol/L (ref 96–106)
Creatinine, Ser: 0.93 mg/dL (ref 0.57–1.00)
Globulin, Total: 2.2 g/dL (ref 1.5–4.5)
Glucose: 93 mg/dL (ref 70–99)
Potassium: 4.4 mmol/L (ref 3.5–5.2)
Sodium: 138 mmol/L (ref 134–144)
Total Protein: 6.6 g/dL (ref 6.0–8.5)
eGFR: 73 mL/min/{1.73_m2} (ref >59)

## 2023-07-17 LAB — LIPID PANEL
Chol/HDL Ratio: 4.8 {ratio} — ABNORMAL HIGH (ref 0.0–4.4)
Cholesterol, Total: 221 mg/dL — ABNORMAL HIGH (ref 100–199)
HDL: 46 mg/dL (ref >39)
LDL Chol Calc (NIH): 159 mg/dL — ABNORMAL HIGH (ref 0–99)
Triglycerides: 88 mg/dL (ref 0–149)
VLDL Cholesterol Cal: 16 mg/dL (ref 5–40)

## 2023-07-17 LAB — HEMOGLOBIN A1C
Est. average glucose Bld gHb Est-mCnc: 120 mg/dL
Hgb A1c MFr Bld: 5.8 % — ABNORMAL HIGH (ref 4.8–5.6)

## 2023-07-29 ENCOUNTER — Ambulatory Visit
Admission: RE | Admit: 2023-07-29 | Discharge: 2023-07-29 | Disposition: A | Discharge status: Home / Self Care | Payer: 59 | Source: Ambulatory Visit | Attending: Family Medicine | Admitting: Family Medicine

## 2023-07-29 DIAGNOSIS — Z1231 Encounter for screening mammogram for malignant neoplasm of breast: Secondary | ICD-10-CM | POA: Diagnosis not present

## 2023-08-02 ENCOUNTER — Other Ambulatory Visit: Payer: Self-pay

## 2023-08-02 DIAGNOSIS — G4733 Obstructive sleep apnea (adult) (pediatric): Secondary | ICD-10-CM | POA: Diagnosis not present

## 2023-08-03 ENCOUNTER — Other Ambulatory Visit: Payer: Self-pay

## 2023-09-23 NOTE — Progress Notes (Unsigned)
 PATIENT: Audrey Koch DOB: 1970-08-12  REASON FOR VISIT: follow up HISTORY FROM: patient  No chief complaint on file.    HISTORY OF PRESENT ILLNESS:  09/23/23 ALL:  Audrey Koch returns for follow up for OSA on CPAP. She continues to do well on therapy. She is using CPAP nightly for about   09/24/2022 ALL:  Audrey Koch RINDY KOLLMAN is a 53 y.o. female here today for follow up for OSA on CPAP.  She continues to do well on therapy. She does note benefit of using therapy. She is using therapy every night for about 7 hours. She denies concerns with machine or supplies. She continues to work on [employer]. She continues Zepbound injections. She is followed closely by PCP.     HISTORY: (copied from Dr Dohmeier's previous note)  Audrey Koch is a 53 y.o. Caucasian female patient and seen on 09-24-2021; following after HST and CPAP initiation.  I had the pleasure of meeting today again with a meat loaded she underwent a home sleep test following her initial consultation on 05-08-2021.  The patient had endorsed the Epworth sleepiness score at 11 points the calculated AHI or apnea hypopnea index was 14.1/h but during REM sleep more than doubled to 29.7/h non-REM AHI was 9.9/h.  In supine sleep there was also a critical accentuation to 15.7/h.  Snoring was unusually loud the mean volume was measured at 52 dB.  And over 15.6% of the total sleep time there was even a volume over 60 dB. There was no significant hypoxia heart rate varied between 53 and 105 bpm and due to the REM dependent apnea and the elevated BMI we decided to recommend a CPAP titration.  The patient was set up for an autotitrator between 6 and 16 cmH2O pressure window 3 cm expiratory pressure relief with heated humidification for tubing and machine or mask to be fitted.  I have not the pleasure of seeing the compliance report and this patient has been 100% compliant she has used the machine every day of the last 30 days and each of those days over 4  hours with an average of 7 hours 50 minutes minimum pressure is as ordered 6 cmH2O maximum pressure is 16 cmH2O her residual AHI is 0.2/h this is an excellent resolution of apnea the 95th percentile pressure was 9.3 cmH2O so well within the current settings.  The settings have not to change neither does the mask which does not allow for a lot of air leakage.  But what has been remarkable is a reduction in the Epworth Sleepiness Scale to 4 points when the pre-CPAP score was 11 points.  So there is a significant reduction in sleepiness and and fatigue.  She is going to the bathroom 1-2 times now, not 3-4 times.   REVIEW OF SYSTEMS: Out of a complete 14 system review of symptoms, the patient complains only of the following symptoms, none and all other reviewed systems are negative.  ESS: 3/24  ALLERGIES: Allergies  Allergen Reactions   Phentermine Hcl Other (See Comments)    Elevated B/P  Other Reaction(s): rapid heart rate   Spironolactone Rash   Sulfa Antibiotics Rash    HOME MEDICATIONS: Outpatient Medications Prior to Visit  Medication Sig Dispense Refill   acetaminophen (TYLENOL) 500 MG tablet Take 2 tablets (1,000 mg total) by mouth every 6 (six) hours as needed for mild pain.     Blood Pressure Monitoring (OMRON 3 SERIES BP MONITOR) DEVI use to check  blood pressure 1 each 0   Cholecalciferol (VITAMIN D3) 250 MCG (10000 UT) capsule 1 capsule     clotrimazole-betamethasone (LOTRISONE) cream Apply 1 Application topically daily. 30 g 1   ibuprofen (ADVIL) 400 MG tablet 1 tablet with food or milk as needed     lidocaine (XYLOCAINE) 5 % ointment Apply 1 Application topically 4 (four) times daily as needed for mild pain. 35.44 g 0   metoprolol succinate (TOPROL-XL) 25 MG 24 hr tablet Take 1 tablet (25 mg total) by mouth daily. 90 tablet 3   mometasone (NASONEX) 50 MCG/ACT nasal spray Place 2 sprays into the nose daily. 1 each 12   Multiple Vitamins-Minerals (VITAMIN D3 COMPLETE PO) Take by  mouth.     ondansetron (ZOFRAN) 4 MG tablet Take 1 tablet (4 mg total) by mouth every 8 (eight) hours as needed for nausea or vomiting. 20 tablet 0   No facility-administered medications prior to visit.    PAST MEDICAL HISTORY: Past Medical History:  Diagnosis Date   Anxiety    History of kidney stones    Hyperlipidemia    Hypertension    Migraine    Pre-diabetes    Sleep apnea     PAST SURGICAL HISTORY: Past Surgical History:  Procedure Laterality Date   COLONOSCOPY WITH PROPOFOL N/A 06/04/2021   Procedure: COLONOSCOPY WITH PROPOFOL;  Surgeon: Toney Reil, MD;  Location: [facility];  Service: Gastroenterology;  Laterality: N/A;   EVALUATION UNDER ANESTHESIA WITH HEMORRHOIDECTOMY N/A 01/16/2022   Procedure: EXAM UNDER ANESTHESIA WITH HEMORRHOIDECTOMY of 2 + columns;  Surgeon: Henrene Dodge, MD;  Location: [facility] ORS;  Service: General;  Laterality: N/A;   FINGER FRACTURE SURGERY Right 2016   ring finger   HEMORROIDECTOMY  10/08/2013    FAMILY HISTORY: Family History  Problem Relation Age of Onset   Breast cancer Mother    Heart attack Mother    Hyperlipidemia Mother    Dementia Maternal Grandmother    Diabetes Maternal Grandfather    Dementia Paternal Grandmother    Diabetes Paternal Grandfather    Esophageal cancer Paternal Grandfather    Diabetes Brother     SOCIAL HISTORY: Social History   Socioeconomic History   Marital status: Married    Spouse name: Audrey Koch   Number of children: 2   Years of education: Not on file   Highest education level: Master's degree (e.g., MA, MS, MEng, MEd, MSW, MBA)  Occupational History   Not on file  Tobacco Use   Smoking status: Never   Smokeless tobacco: Never  Vaping Use   Vaping status: Never Used  Substance and Sexual Activity   Alcohol use: No   Drug use: No   Sexual activity: Not on file  Other Topics Concern   Not on file  Social History Narrative   Live with husband and daughter Lauren   Right handed    Caffeine: 12oz of caffeine a day   Social Drivers of Corporate investment banker Strain: Not on file  Food Insecurity: Not on file  Transportation Needs: Not on file  Physical Activity: Not on file  Stress: Not on file  Social Connections: Not on file  Intimate Partner Violence: Not on file     PHYSICAL EXAM  There were no vitals filed for this visit.  There is no height or weight on file to calculate BMI.  Generalized: Well developed, in no acute distress  Cardiology: normal rate and rhythm, no murmur noted Respiratory: clear to auscultation  bilaterally  Neurological examination  Mentation: Alert oriented to time, place, history taking. Follows all commands speech and language fluent Cranial nerve II-XII: Pupils were equal round reactive to light. Extraocular movements were full, visual field were full  Motor: The motor testing reveals 5 over 5 strength of all 4 extremities. Good symmetric motor tone is noted throughout.  Gait and station: Gait is normal.    DIAGNOSTIC DATA (LABS, IMAGING, TESTING) - I reviewed patient records, labs, notes, testing and imaging myself where available.      No data to display           Lab Results  Component Value Date   WBC 7.4 01/11/2022   HGB 14.0 01/11/2022   HCT 43.7 01/11/2022   MCV 87.4 01/11/2022   PLT 333 01/11/2022      Component Value Date/Time   NA 138 07/16/2023 0907   NA 142 12/10/2011 0922   K 4.4 07/16/2023 0907   K 3.7 12/10/2011 0922   CL 104 07/16/2023 0907   CL 108 (H) 12/10/2011 0922   CO2 19 (L) 07/16/2023 0907   CO2 26 12/10/2011 0922   GLUCOSE 93 07/16/2023 0907   GLUCOSE 93 01/11/2022 0928   GLUCOSE 80 12/10/2011 0922   BUN 13 07/16/2023 0907   BUN 15 12/10/2011 0922   CREATININE 0.93 07/16/2023 0907   CREATININE 0.75 12/10/2011 0922   CALCIUM 9.2 07/16/2023 0907   CALCIUM 8.2 (L) 12/10/2011 0922   PROT 6.6 07/16/2023 0907   PROT 7.0 12/10/2011 0922   ALBUMIN 4.4 07/16/2023 0907    ALBUMIN 3.8 12/10/2011 0922   AST 15 07/16/2023 0907   AST 14 (L) 12/10/2011 0922   ALT 19 07/16/2023 0907   ALT 20 12/10/2011 0922   ALKPHOS 97 07/16/2023 0907   ALKPHOS 67 12/10/2011 0922   BILITOT 0.4 07/16/2023 0907   BILITOT 0.6 12/10/2011 0922   GFRNONAA >60 01/11/2022 0928   GFRNONAA >60 12/10/2011 0922   GFRAA 82 11/10/2019 0807   GFRAA >60 12/10/2011 0922   Lab Results  Component Value Date   CHOL 221 (H) 07/16/2023   HDL 46 07/16/2023   LDLCALC 159 (H) 07/16/2023   TRIG 88 07/16/2023   CHOLHDL 4.8 (H) 07/16/2023   Lab Results  Component Value Date   HGBA1C 5.8 (H) 07/16/2023   Lab Results  Component Value Date   VITAMINB12 384 07/19/2016   Lab Results  Component Value Date   TSH 3.400 11/10/2019     ASSESSMENT AND PLAN 54 y.o. year old female  has a past medical history of Anxiety, History of kidney stones, Hyperlipidemia, Hypertension, Migraine, Pre-diabetes, and Sleep apnea. here with   No diagnosis found.    Donnavin Vandenbrink T Ambroise is doing well on CPAP therapy. Compliance report reveals excellent compliance. She was encouraged to continue using CPAP nightly and for greater than 4 hours each night. We will update supply orders as indicated. Risks of untreated sleep apnea review and education materials provided. Healthy lifestyle habits encouraged. She will follow up in 1 year, sooner if needed. She verbalizes understanding and agreement with this plan.    No orders of the defined types were placed in this encounter.    No orders of the defined types were placed in this encounter.     Zanya Lindo, FNP-C 09/23/2023, 4:08 PM [facility] [address], [address] [place], [place] [address] [phone]

## 2023-09-23 NOTE — Patient Instructions (Signed)

## 2023-09-25 ENCOUNTER — Encounter: Payer: Self-pay | Admitting: Family Medicine

## 2023-09-25 ENCOUNTER — Ambulatory Visit: Payer: 59 | Admitting: Family Medicine

## 2023-09-25 VITALS — BP 138/82 | HR 76 | Ht 62.0 in | Wt 200.0 lb

## 2023-09-25 DIAGNOSIS — G4733 Obstructive sleep apnea (adult) (pediatric): Secondary | ICD-10-CM | POA: Diagnosis not present

## 2023-12-16 ENCOUNTER — Encounter (INDEPENDENT_AMBULATORY_CARE_PROVIDER_SITE_OTHER): Payer: Self-pay

## 2024-01-07 DIAGNOSIS — G4733 Obstructive sleep apnea (adult) (pediatric): Secondary | ICD-10-CM | POA: Diagnosis not present

## 2024-01-14 ENCOUNTER — Other Ambulatory Visit: Payer: Self-pay

## 2024-01-22 ENCOUNTER — Other Ambulatory Visit: Payer: Self-pay

## 2024-01-26 ENCOUNTER — Encounter: Payer: Self-pay | Admitting: Plastic Surgery

## 2024-01-26 ENCOUNTER — Ambulatory Visit (INDEPENDENT_AMBULATORY_CARE_PROVIDER_SITE_OTHER): Admitting: Plastic Surgery

## 2024-01-26 VITALS — BP 133/85 | HR 78 | Ht 62.0 in | Wt 199.2 lb

## 2024-01-26 DIAGNOSIS — M545 Low back pain, unspecified: Secondary | ICD-10-CM

## 2024-01-26 DIAGNOSIS — M546 Pain in thoracic spine: Secondary | ICD-10-CM | POA: Diagnosis not present

## 2024-01-26 DIAGNOSIS — G8929 Other chronic pain: Secondary | ICD-10-CM

## 2024-01-26 DIAGNOSIS — N62 Hypertrophy of breast: Secondary | ICD-10-CM | POA: Diagnosis not present

## 2024-01-26 DIAGNOSIS — M542 Cervicalgia: Secondary | ICD-10-CM | POA: Diagnosis not present

## 2024-01-26 DIAGNOSIS — M549 Dorsalgia, unspecified: Secondary | ICD-10-CM | POA: Insufficient documentation

## 2024-01-26 NOTE — Progress Notes (Signed)
 Patient ID: Audrey Koch, female    DOB: March 11, 1971, 53 y.o.   MRN: [id number]   Chief Complaint  Patient presents with   Consult         Mammary Hyperplasia: The patient is a 53 y.o. female with a history of mammary hyperplasia for several years.  She has extremely large breasts causing symptoms that include the following: Back pain in the upper and lower back, including neck pain. She pulls or pins her bra straps to provide better lift and relief of the pressure and pain. She notices relief by holding her breast up manually.  Her shoulder straps cause grooves and pain and pressure that requires padding for relief. Pain medication is sometimes required with motrin  and tylenol .  Activities that are hindered by enlarged breasts include: exercise and running.  She has tried supportive clothing as well as fitted bras without improvement.  Her breasts are extremely large and fairly symmetric.  She has hyperpigmentation of the inframammary area on both sides.  The sternal to nipple distance on the right is 36 cm and the left is 37 cm.  The IMF distance is 17 cm.  She is 5 feet 2 inches tall and weighs 199 pounds.  The BMI = 36.4 kg/m.  Preoperative bra size = G cup.  The estimated excess breast tissue to be removed at the time of surgery = 500-550 grams on the left and 500-550 grams on the right.  Mammogram history: 06/2023 negative.  Family history of breast cancer:  mother.  Tobacco use:  none.   The patient expresses the desire to pursue surgical intervention.  She has had many years of chiropractic treatment but not physical therapy.  This was for neck and back pain.     Review of Systems  Constitutional:  Positive for activity change. Negative for appetite change.  HENT: Negative.    Eyes: Negative.   Respiratory: Negative.    Cardiovascular: Negative.   Gastrointestinal: Negative.   Endocrine: Negative.   Genitourinary: Negative.   Musculoskeletal:  Positive for back pain and neck  pain.  Hematological: Negative.     Past Medical History:  Diagnosis Date   Anxiety    History of kidney stones    Hyperlipidemia    Hypertension    Migraine    Pre-diabetes    Sleep apnea     Past Surgical History:  Procedure Laterality Date   COLONOSCOPY WITH PROPOFOL  N/A 06/04/2021   Procedure: COLONOSCOPY WITH PROPOFOL ;  Surgeon: Unk Corinn Skiff, MD;  Location: [facility] ENDOSCOPY;  Service: Gastroenterology;  Laterality: N/A;   EVALUATION UNDER ANESTHESIA WITH HEMORRHOIDECTOMY N/A 01/16/2022   Procedure: EXAM UNDER ANESTHESIA WITH HEMORRHOIDECTOMY of 2 + columns;  Surgeon: Desiderio Schanz, MD;  Location: [facility] ORS;  Service: General;  Laterality: N/A;   FINGER FRACTURE SURGERY Right 2016   ring finger   HEMORROIDECTOMY  10/08/2013      Current Outpatient Medications:    acetaminophen  (TYLENOL ) 500 MG tablet, Take 2 tablets (1,000 mg total) by mouth every 6 (six) hours as needed for mild pain., Disp: , Rfl:    Blood Pressure Monitoring (OMRON 3 SERIES BP MONITOR) DEVI, use to check blood pressure, Disp: 1 each, Rfl: 0   Cholecalciferol (VITAMIN D3) 250 MCG (10000 UT) capsule, 1 capsule, Disp: , Rfl:    clotrimazole -betamethasone  (LOTRISONE ) cream, Apply 1 Application topically daily., Disp: 30 g, Rfl: 1   ibuprofen  (ADVIL ) 400 MG tablet, 1 tablet with food or milk as  needed, Disp: , Rfl:    metoprolol  succinate (TOPROL -XL) 25 MG 24 hr tablet, Take 1 tablet (25 mg total) by mouth daily., Disp: 90 tablet, Rfl: 3   Multiple Vitamins-Minerals (VITAMIN D3 COMPLETE PO), Take by mouth., Disp: , Rfl:    ondansetron  (ZOFRAN ) 4 MG tablet, Take 1 tablet (4 mg total) by mouth every 8 (eight) hours as needed for nausea or vomiting. (Patient taking differently: Take 4 mg by mouth as needed for nausea or vomiting.), Disp: 20 tablet, Rfl: 0   Objective:   Vitals:   01/26/24 1156  BP: 133/85  Pulse: 78  SpO2: 94%    Physical Exam Vitals reviewed.  Constitutional:      Appearance: Normal  appearance.   Cardiovascular:     Rate and Rhythm: Normal rate.     Pulses: Normal pulses.  Pulmonary:     Effort: Pulmonary effort is normal.  Abdominal:     General: There is no distension.     Palpations: Abdomen is soft.   Musculoskeletal:        General: No swelling or deformity.   Skin:    General: Skin is warm.     Capillary Refill: Capillary refill takes less than 2 seconds.     Coloration: Skin is not jaundiced.     Findings: No bruising.   Neurological:     Mental Status: She is alert and oriented to person, place, and time.   Psychiatric:        Mood and Affect: Mood normal.        Behavior: Behavior normal.        Thought Content: Thought content normal.        Judgment: Judgment normal.     Assessment & Plan:  Hypertrophy of breast - Plan: Ambulatory referral to Physical Therapy  Chronic bilateral thoracic back pain  The procedure the patient selected and that was best for the patient was discussed. The risk were discussed and include but not limited to the following:  Breast asymmetry, fluid accumulation, firmness of the breast, inability to breast feed, loss of nipple or areola, skin loss, change in skin and nipple sensation, fat necrosis of the breast tissue, bleeding, infection and healing delay.  There are risks of anesthesia and injury to nerves or blood vessels.  Allergic reaction to tape, suture and skin glue are possible.  There will be swelling.  Any of these can lead to the need for revisional surgery which is not included in this surgery.  A breast reduction has potential to interfere with diagnostic procedures in the future.  This procedure is best done when the breast is fully developed.  Changes in the breast will continue to occur over time: pregnancy, weight gain or weigh loss. No guarantees are given for a certain bra or breast size.    Total time: 40 minutes. This includes time spent with the patient during the visit as well as time spent before  and after the visit reviewing the chart, documenting the encounter, ordering pertinent studies and literature for the patient.   Physical therapy:  ordered Mammogram: Done Healthy Weight and Wellness: Has already been to healthy weight wellness and will let us  know if she wants to go back. The patient is a candidate for bilateral breast reduction.  She is can go through physical therapy and then come back and see us  and let us  know when she wants to submit to insurance.  She is thinking maybe to have the surgery  at the beginning of 2025.   Pictures were obtained of the patient and placed in the chart with the patient's or guardian's permission.   Estefana RAMAN Jaria Conway, DO

## 2024-02-03 NOTE — Addendum Note (Signed)
 Addended by: RONNI RANDINE KIDD on: 02/03/2024 04:27 PM   Modules accepted: Orders

## 2024-02-04 ENCOUNTER — Telehealth: Payer: Self-pay | Admitting: *Deleted

## 2024-02-04 NOTE — Telephone Encounter (Signed)
 LVM to discuss breast reduction requirements prior to patient completing PT.   Plan requires 915g of tissue be removed per breast for BSA 1.99  Current note from Dr. Lowery indicates plan for 500-550g.   Message sent to Dr. JONETTA to see if more can be removed and VM left for patient to discuss this as well. No need to complete PT if we cannot meet the tissue requirement.

## 2024-02-06 DIAGNOSIS — D2262 Melanocytic nevi of left upper limb, including shoulder: Secondary | ICD-10-CM | POA: Diagnosis not present

## 2024-02-06 DIAGNOSIS — L821 Other seborrheic keratosis: Secondary | ICD-10-CM | POA: Diagnosis not present

## 2024-02-06 DIAGNOSIS — L820 Inflamed seborrheic keratosis: Secondary | ICD-10-CM | POA: Diagnosis not present

## 2024-02-06 DIAGNOSIS — D2271 Melanocytic nevi of right lower limb, including hip: Secondary | ICD-10-CM | POA: Diagnosis not present

## 2024-02-06 DIAGNOSIS — L538 Other specified erythematous conditions: Secondary | ICD-10-CM | POA: Diagnosis not present

## 2024-02-06 DIAGNOSIS — D2261 Melanocytic nevi of right upper limb, including shoulder: Secondary | ICD-10-CM | POA: Diagnosis not present

## 2024-02-06 DIAGNOSIS — D2272 Melanocytic nevi of left lower limb, including hip: Secondary | ICD-10-CM | POA: Diagnosis not present

## 2024-02-06 DIAGNOSIS — R208 Other disturbances of skin sensation: Secondary | ICD-10-CM | POA: Diagnosis not present

## 2024-02-06 DIAGNOSIS — D225 Melanocytic nevi of trunk: Secondary | ICD-10-CM | POA: Diagnosis not present

## 2024-04-23 ENCOUNTER — Institutional Professional Consult (permissible substitution): Admitting: Plastic Surgery

## 2024-05-11 DIAGNOSIS — G4733 Obstructive sleep apnea (adult) (pediatric): Secondary | ICD-10-CM | POA: Diagnosis not present

## 2024-07-15 ENCOUNTER — Ambulatory Visit: Admitting: Physical Therapy

## 2024-07-19 ENCOUNTER — Encounter: Payer: Self-pay | Admitting: Family Medicine

## 2024-08-02 ENCOUNTER — Encounter: Admitting: Family Medicine

## 2024-08-02 ENCOUNTER — Other Ambulatory Visit: Payer: Self-pay

## 2024-08-02 ENCOUNTER — Other Ambulatory Visit (HOSPITAL_COMMUNITY)
Admission: RE | Admit: 2024-08-02 | Discharge: 2024-08-02 | Disposition: A | Discharge status: Home / Self Care | Source: Ambulatory Visit | Attending: Family Medicine | Admitting: Family Medicine

## 2024-08-02 ENCOUNTER — Encounter: Payer: Self-pay | Admitting: Family Medicine

## 2024-08-02 ENCOUNTER — Encounter: Payer: Self-pay | Admitting: Pharmacist

## 2024-08-02 VITALS — BP 135/78 | HR 84 | Resp 14 | Ht 62.0 in | Wt 199.5 lb

## 2024-08-02 DIAGNOSIS — R7303 Prediabetes: Secondary | ICD-10-CM | POA: Diagnosis not present

## 2024-08-02 DIAGNOSIS — E281 Androgen excess: Secondary | ICD-10-CM | POA: Diagnosis not present

## 2024-08-02 DIAGNOSIS — Z1231 Encounter for screening mammogram for malignant neoplasm of breast: Secondary | ICD-10-CM

## 2024-08-02 DIAGNOSIS — I10 Essential (primary) hypertension: Secondary | ICD-10-CM

## 2024-08-02 DIAGNOSIS — Z0000 Encounter for general adult medical examination without abnormal findings: Secondary | ICD-10-CM

## 2024-08-02 DIAGNOSIS — N951 Menopausal and female climacteric states: Secondary | ICD-10-CM | POA: Diagnosis not present

## 2024-08-02 DIAGNOSIS — E669 Obesity, unspecified: Secondary | ICD-10-CM | POA: Diagnosis not present

## 2024-08-02 DIAGNOSIS — Z124 Encounter for screening for malignant neoplasm of cervix: Secondary | ICD-10-CM | POA: Insufficient documentation

## 2024-08-02 DIAGNOSIS — F321 Major depressive disorder, single episode, moderate: Secondary | ICD-10-CM | POA: Diagnosis not present

## 2024-08-02 DIAGNOSIS — E782 Mixed hyperlipidemia: Secondary | ICD-10-CM

## 2024-08-02 DIAGNOSIS — E559 Vitamin D deficiency, unspecified: Secondary | ICD-10-CM | POA: Diagnosis not present

## 2024-08-02 MED ORDER — METFORMIN HCL ER 500 MG PO TB24
500.0000 mg | ORAL_TABLET | Freq: Every day | ORAL | 1 refills | Status: AC
  Filled 2024-08-02 – 2024-08-03 (×2): qty 30, 30d supply, fill #0

## 2024-08-02 MED ORDER — METOPROLOL SUCCINATE ER 25 MG PO TB24
25.0000 mg | ORAL_TABLET | Freq: Every day | ORAL | 3 refills | Status: AC
  Filled 2024-08-02 – 2024-08-03 (×2): qty 90, 90d supply, fill #0

## 2024-08-02 NOTE — Assessment & Plan Note (Signed)
 Potential insulin  resistance contributing to weight gain. Discussed metformin  for insulin  resistance and weight loss. - Prescribed extended-release metformin 

## 2024-08-02 NOTE — Progress Notes (Signed)
 "    Complete physical exam   Patient: Audrey Koch   DOB: Oct 31, 1970   54 y.o. Female  MRN: [id number] Visit Date: 08/02/2024  Today's healthcare provider: Jon Eva, MD   Chief Complaint  Patient presents with   Annual Exam    Last physical: 07/14/23   Subjective    Audrey Koch is a 54 y.o. female who presents today for a complete physical exam.   Discussed the use of AI scribe software for clinical note transcription with the patient, who gave verbal consent to proceed.  History of Present Illness   Audrey Koch is a 54 year old female who presents for an annual physical exam and concerns about weight management and menopause symptoms.  She reports difficulty losing weight despite exercising five to six mornings per week and following a healthy diet. Her weight has been stable within about five pounds over the past year. She has tried GLP-1 medications in the past and is concerned her current beta blocker, metoprolol , may be limiting further weight loss.  She has high androgen levels with androgenic symptoms including increased hair growth. Prior endocrine evaluation did not yield a clear diagnosis. She previously used spironolactone but stopped it due to a rash.  She is perimenopausal and has had no menstrual period for eleven months. She has frequent hot flashes, mainly at night, with early morning awakenings around 2:45 AM and difficulty returning to sleep, which affects her daytime functioning.  She has hypertension. Triamterene  HCTZ did not control her blood pressure, and she is now on metoprolol , which she questions because of its possible effect on her weight.  She is considering breast reduction surgery for chronic back and neck pain and has started physical therapy.  She is due for a mammogram and Pap smear, with Pap performed today. She is also due for routine labs including lipids, kidney and liver function, A1c, and vitamin D , given past low vitamin D .        Last depression screening scores    08/02/2024    3:38 PM 07/08/2022    3:25 PM 06/18/2022    4:24 PM  PHQ 2/9 Scores  PHQ - 2 Score 0 0 0  PHQ- 9 Score  2  1      Data saved with a previous flowsheet row definition   Last fall risk screening    08/02/2024    3:38 PM  Fall Risk   Falls in the past year? 0  Number falls in past yr: 0  Injury with Fall? 0  Risk for fall due to : No Fall Risks  Follow up Falls evaluation completed        Medications: Show/hide medication list[1]  Review of Systems    Objective    BP 135/78   Pulse 84   Resp 14   Ht 5' 2 (1.575 m)   Wt 199 lb 8 oz (90.5 kg)   LMP 07/08/2023   SpO2 99%   BMI 36.49 kg/m    Physical Exam Vitals reviewed.  Constitutional:      General: She is not in acute distress.    Appearance: Normal appearance. She is well-developed. She is not diaphoretic.  HENT:     Head: Normocephalic and atraumatic.     Right Ear: Tympanic membrane, ear canal and external ear normal.     Left Ear: Tympanic membrane, ear canal and external ear normal.     Nose: Nose normal.     Mouth/Throat:  Mouth: Mucous membranes are moist.     Pharynx: Oropharynx is clear. No oropharyngeal exudate.  Eyes:     General: No scleral icterus.    Conjunctiva/sclera: Conjunctivae normal.     Pupils: Pupils are equal, round, and reactive to light.  Neck:     Thyroid : No thyromegaly.  Cardiovascular:     Rate and Rhythm: Normal rate and regular rhythm.     Heart sounds: Normal heart sounds. No murmur heard. Pulmonary:     Effort: Pulmonary effort is normal. No respiratory distress.     Breath sounds: Normal breath sounds. No wheezing or rales.  Abdominal:     General: There is no distension.     Palpations: Abdomen is soft.     Tenderness: There is no abdominal tenderness.  Genitourinary:    Comments: GYN:  External genitalia within normal limits.  Vaginal mucosa pink, moist, normal rugae.  Nonfriable cervix without  lesions, thin white discharge, no bleeding noted on speculum exam.    Chaperone present Musculoskeletal:        General: No deformity.     Cervical back: Neck supple.     Right lower leg: No edema.     Left lower leg: No edema.  Lymphadenopathy:     Cervical: No cervical adenopathy.  Skin:    General: Skin is warm and dry.     Findings: No rash.  Neurological:     Mental Status: She is alert and oriented to person, place, and time. Mental status is at baseline.     Gait: Gait normal.  Psychiatric:        Mood and Affect: Mood normal.        Behavior: Behavior normal.        Thought Content: Thought content normal.      No results found for any visits on 08/02/24.  Assessment & Plan    Routine Health Maintenance and Physical Exam  Exercise Activities and Dietary recommendations  Goals   None     Immunization History  Administered Date(s) Administered   DTaP 02/12/1974, 03/11/1988, 10/31/1998   Dtap, Unspecified 02/12/1974, 03/11/1988, 10/31/1998   Hepatitis B 05/16/1992, 06/30/1992, 11/17/1992   IPV 05/12/2006   Influenza Whole 05/06/2017   Influenza,inj,Quad PF,6+ Mos 04/28/2022   Influenza-Unspecified 04/08/2018, 04/28/2019, 03/29/2021, 03/31/2023, 04/12/2024   PFIZER(Purple Top)SARS-COV-2 Vaccination 07/26/2019, 08/16/2019, 08/24/2019   Td 03/11/1988   Tdap 07/05/2021   Typhoid Live 05/12/2006    Health Maintenance  Topic Date Due   COVID-19 Vaccine (4 - 2025-26 season) 03/29/2024   Mammogram  07/28/2024   Zoster Vaccines- Shingrix (1 of 2) 10/31/2024 (Originally 09/03/2019)   Pneumococcal Vaccine: 50+ Years (1 of 1 - PCV) 08/02/2025 (Originally 09/03/2019)   Cervical Cancer Screening (HPV/Pap Cotest)  11/07/2024   Colonoscopy  06/04/2028   DTaP/Tdap/Td (6 - Td or Tdap) 07/06/2031   Influenza Vaccine  Completed   Hepatitis B Vaccines 19-59 Average Risk  Completed   Hepatitis C Screening  Completed   HIV Screening  Completed   HPV VACCINES  Aged Out    Meningococcal B Vaccine  Aged Out    Discussed health benefits of physical activity, and encouraged her to engage in regular exercise appropriate for her age and condition.  Problem List Items Addressed This Visit       Cardiovascular and Mediastinum   Essential hypertension   Currently managed with metoprolol . Potential impact of beta blockers on weight gain. Discussed alternative antihypertensive options if needed. - Continue metoprolol  -  Will consider alternative antihypertensive options if needed      Relevant Medications   metoprolol  succinate (TOPROL -XL) 25 MG 24 hr tablet     Other   Avitaminosis D   Previous low vitamin D  levels. No current supplementation. Discussed importance of monitoring vitamin D  levels. - Ordered vitamin D  level       Relevant Orders   VITAMIN D  25 Hydroxy (Vit-D Deficiency, Fractures)   Depression, major, single episode, moderate (HCC)   Well controlled      Obesity (BMI 30-39.9)   Weight maintenance despite diet and exercise. Potential impact of hormonal changes and insulin  resistance on weight. Discussed metformin  for insulin  resistance and weight loss. Consideration of hormone replacement therapy post-menopause. Discussed potential benefits of breast reduction surgery for back and neck pain. - Prescribed extended-release metformin  - Will consider hormone replacement therapy post-menopause - Will discuss potential breast reduction surgery      Relevant Medications   metFORMIN  (GLUCOPHAGE -XR) 500 MG 24 hr tablet   Prediabetes   Potential insulin  resistance contributing to weight gain. Discussed metformin  for insulin  resistance and weight loss. - Prescribed extended-release metformin       Relevant Orders   Hemoglobin A1c   Moderate mixed hyperlipidemia not requiring statin therapy   Reviewed last lipid panel Not currently on a statin Recheck FLP and CMP Discussed diet and exercise       Relevant Medications   metoprolol  succinate  (TOPROL -XL) 25 MG 24 hr tablet   Other Relevant Orders   Comprehensive metabolic panel with GFR   Lipid panel   Androgen excess   High androgen levels, including testosterone  and DHEA. Symptoms include heavy periods and potential insulin  resistance. Previous spironolactone use resulted in rash. Discussed metformin  for insulin  resistance and potential hormone replacement therapy. - Prescribed extended-release metformin  - Will consider hormone replacement therapy post-menopause      Vasomotor symptoms due to menopause   Experiencing hot flashes and sleep disturbances. Potential benefits of hormone replacement therapy discussed. - Will consider hormone replacement therapy post-menopause      Other Visit Diagnoses       Encounter for annual physical exam    -  Primary     Breast cancer screening by mammogram       Relevant Orders   MM 3D SCREENING MAMMOGRAM BILATERAL BREAST     Cervical cancer screening       Relevant Orders   Cytology - PAP           Woman's Wellness Visit Routine wellness visit with focus on weight management, exercise, and stress reduction. Discussed potential impact of menopause and hormonal changes on weight and overall health. - Ordered mammogram - Performed Pap smear - Ordered labs including cholesterol, kidney and liver function, A1c, and vitamin D  levels         Return in about 2 months (around 09/30/2024) for chronic disease f/u.     Jon Eva, MD  [facility] [phone] (phone) [phone] (fax)  [facility]     [1]  Outpatient Medications Prior to Visit  Medication Sig   acetaminophen  (TYLENOL ) 500 MG tablet Take 2 tablets (1,000 mg total) by mouth every 6 (six) hours as needed for mild pain.   Blood Pressure Monitoring (OMRON 3 SERIES BP MONITOR) DEVI use to check blood pressure   Cholecalciferol (VITAMIN D3) 250 MCG (10000 UT) capsule 1 capsule   clotrimazole -betamethasone   (LOTRISONE ) cream Apply 1 Application topically daily.   ibuprofen  (ADVIL ) 400  MG tablet 1 tablet with food or milk as needed   Multiple Vitamins-Minerals (VITAMIN D3 COMPLETE PO) Take by mouth.   ondansetron  (ZOFRAN ) 4 MG tablet Take 1 tablet (4 mg total) by mouth every 8 (eight) hours as needed for nausea or vomiting. (Patient taking differently: Take 4 mg by mouth as needed for nausea or vomiting.)   [DISCONTINUED] metoprolol  succinate (TOPROL -XL) 25 MG 24 hr tablet Take 1 tablet (25 mg total) by mouth daily.   No facility-administered medications prior to visit.   "

## 2024-08-02 NOTE — Patient Instructions (Signed)
 Call [facility] to schedule a mammogram [phone]

## 2024-08-02 NOTE — Assessment & Plan Note (Signed)
 High androgen levels, including testosterone  and DHEA. Symptoms include heavy periods and potential insulin  resistance. Previous spironolactone use resulted in rash. Discussed metformin  for insulin  resistance and potential hormone replacement therapy. - Prescribed extended-release metformin  - Will consider hormone replacement therapy post-menopause

## 2024-08-02 NOTE — Assessment & Plan Note (Signed)
 Experiencing hot flashes and sleep disturbances. Potential benefits of hormone replacement therapy discussed. - Will consider hormone replacement therapy post-menopause

## 2024-08-02 NOTE — Assessment & Plan Note (Signed)
 Currently managed with metoprolol . Potential impact of beta blockers on weight gain. Discussed alternative antihypertensive options if needed. - Continue metoprolol  - Will consider alternative antihypertensive options if needed

## 2024-08-02 NOTE — Assessment & Plan Note (Signed)
 Reviewed last lipid panel Not currently on a statin Recheck FLP and CMP Discussed diet and exercise

## 2024-08-02 NOTE — Assessment & Plan Note (Signed)
 Previous low vitamin D  levels. No current supplementation. Discussed importance of monitoring vitamin D  levels. - Ordered vitamin D  level

## 2024-08-02 NOTE — Assessment & Plan Note (Signed)
 Well controlled

## 2024-08-02 NOTE — Assessment & Plan Note (Signed)
 Weight maintenance despite diet and exercise. Potential impact of hormonal changes and insulin  resistance on weight. Discussed metformin  for insulin  resistance and weight loss. Consideration of hormone replacement therapy post-menopause. Discussed potential benefits of breast reduction surgery for back and neck pain. - Prescribed extended-release metformin  - Will consider hormone replacement therapy post-menopause - Will discuss potential breast reduction surgery

## 2024-08-03 ENCOUNTER — Other Ambulatory Visit: Payer: Self-pay

## 2024-08-03 ENCOUNTER — Other Ambulatory Visit (HOSPITAL_COMMUNITY): Payer: Self-pay

## 2024-08-03 LAB — COMPREHENSIVE METABOLIC PANEL WITH GFR
ALT: 22 IU/L (ref 0–32)
AST: 18 IU/L (ref 0–40)
Albumin: 4.5 g/dL (ref 3.8–4.9)
Alkaline Phosphatase: 98 IU/L (ref 49–135)
BUN/Creatinine Ratio: 13 (ref 9–23)
BUN: 15 mg/dL (ref 6–24)
Bilirubin Total: 0.5 mg/dL (ref 0.0–1.2)
CO2: 20 mmol/L (ref 20–29)
Calcium: 9.2 mg/dL (ref 8.7–10.2)
Chloride: 103 mmol/L (ref 96–106)
Creatinine, Ser: 1.17 mg/dL — ABNORMAL HIGH (ref 0.57–1.00)
Globulin, Total: 2.3 g/dL (ref 1.5–4.5)
Glucose: 87 mg/dL (ref 70–99)
Potassium: 4 mmol/L (ref 3.5–5.2)
Sodium: 142 mmol/L (ref 134–144)
Total Protein: 6.8 g/dL (ref 6.0–8.5)
eGFR: 55 mL/min/1.73 — ABNORMAL LOW

## 2024-08-03 LAB — VITAMIN D 25 HYDROXY (VIT D DEFICIENCY, FRACTURES): Vit D, 25-Hydroxy: 30.6 ng/mL (ref 30.0–100.0)

## 2024-08-03 LAB — LIPID PANEL
Chol/HDL Ratio: 4.5 ratio — ABNORMAL HIGH (ref 0.0–4.4)
Cholesterol, Total: 218 mg/dL — ABNORMAL HIGH (ref 100–199)
HDL: 48 mg/dL
LDL Chol Calc (NIH): 146 mg/dL — ABNORMAL HIGH (ref 0–99)
Triglycerides: 132 mg/dL (ref 0–149)
VLDL Cholesterol Cal: 24 mg/dL (ref 5–40)

## 2024-08-03 LAB — HEMOGLOBIN A1C
Est. average glucose Bld gHb Est-mCnc: 117 mg/dL
Hgb A1c MFr Bld: 5.7 % — ABNORMAL HIGH (ref 4.8–5.6)

## 2024-08-04 ENCOUNTER — Ambulatory Visit: Payer: Self-pay | Admitting: Family Medicine

## 2024-08-04 ENCOUNTER — Other Ambulatory Visit: Payer: Self-pay

## 2024-08-04 DIAGNOSIS — B9689 Other specified bacterial agents as the cause of diseases classified elsewhere: Secondary | ICD-10-CM

## 2024-08-04 DIAGNOSIS — E782 Mixed hyperlipidemia: Secondary | ICD-10-CM

## 2024-08-04 NOTE — Progress Notes (Signed)
 Last read by Greig ONEIDA Ghent at 10:42AM on 08/04/2024.  Lab order has been placed.

## 2024-08-05 ENCOUNTER — Ambulatory Visit: Attending: Plastic Surgery | Admitting: Physical Therapy

## 2024-08-05 ENCOUNTER — Encounter: Payer: Self-pay | Admitting: Physical Therapy

## 2024-08-05 DIAGNOSIS — N62 Hypertrophy of breast: Secondary | ICD-10-CM | POA: Insufficient documentation

## 2024-08-05 DIAGNOSIS — M25511 Pain in right shoulder: Secondary | ICD-10-CM | POA: Diagnosis present

## 2024-08-05 DIAGNOSIS — R2689 Other abnormalities of gait and mobility: Secondary | ICD-10-CM | POA: Insufficient documentation

## 2024-08-05 DIAGNOSIS — M533 Sacrococcygeal disorders, not elsewhere classified: Secondary | ICD-10-CM | POA: Diagnosis not present

## 2024-08-05 DIAGNOSIS — M546 Pain in thoracic spine: Secondary | ICD-10-CM | POA: Diagnosis present

## 2024-08-05 DIAGNOSIS — G8929 Other chronic pain: Secondary | ICD-10-CM | POA: Diagnosis present

## 2024-08-05 DIAGNOSIS — M5459 Other low back pain: Secondary | ICD-10-CM | POA: Diagnosis present

## 2024-08-05 NOTE — Patient Instructions (Signed)
" °  Lengthen Back rib by R  shoulder   ( winging)  Lie on L  side , pillow between knees and under head  Pull  R arm overhead over mattress, grab the edge of mattress,pull it upward, drawing elbow away from ears  Breathing 20   reps Brushing arm with 3/4 turn onto pillow behind back  Lying on L  side ,Pillow/ Block between knees  dragging top forearm across ribs below breast rotating 3/4 turn,  rotating  _R_ only this week ,  relax onto the pillow behind the back  and then back to other palm , maintain top palm on body whole top and not lift shoulder Do this side this week       Wait do both sides until we have levelled out your spine   "
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
## 2024-08-06 LAB — CYTOLOGY - PAP
Adequacy: ABSENT
Comment: NEGATIVE
Diagnosis: NEGATIVE
High risk HPV: NEGATIVE

## 2024-08-11 ENCOUNTER — Other Ambulatory Visit: Payer: Self-pay

## 2024-08-11 MED ORDER — METRONIDAZOLE 500 MG PO TABS
500.0000 mg | ORAL_TABLET | Freq: Two times a day (BID) | ORAL | 0 refills | Status: AC
  Filled 2024-08-11: qty 14, 7d supply, fill #0

## 2024-08-12 ENCOUNTER — Ambulatory Visit: Admitting: Physical Therapy

## 2024-08-12 DIAGNOSIS — M546 Pain in thoracic spine: Secondary | ICD-10-CM

## 2024-08-12 DIAGNOSIS — R2689 Other abnormalities of gait and mobility: Secondary | ICD-10-CM

## 2024-08-12 DIAGNOSIS — G8929 Other chronic pain: Secondary | ICD-10-CM

## 2024-08-12 DIAGNOSIS — M5459 Other low back pain: Secondary | ICD-10-CM

## 2024-08-13 NOTE — Patient Instructions (Signed)
 Fascial mobilization with palms during R bushing exercise
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
## 2024-08-13 NOTE — Addendum Note (Signed)
 Addended by: PIA FISCAL on: 08/13/2024 08:19 PM   Modules accepted: Orders

## 2024-08-19 ENCOUNTER — Ambulatory Visit: Admitting: Physical Therapy

## 2024-08-19 DIAGNOSIS — R2689 Other abnormalities of gait and mobility: Secondary | ICD-10-CM

## 2024-08-19 DIAGNOSIS — M5459 Other low back pain: Secondary | ICD-10-CM

## 2024-08-19 DIAGNOSIS — G8929 Other chronic pain: Secondary | ICD-10-CM

## 2024-08-19 DIAGNOSIS — M546 Pain in thoracic spine: Secondary | ICD-10-CM
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
## 2024-08-19 NOTE — Patient Instructions (Signed)
 Place band in "U"    band under ballmounds  while laying on back w/ knees bent   Lying on back, knees bent    band under ballmounds  while laying on back w/ knees bent  "W" exercise  20 reps   Band is placed under feet, knees bent, feet are hip width apart Hold band with thumbs point out, keep upper arm and elbow touching the bed the whole time  - inhale and then exhale pull bands by bending elbows hands move in a "w"  (feel shoulder blades squeezing)   ________________

## 2024-08-24 ENCOUNTER — Ambulatory Visit: Admitting: Physical Therapy

## 2024-08-26 ENCOUNTER — Ambulatory Visit: Admitting: Physical Therapy

## 2024-08-26 DIAGNOSIS — G8929 Other chronic pain: Secondary | ICD-10-CM

## 2024-08-26 DIAGNOSIS — M546 Pain in thoracic spine: Secondary | ICD-10-CM

## 2024-08-26 DIAGNOSIS — M5459 Other low back pain: Secondary | ICD-10-CM

## 2024-08-26 DIAGNOSIS — R2689 Other abnormalities of gait and mobility: Secondary | ICD-10-CM
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
## 2024-08-26 NOTE — Patient Instructions (Signed)
 DURING THE DAY   _wall stretch Clock stretch with head turns :  stand perpendicular to the wall, L side to the wall Tilt head to wall  Place L palm at 7 o clock Chin tuck like you are looking into armpit Look at [place]  on giant map  Swivel head with chin tucked to look in upper corner of ceiling as if you are look at  [place] on giant map    15 reps   Switch direction, R palm on wall at 5 o 'clock   Chin tuck like you are looking into armpit Look at [facility]  on giant map  Swivel head with chin tucked to look in upper corner of ceiling as if you are look at  [facility] on giant map    15 reps    ___  Reach up against the wall  Do half circles against the wall  10 reps    __  Dolphin squats on wall   Fingers interlaced, elbows shoulder width apart, forearms in a triangle pressing against the wall Mini squat position   Inhale, exhale chin tucked, shoulders lengthen down from ears, as you rise up not locking knees, hairline brushes by thumbs ( like dolphin snout diving up out of the water  15 reps.  Make sure to not let the front ribs flare out, ribs over the pelvis aligned to not have a swayed back  Pressure through ballmounds to rise up, and dont lock the knees     -Seated pigeon Sit at 45 deg turn with R leg and knee on edge of chair/ bench, L buttock hanging off the edge to bring the L foot back like a lunge, toes bent, lower heel to feel quad stretch,  pay attention to keeping pinky and first toe ballmound planted to align toes forward so ankles are not twerked   Repeat with other side   __

## 2024-08-27 ENCOUNTER — Ambulatory Visit
Admission: RE | Admit: 2024-08-27 | Discharge: 2024-08-27 | Disposition: A | Source: Ambulatory Visit | Attending: Family Medicine

## 2024-08-27 DIAGNOSIS — Z1231 Encounter for screening mammogram for malignant neoplasm of breast: Secondary | ICD-10-CM | POA: Insufficient documentation

## 2024-08-30 ENCOUNTER — Encounter: Admitting: Family Medicine

## 2024-09-01 ENCOUNTER — Ambulatory Visit: Admitting: Physical Therapy

## 2024-09-02 ENCOUNTER — Ambulatory Visit: Admitting: Physical Therapy

## 2024-09-02 DIAGNOSIS — M5459 Other low back pain: Secondary | ICD-10-CM

## 2024-09-02 DIAGNOSIS — M546 Pain in thoracic spine: Secondary | ICD-10-CM

## 2024-09-02 DIAGNOSIS — G8929 Other chronic pain: Secondary | ICD-10-CM

## 2024-09-02 DIAGNOSIS — R2689 Other abnormalities of gait and mobility: Secondary | ICD-10-CM
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
## 2024-09-02 NOTE — Patient Instructions (Signed)
 Lengthen Back rib by L  shoulder ( winging)  Lie on R  side , pillow between knees and under head  Pull  L arm overhead over mattress, grab the edge of mattress,pull it upward, drawing elbow away from ears  Breathing 10 reps Brushing arm with 3/4 turn onto pillow behind back  Lying on R  side ,Pillow/ Block between knees  dragging top forearm across ribs below breast rotating 3/4 turn,  rotating  _L_ only this week ,  relax onto the pillow behind the back  and then back to other palm , maintain top palm on body whole top and not lift shoulder Do this side this week       Wait do both sides until we have levelled out your spine and shoulders ___ Lengthen Back rib by R  shoulder   ( winging)  Lie on L  side , pillow between knees and under head  Pull  R arm overhead over mattress, grab the edge of mattress,pull it upward, drawing elbow away from ears  Breathing 10 reps Brushing arm with 3/4 turn onto pillow behind back  Lying on L  side ,Pillow/ Block between knees  dragging top forearm across ribs below breast rotating 3/4 turn,  rotating  _R_ only this week ,  relax onto the pillow behind the back  and then back to other palm , maintain top palm on body whole top and not lift shoulder Do this side this week       Wait do both sides until we have levelled out your spine   ____________  ZigZag stretch  Reclined twist for hips and side of the hips/ legs  Lay on your back, knees bend Scoot hips to the R , leave shoulders in place Wobble knees to the L side 45 deg and to midline  10 reps    For 5 min  resting onto pillows to keep leg at the same width of hips Pillow under L thigh to minimize too much strain    ________   Karolynn pose rocking  Toes tucked, shoulders down and back, on forearms , hands shoulder width apart, For pelvic floor stretch: wider knees, toes tucked   fingers straight, elbow back , squeeze imaginary pencils in armpit, shoulder down and away from  ears 10 reps    3 way childs pose ( center, to the R, to the L)  _______________________ Knees hip width apart with hands under shoulder  Table top position , Move R hand back a little towards hip when doing :     Modified thread the needle     - drag L forearm under , maintain contact of forearm on bed and do not put the temple neck and head down like the typical pose so this movement is targetting the midback loosening and not pkacing strain on the neck          _______________ Knees hip width apart with hands under shoulder Table top position, Move R hand right below the shoulder when doing :     L hand on L hip, rotate and look up at the ceiling L  ( for this week to realign spine)    Later, you can do both sides __    kitchen counter stretches  Hands on kitchen counter,   Palms shoulder width apart  Minisquat postion Trunk is parallel to floor  A) Pull buttocks back to lengthen spine, knees bent  3 breaths   B)  Bring R hand to the L, and stretch the R side trunk  3 breaths   Brings hands to center again Do the same to the L side stretch by placing L hand on top of R    C) L hand on L hip, rotate rib and look up at the ceiling L  ( for this week to realign spine)    Later, you can do both sides  __

## 2024-09-06 ENCOUNTER — Encounter: Admitting: Family Medicine

## 2024-09-08 ENCOUNTER — Ambulatory Visit: Admitting: Physical Therapy

## 2024-09-09 ENCOUNTER — Ambulatory Visit: Admitting: Physical Therapy

## 2024-09-15 ENCOUNTER — Ambulatory Visit: Admitting: Physical Therapy

## 2024-09-16 ENCOUNTER — Ambulatory Visit: Admitting: Physical Therapy

## 2024-09-22 ENCOUNTER — Ambulatory Visit: Admitting: Physical Therapy

## 2024-09-27 ENCOUNTER — Ambulatory Visit: Payer: 59 | Admitting: Family Medicine

## 2024-09-29 ENCOUNTER — Ambulatory Visit: Admitting: Physical Therapy

## 2024-09-30 ENCOUNTER — Ambulatory Visit: Admitting: Physical Therapy

## 2024-10-04 ENCOUNTER — Ambulatory Visit: Admitting: Family Medicine

## 2024-10-06 ENCOUNTER — Ambulatory Visit: Admitting: Physical Therapy

## 2024-10-07 ENCOUNTER — Ambulatory Visit: Admitting: Physical Therapy

## 2024-10-13 ENCOUNTER — Ambulatory Visit: Admitting: Physical Therapy

## 2024-10-14 ENCOUNTER — Ambulatory Visit: Admitting: Physical Therapy
